# Patient Record
Sex: Female | Born: 1940 | Race: White | Hispanic: No | Marital: Married | Smoking: Never smoker
Health system: Southern US, Community
[De-identification: ages and names within clinical notes are randomized; demographics above are authoritative.]

## PROBLEM LIST (undated history)

## (undated) DIAGNOSIS — J189 Pneumonia, unspecified organism: Secondary | ICD-10-CM

## (undated) DIAGNOSIS — I4891 Unspecified atrial fibrillation: Secondary | ICD-10-CM

## (undated) DIAGNOSIS — D649 Anemia, unspecified: Secondary | ICD-10-CM

## (undated) DIAGNOSIS — G252 Other specified forms of tremor: Secondary | ICD-10-CM

## (undated) DIAGNOSIS — J9610 Chronic respiratory failure, unspecified whether with hypoxia or hypercapnia: Secondary | ICD-10-CM

## (undated) DIAGNOSIS — I509 Heart failure, unspecified: Secondary | ICD-10-CM

## (undated) DIAGNOSIS — G609 Hereditary and idiopathic neuropathy, unspecified: Secondary | ICD-10-CM

## (undated) DIAGNOSIS — E119 Type 2 diabetes mellitus without complications: Secondary | ICD-10-CM

## (undated) DIAGNOSIS — M1990 Unspecified osteoarthritis, unspecified site: Secondary | ICD-10-CM

## (undated) DIAGNOSIS — I890 Lymphedema, not elsewhere classified: Secondary | ICD-10-CM

## (undated) DIAGNOSIS — Z9989 Dependence on other enabling machines and devices: Secondary | ICD-10-CM

## (undated) DIAGNOSIS — R809 Proteinuria, unspecified: Secondary | ICD-10-CM

## (undated) DIAGNOSIS — D126 Benign neoplasm of colon, unspecified: Secondary | ICD-10-CM

## (undated) DIAGNOSIS — G4700 Insomnia, unspecified: Secondary | ICD-10-CM

## (undated) DIAGNOSIS — G2581 Restless legs syndrome: Secondary | ICD-10-CM

## (undated) DIAGNOSIS — N390 Urinary tract infection, site not specified: Secondary | ICD-10-CM

## (undated) DIAGNOSIS — G4733 Obstructive sleep apnea (adult) (pediatric): Secondary | ICD-10-CM

## (undated) DIAGNOSIS — J449 Chronic obstructive pulmonary disease, unspecified: Secondary | ICD-10-CM

## (undated) DIAGNOSIS — IMO0002 Reserved for concepts with insufficient information to code with codable children: Secondary | ICD-10-CM

## (undated) DIAGNOSIS — I10 Essential (primary) hypertension: Secondary | ICD-10-CM

## (undated) DIAGNOSIS — J9690 Respiratory failure, unspecified, unspecified whether with hypoxia or hypercapnia: Secondary | ICD-10-CM

## (undated) DIAGNOSIS — R609 Edema, unspecified: Secondary | ICD-10-CM

## (undated) DIAGNOSIS — Z9981 Dependence on supplemental oxygen: Secondary | ICD-10-CM

## (undated) DIAGNOSIS — E785 Hyperlipidemia, unspecified: Secondary | ICD-10-CM

## (undated) DIAGNOSIS — Z22322 Carrier or suspected carrier of Methicillin resistant Staphylococcus aureus: Secondary | ICD-10-CM

## (undated) HISTORY — DX: Restless legs syndrome: G25.81

## (undated) HISTORY — DX: Insomnia, unspecified: G47.00

## (undated) HISTORY — PX: SHOULDER ARTHROSCOPY W/ ROTATOR CUFF REPAIR: SHX2400

## (undated) HISTORY — DX: Carrier or suspected carrier of methicillin resistant Staphylococcus aureus: Z22.322

## (undated) HISTORY — PX: ABDOMINAL HYSTERECTOMY: SHX81

## (undated) HISTORY — DX: Urinary tract infection, site not specified: N39.0

## (undated) HISTORY — DX: Hereditary and idiopathic neuropathy, unspecified: G60.9

## (undated) HISTORY — DX: Unspecified atrial fibrillation: I48.91

## (undated) HISTORY — DX: Other specified forms of tremor: G25.2

## (undated) HISTORY — DX: Hyperlipidemia, unspecified: E78.5

## (undated) HISTORY — DX: Chronic respiratory failure, unspecified whether with hypoxia or hypercapnia: J96.10

## (undated) HISTORY — DX: Benign neoplasm of colon, unspecified: D12.6

## (undated) HISTORY — PX: LAPAROSCOPIC CHOLECYSTECTOMY: SUR755

## (undated) HISTORY — DX: Essential (primary) hypertension: I10

## (undated) HISTORY — PX: KNEE ARTHROSCOPY: SHX127

## (undated) HISTORY — DX: Reserved for concepts with insufficient information to code with codable children: IMO0002

## (undated) HISTORY — DX: Lymphedema, not elsewhere classified: I89.0

## (undated) HISTORY — DX: Pneumonia, unspecified organism: J18.9

## (undated) HISTORY — DX: Respiratory failure, unspecified, unspecified whether with hypoxia or hypercapnia: J96.90

## (undated) HISTORY — DX: Anemia, unspecified: D64.9

## (undated) HISTORY — PX: CATARACT EXTRACTION W/ INTRAOCULAR LENS  IMPLANT, BILATERAL: SHX1307

## (undated) HISTORY — DX: Heart failure, unspecified: I50.9

## (undated) HISTORY — DX: Proteinuria, unspecified: R80.9

## (undated) HISTORY — DX: Edema, unspecified: R60.9

---

## 2003-11-23 ENCOUNTER — Inpatient Hospital Stay (HOSPITAL_COMMUNITY): Admission: RE | Admit: 2003-11-23 | Discharge: 2003-11-27 | Payer: Self-pay | Admitting: Neurosurgery

## 2004-09-19 ENCOUNTER — Ambulatory Visit (HOSPITAL_COMMUNITY): Admission: RE | Admit: 2004-09-19 | Discharge: 2004-09-19 | Payer: Self-pay | Admitting: Neurosurgery

## 2004-10-03 ENCOUNTER — Ambulatory Visit (HOSPITAL_COMMUNITY): Admission: RE | Admit: 2004-10-03 | Discharge: 2004-10-04 | Payer: Self-pay | Admitting: Neurosurgery

## 2006-05-05 IMAGING — CR DG CHEST 2V
2 series · 2 of 2 positions shown · non-contrast
Comparison: none

CLINICAL DATA: Shortness of breath/pre-admit for lumbar laminectomy. 
TWO VIEW CHEST
No priors for comparison. 
Heart size upper normal. Lungs mildly hyperaerated but clear.  Slight peribronchial thickening. No active disease.                                          impression:no active disease.

[view not recorded (1 of 2)]
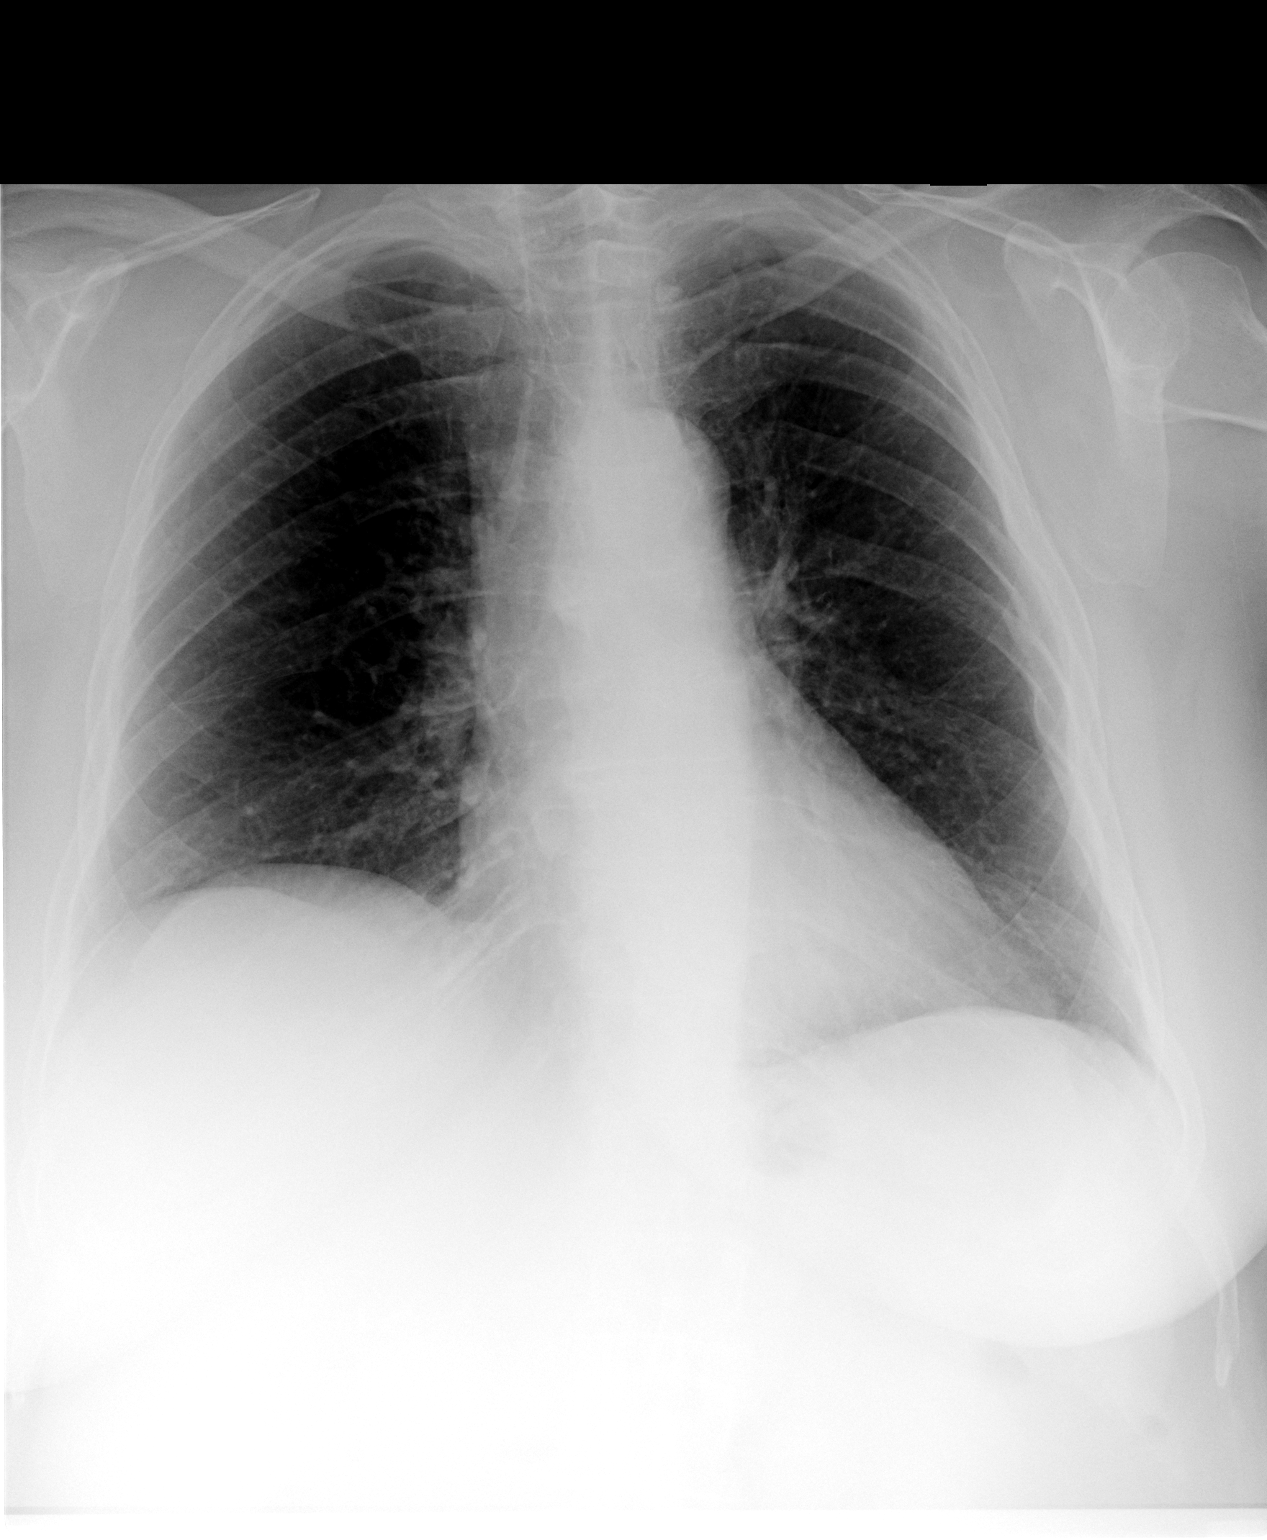

[view not recorded (2 of 2)]
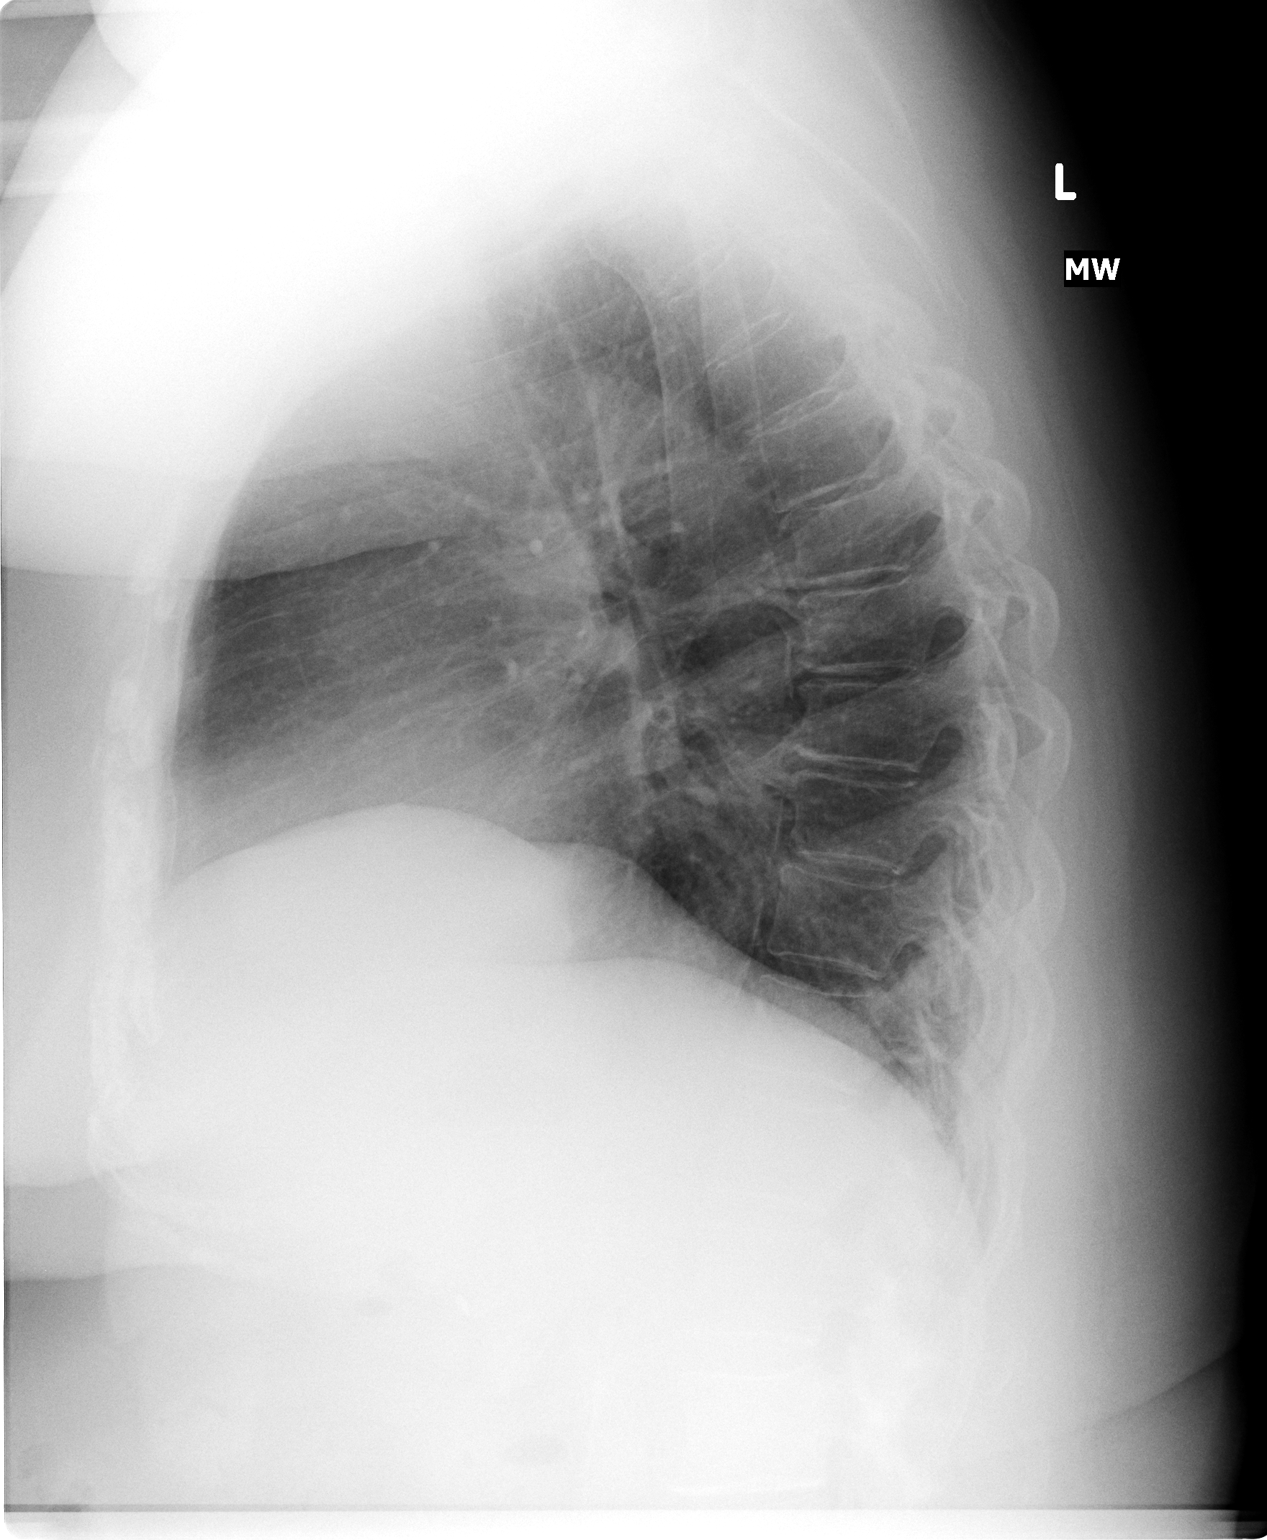

[2 of 2 positions shown; findings below may reference images not displayed]

## 2006-05-07 IMAGING — CR DG CHEST 1V PORT
1 series · 1 of 1 positions shown · non-contrast
Comparison: none

CLINICAL DATA: Central line placement.
 CHEST  PORTABLE 1 VIEW - 11/23/03 AT 6565 HOURS
 A right central line has been placed.  The tip is in the SVC.  No definite pneumothorax.  There are extremely low lung volumes with bibasilar opacities and perihilar opacities most likely volume overload/edema.
 IMPRESSION
 1.  Right central line tip in the SVC.  No pneumothorax.
 2.  Bilateral perihilar and lower lobe opacities most likely edema.

[view not recorded]
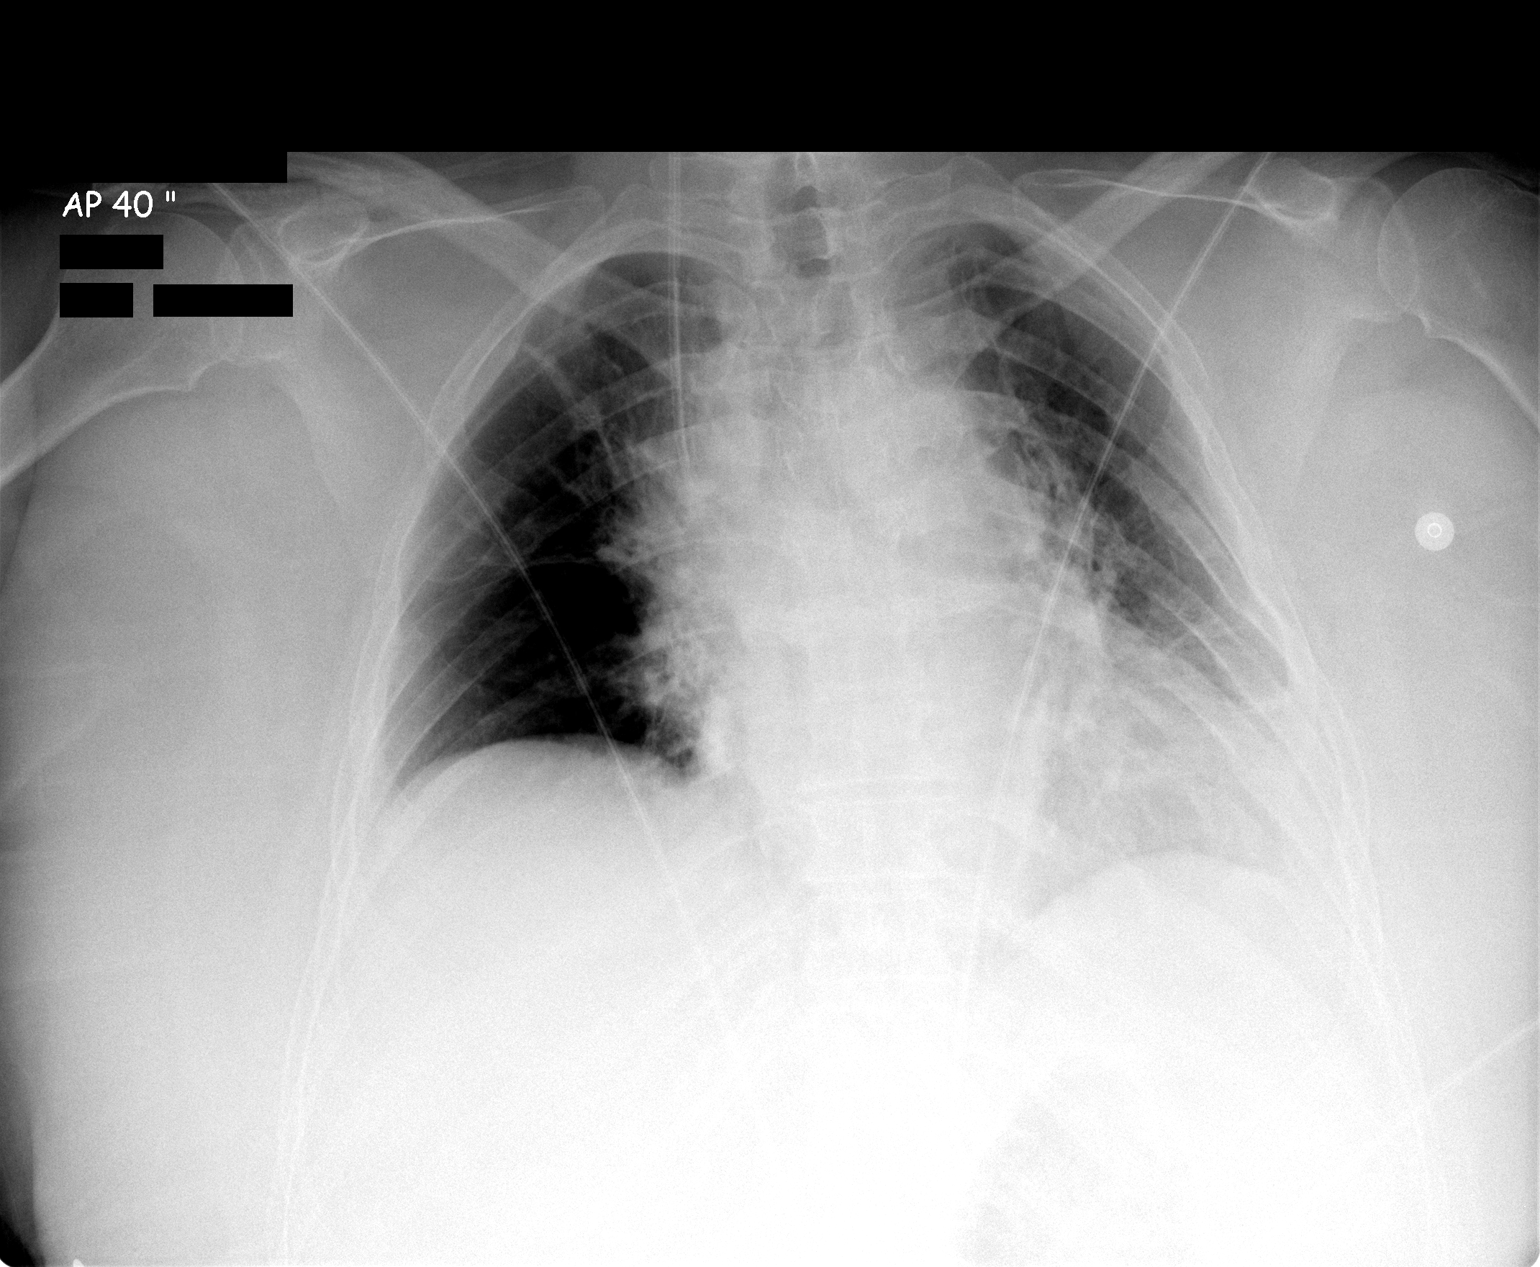

[1 of 1 positions shown; findings below may reference images not displayed]

## 2007-09-25 ENCOUNTER — Ambulatory Visit: Payer: Self-pay | Admitting: Vascular Surgery

## 2010-10-09 NOTE — Procedures (Signed)
LOWER EXTREMITY ARTERIAL EVALUATION-SINGLE LEVEL   INDICATION:  Bilateral leg pain.   HISTORY:  Diabetes:  Yes.  Cardiac:  No.  Hypertension:  Yes.  Smoking:  No.  Previous Surgery:  No.   RESTING SYSTOLIC PRESSURES: (ABI)                          RIGHT                LEFT  Brachial:               124                  130  Anterior tibial:        134                  98  Posterior tibial:       134 (>1.0)           110 (0.85)  Peroneal:  DOPPLER WAVEFORM ANALYSIS:  Anterior tibial:        Triphasic            Triphasic  Posterior tibial:       Triphasic            Triphasic  Peroneal:   PREVIOUS ABI'S:  Date:  RIGHT:  LEFT:   IMPRESSION:  1. No evidence of right lower extremity arterial occlusive disease.  2. Mild left arterial occlusive disease.  3. Right great toe pressure was 64 mmHg.  4. Left great toe pressure was 60 mmHg.  5. Patient unable to ambulate well enough to exercise.     ___________________________________________  Di Kindle. Edilia Bo, M.D.   DP/MEDQ  D:  09/25/2007  T:  09/25/2007  Job:  [id number]

## 2010-10-12 NOTE — Op Note (Signed)
NAMEANIJA, BRICKNER NO.:  [id number]   MEDICAL RECORD NO.:  [id number]          PATIENT TYPE:  OIB   LOCATION:  3025                         FACILITY:  [facility]   PHYSICIAN:  Coletta Memos, M.D.     DATE OF BIRTH:  07-22-1940   DATE OF PROCEDURE:  10/03/2004  DATE OF DISCHARGE:                                 OPERATIVE REPORT   PREOPERATIVE DIAGNOSIS:  1.  Displaced disk left C5-6.  2.  Left cervical radiculopathy.   POSTOPERATIVE DIAGNOSES:  1.  Displaced disk left C5-6.  2.  Left cervical radiculopathy.   PROCEDURE:  1.  Anterior cervical decompression C5-6.  2.  Arthrodesis C5-6, 6 mm Synthes ACS allograft.  3.  Anterior plating, 14 mm Synthes plating system, four 14-mm screws      placed.   COMPLICATIONS:  None.   SURGEON:  Coletta Memos, M.D.   ASSISTANT SURGEON:  Danae Orleans. Venetia Maxon, M.D.   ANESTHESIA:  General endotracheal.   ESTIMATED BLOOD LOSS:  Approximately 30 mL.   COMPLICATIONS:  None.   INDICATIONS:  Ms. Faelyn Sigler is a 64-year woman who has had severe pain  in left upper extremity. A myelogram and post myelogram CT was performed  showing a soft disk, what appeared to be a soft disk and the left C5-6  foramen. I recommended and she agreed to undergo operative decompression.   OPERATIVE NOTE:  Ms. Edds was brought to the operating room, intubated and  placed under general anesthetic. She was placed on a horseshoe with head in  essentially neutral position. 10 pounds of traction applied via chin strap.  The neck was prepped and she was draped in sterile fashion. I infiltrated 4  mL 0.5% lidocaine 1:200,000 strength epinephrine. Opened the skin with #10  blade starting from the midline extending to the medial border of the left  sternocleidomastoid muscle. I then identified the platysma and opened that  in a horizontal fashion. Dissecting both above and below the platysma. I  created more laxity in the skin. I then was able to go  through an avascular  cautery to the anterior cervical spine between the sternocleidomastoid and  medial strap muscles. The carotid artery was retracted laterally and the  strap muscles retracted medially. I reflected the longus colli muscles  bilaterally over the C5-6 disk space. This was done after I had to place the  needle and that showed the needle to be at the C3-4 space. Secondary to the  size of her breasts and shoulders it was not possible to see the C5-6 disk  space. Then counted down two disk spaces from that level and opened the disk  space. I placed distraction pins one at C5 and the other at C6 and self-  retaining retractors. I opened the disk space and with pituitary rongeurs,  curettes and a high-speed drill removed both osteophytes and endplate. Along  the disk was also removed. I then was able to fully decompress the left C6  nerve root with a Kerrison punch and a nerve hook. I removed disk material  and had a very nice  exposure of the nerve root in the lateral gutter. I  irrigated the wound. I did make sure that there was adequate decompression  on the right side though she had no complaints on that side. It was  spondylitic. I then placed a 6 mm bone into the disk space. I released the  retraction from the pins and removed the pins. The weight was taken off of  the neck. I then with Dr. Fredrich Birks assistance placed the plate two screws in  C5, two screws in C6 overlying the C5-6 disk space. I irrigated the wound  again. Then closed with a layered fashion using Vicryl sutures  reapproximating the platysma and subcutaneous tissue. An x-ray was performed  but the plate was not visible and I was unable to confirm my 5-6 location by  that x-ray. The patient tolerated the procedure well, moving all extremities  well.      KC/MEDQ  D:  10/03/2004  T:  10/04/2004  Job:  [id number]

## 2010-10-12 NOTE — Op Note (Signed)
NAMEMANA, HABERL NO.:  [id number]   MEDICAL RECORD NO.:  [id number]          PATIENT TYPE:  OIB   LOCATION:  3025                         FACILITY:  [facility]   PHYSICIAN:  Coletta Memos, M.D.     DATE OF BIRTH:  1940-10-08   DATE OF PROCEDURE:  10/03/2004  DATE OF DISCHARGE:  10/04/2004                                 OPERATIVE REPORT   PREOPERATIVE DIAGNOSIS:  Herniated nucleus pulposus, C5-C6, left.   POSTOPERATIVE DIAGNOSIS:  Herniated nucleus pulposus, C5-C6, left.   PROCEDURE:  Anterior cervical decompression C5-C6, arthrodesis C5-C6 with 6  mm ACF bone graft, Synthes plate, 14 mm using four screws.   COMPLICATIONS:  None.   SURGEON:  Coletta Memos, M.D.   ASSISTANT:  Danae Orleans. Venetia Maxon, M.D.   INDICATIONS FOR PROCEDURE:  Meagan Campbell is a 70 year old patient of mine  who was having pain in the left upper extremity.  Myelogram and post  myelogram CT revealed a herniated disc at C5-C6 on the left side.  I,  therefore, recommended and she agreed to undergo operative decompression.   OPERATIVE NOTE:  [name] was brought to the operating room, intubated,  and placed under general anesthesia without difficulty.  Her head was placed  in slight extension on the a horseshoe headrest with 10 pounds of traction  applied to the chin strap.  The neck was prepped and she was draped in a  sterile fashion.  I infiltrated 3 mL of 0.5% Marcaine 1:200,000 epinephrine  strength epinephrine into the cervical region of my incision starting at the  midline and extending to the medial border of the left sternocleidomastoid  muscle.  I opened the skin with a #10 blade, this was after the patient was  prepped and draped.  I then took this incision down to the platysma using  Metzenbaum scissors.  I dissected above the platysma rostral and caudal.  I  opened the platysma in a horizontal fashion and then I dissected rostral and  caudal inferior to the platysma.  I placed a  spinal needle and it showed  that I was at C3-C4.  I then moved down and I was able to localize the C5-C6  space.  I then opened the disc space with a #15 blade.  I then placed two  distraction pins, one at C5 and the other at C6.  I reflected the longus  colli muscles bilaterally.  I placed self-retaining retractor.  I brought  the microscope into position and proceeded with a discectomy at C5-C6.  I  used pituitary rongeurs, Epstein curets, high speed drill, to perform the  discectomy.  I then decompressed, fully, the C6 nerve roots bilaterally and  removed disc material on the left side at C5-C6.  I performed the  foraminotomy insuring that there was no compression on the nerve root.  I  then irrigated the wound profusely.  A 6 mm bone graft was placed and then I  removed the distraction pins.  I then placed an anterior plate, two screws  at C5, two screws at C6, with Dr. Fredrich Birks assistance.  I  then removed the  retractors, took an x-ray, and showed the  plate, plugs, and screws to be in good position.  I then irrigated the wound  once more.  I closed the wound in layered fashion using Vicryl sutures to  reapproximate the platysma and subcutaneous tissue and used Dermabond for a  sterile dressing.  The patient tolerated the procedure well.       KC/MEDQ  D:  11/30/2004  T:  11/30/2004  Job:  [id number]

## 2010-10-12 NOTE — Op Note (Signed)
NAMEICEL, CASTLES NO.:  [id number]   MEDICAL RECORD NO.:  [id number]                   PATIENT TYPE:  INP   LOCATION:  2899                                 FACILITY:  [facility]   PHYSICIAN:  Coletta Memos, M.D.                  DATE OF BIRTH:  [birth year]   DATE OF PROCEDURE:  11/23/2003  DATE OF DISCHARGE:                                 OPERATIVE REPORT   PREOPERATIVE DIAGNOSIS:  Lumbar stenosis, L2 to L5.  Lumbar radiculopathies.   POSTOPERATIVE DIAGNOSIS:  Lumbar stenosis, L2 to L5.  Lumbar  radiculopathies.   OPERATION PERFORMED:  L2-L5 hemilaminectomies, L3-L4 laminectomy.   SURGEON:  Coletta Memos, M.D.   ASSISTANT:  Payton Doughty, M.D.   ANESTHESIA:  General endotracheal.   COMPLICATIONS:  None.   INDICATIONS FOR PROCEDURE:  Meagan Campbell is a 70 year old woman who  presented with classic signs of neurogenic claudication, secondary to lumbar  stenosis.  I therefore recommended and she agreed to undergo operative  decompression.   DESCRIPTION OF PROCEDURE:  [name] was brought to the operating room,  intubated and placed under general anesthetic without difficulty.  I opened  the skin and exposed the lamina in the lumbar spine.  I placed the double-  ended ganglion knife inferior to it and x-ray showed me to be at the L3-4  interlaminar space.  I then exposed the lamina in bilateral fashion from L2  to L5.  I then proceeded with complete laminectomies of L3 and L4 using  Leksell rongeur, high speed air drill and Kerrison punches. The thecal sac  was exposed.  I then performed hemilaminectomies of L2 and L5 to ensure that  there was no compression either rostrally or caudally.  I then performed  foraminotomies for the L2 root, the L3 root, the L4 root and the L5 roots  bilaterally.  I irrigated the wound.  When this was done,  Dr. Channing Mutters who  assisted with the decompression assisted with closure.  We used Vicryl  sutures to reapproximate  the thoracolumbar fascia, subcutaneous tissues and  skin edges.                                               Coletta Memos, M.D.    KC/MEDQ  D:  11/23/2003  T:  11/23/2003  Job:  [id number]

## 2010-10-12 NOTE — Discharge Summary (Signed)
NAMEFINOLA, ROSAL NO.:  [id number]   MEDICAL RECORD NO.:  [id number]                   PATIENT TYPE:  INP   LOCATION:  3025                                 FACILITY:  [facility]   PHYSICIAN:  Hilda Lias, M.D.                DATE OF BIRTH:  02/10/1941   DATE OF ADMISSION:  11/23/2003  DATE OF DISCHARGE:  11/27/2003                                 DISCHARGE SUMMARY   ADMISSION DIAGNOSIS:  Lumbar stenosis L2 to L5.   FINAL DIAGNOSIS:  Lumbar stenosis L2 to L5.   CLINICAL HISTORY:  The patient was admitted by Dr. Franky Macho because of back  pain with radiation down both legs.  X-rays showed stenosis from L2 down to  L5.  Surgery was advised.   LABORATORY DATA:  Normal.   COURSE IN THE HOSPITAL:  The patient was taken to surgery, and bilateral 2,  3, 4, 5 laminectomy and decompression of spinal cord as well as the nerve  root was done by Dr. Franky Macho.  The patient at the beginning was having a bit  of fever, mostly secondary to atelectasis.  Today she afebrile for 48 hours.  She is ambulating.  The wound looks fine, and she wants to go home.   CONDITION ON DISCHARGE:  Improvement.   MEDICATIONS:  Demerol and Diazepam.   DIET:  Same as preop.   ACTIVITY:  Not to drive, not to bend.   FOLLOW UP:  She is going to call Dr. Franky Macho to see him in three to four  weeks.                                                Hilda Lias, M.D.    EB/MEDQ  D:  11/27/2003  T:  11/28/2003  Job:  [id number]

## 2013-08-09 ENCOUNTER — Encounter: Payer: Self-pay | Admitting: Critical Care Medicine

## 2013-08-09 ENCOUNTER — Encounter: Payer: Self-pay | Admitting: *Deleted

## 2013-08-10 ENCOUNTER — Encounter: Payer: Self-pay | Admitting: Critical Care Medicine

## 2013-08-10 ENCOUNTER — Ambulatory Visit (INDEPENDENT_AMBULATORY_CARE_PROVIDER_SITE_OTHER): Payer: Self-pay | Admitting: Critical Care Medicine

## 2013-08-10 VITALS — BP 138/56 | HR 70 | Temp 98.5°F | Ht 68.0 in | Wt 298.0 lb

## 2013-08-10 DIAGNOSIS — Z22322 Carrier or suspected carrier of Methicillin resistant Staphylococcus aureus: Secondary | ICD-10-CM | POA: Insufficient documentation

## 2013-08-10 DIAGNOSIS — IMO0002 Reserved for concepts with insufficient information to code with codable children: Secondary | ICD-10-CM | POA: Insufficient documentation

## 2013-08-10 DIAGNOSIS — G609 Hereditary and idiopathic neuropathy, unspecified: Secondary | ICD-10-CM | POA: Insufficient documentation

## 2013-08-10 DIAGNOSIS — N184 Chronic kidney disease, stage 4 (severe): Secondary | ICD-10-CM | POA: Insufficient documentation

## 2013-08-10 DIAGNOSIS — G4733 Obstructive sleep apnea (adult) (pediatric): Secondary | ICD-10-CM | POA: Insufficient documentation

## 2013-08-10 DIAGNOSIS — J9610 Chronic respiratory failure, unspecified whether with hypoxia or hypercapnia: Secondary | ICD-10-CM | POA: Insufficient documentation

## 2013-08-10 DIAGNOSIS — E1165 Type 2 diabetes mellitus with hyperglycemia: Secondary | ICD-10-CM

## 2013-08-10 DIAGNOSIS — E119 Type 2 diabetes mellitus without complications: Secondary | ICD-10-CM

## 2013-08-10 DIAGNOSIS — N390 Urinary tract infection, site not specified: Secondary | ICD-10-CM

## 2013-08-10 DIAGNOSIS — I5032 Chronic diastolic (congestive) heart failure: Secondary | ICD-10-CM | POA: Insufficient documentation

## 2013-08-10 DIAGNOSIS — I10 Essential (primary) hypertension: Secondary | ICD-10-CM

## 2013-08-10 DIAGNOSIS — I4891 Unspecified atrial fibrillation: Secondary | ICD-10-CM | POA: Insufficient documentation

## 2013-08-10 DIAGNOSIS — E785 Hyperlipidemia, unspecified: Secondary | ICD-10-CM | POA: Insufficient documentation

## 2013-08-10 DIAGNOSIS — E662 Morbid (severe) obesity with alveolar hypoventilation: Secondary | ICD-10-CM

## 2013-08-10 DIAGNOSIS — R809 Proteinuria, unspecified: Secondary | ICD-10-CM

## 2013-08-10 DIAGNOSIS — I509 Heart failure, unspecified: Secondary | ICD-10-CM

## 2013-08-10 DIAGNOSIS — J189 Pneumonia, unspecified organism: Secondary | ICD-10-CM | POA: Insufficient documentation

## 2013-08-10 DIAGNOSIS — D649 Anemia, unspecified: Secondary | ICD-10-CM | POA: Insufficient documentation

## 2013-08-10 DIAGNOSIS — E1129 Type 2 diabetes mellitus with other diabetic kidney complication: Secondary | ICD-10-CM | POA: Insufficient documentation

## 2013-08-10 DIAGNOSIS — G2581 Restless legs syndrome: Secondary | ICD-10-CM | POA: Insufficient documentation

## 2013-08-10 MED ORDER — FUROSEMIDE 40 MG PO TABS: ORAL_TABLET | ORAL | Status: DC

## 2013-08-10 NOTE — Progress Notes (Signed)
Subjective:    Patient ID: Meagan Campbell, female    DOB: 04/09/1941, 73 y.o.   MRN: [id number]  HPI Comments: Pt in hosp 06/2013 for PNA and on vent.  Pt went to [facility] for rehab.  Pt had a vent run 2 years ago as well. Pt fell, and worsened. Pt now on a walker. Pt has been on oxygen night time but now night as well, now 2L continuous   Shortness of Breath This is a chronic problem. The current episode started more than 1 year ago. The problem occurs constantly. The problem has been unchanged. Associated symptoms include leg swelling and orthopnea. Pertinent negatives include no abdominal pain, chest pain, claudication, coryza, ear pain, fever, headaches, hemoptysis, leg pain, neck pain, PND, rash, rhinorrhea, sore throat, sputum production, swollen glands, syncope, vomiting or wheezing. The symptoms are aggravated by any activity, lying flat and URIs. Risk factors: life long never smoker. She has tried beta agonist inhalers for the symptoms. The treatment provided moderate relief. Her past medical history is significant for a heart failure and pneumonia. There is no history of allergies, aspirin allergies, asthma, bronchiolitis, CAD, COPD, DVT, PE or a recent surgery.  Now on Bipap 14/7 full face mask, wears bipap every night  ABG on d/c 06/2013: pco2 62 po2 78     Review of Systems  Constitutional: Positive for appetite change, fatigue and unexpected weight change. Negative for fever, chills, diaphoresis and activity change.  HENT: Positive for congestion. Negative for dental problem, ear discharge, ear pain, facial swelling, hearing loss, mouth sores, nosebleeds, postnasal drip, rhinorrhea, sinus pressure, sneezing, sore throat, tinnitus, trouble swallowing and voice change.   Eyes: Negative for photophobia, discharge, itching and visual disturbance.  Respiratory: Positive for cough, chest tightness and shortness of breath. Negative for apnea, hemoptysis, sputum production, choking, wheezing  and stridor.   Cardiovascular: Positive for palpitations, orthopnea and leg swelling. Negative for chest pain, claudication, syncope and PND.  Gastrointestinal: Negative for nausea, vomiting, abdominal pain, constipation, blood in stool and abdominal distention.  Genitourinary: Negative for dysuria, urgency, frequency, hematuria, flank pain, decreased urine volume and difficulty urinating.  Musculoskeletal: Positive for gait problem. Negative for arthralgias, back pain, joint swelling, myalgias, neck pain and neck stiffness.  Skin: Negative for color change, pallor and rash.  Neurological: Positive for dizziness, tremors and weakness. Negative for seizures, syncope, speech difficulty, light-headedness, numbness and headaches.  Hematological: Negative for adenopathy. Does not bruise/bleed easily.  Psychiatric/Behavioral: Positive for sleep disturbance. Negative for confusion and agitation. The patient is nervous/anxious.        Objective:   Physical Exam Filed Vitals:   08/10/13 1523 08/10/13 1524  BP:  138/56  Pulse:  70  Temp:  98.5 F (36.9 C)  TempSrc:  Oral  Height:  _0  (1.727 m)  Weight:  135.172 kg (298 lb)  SpO2: 87% 91%    Gen: Pleasant, morbidly obese, in no distress,  normal affect  ENT: No lesions,  mouth clear,  oropharynx clear, no postnasal drip  Neck: No JVD, no TMG, no carotid bruits  Lungs: No use of accessory muscles, no dullness to percussion, decreased BS at bases  Cardiovascular: RRR, heart sounds normal, no murmur or gallops, +++ peripheral edema  Abdomen: soft and NT, no HSM,  BS normal  Musculoskeletal: No deformities, no cyanosis or clubbing  Neuro: alert, non focal  Skin: Warm, no lesions or rashes  No results found.        Assessment & Plan:  Obesity hypoventilation syndrome Combined OSA and obesity hypoventilation syndrome with recent PNA and associated acute on chronic resp failure, now improved. Note volume excess and CKD stage  IV Plan Increase furosemide to $RemoveBefor'80mg'stqHgGUWJosO$  AM $R'40mg'Jh$  at lunch $Remove'80mg'owzllzp$  PM for 3 days then reduce to $RemoveB'80mg'GqzXavOX$  twice daily Cont Bilevel and oxygen QHS A renal consultation would be in order No other medication changes Return 2 months   CKD (chronic kidney disease) stage 4, GFR 15-29 ml/min Stage IV CKD A renal consultation should be considered   Urinary tract infection Recent UTI  Per PCP  Chronic diastolic heart failure Recent acute on chronic diastolic CHF, EF now 30% , was lower in prior Echos  Plan Per cardiology and PCP Increase furosemide to $RemoveBefor'80mg'HqqcsgGuvLTK$  AM $R'40mg'Dy$  at lunch $Remove'80mg'QDfqaWx$  PM for 3 days then reduce to $RemoveB'80mg'rEProGtA$  twice daily   Atrial fibrillation Chronic Afib Per cardiology  Morbid obesity Obesity and DM poorly controlled Weight management advised    Updated Medication List Outpatient Encounter Prescriptions as of 08/10/2013  Medication Sig  . ACCU-CHEK AVIVA PLUS test strip Use as directed  . amiodarone (PACERONE) 200 MG tablet Take 200 mg by mouth daily.  Marland Kitchen amLODipine (NORVASC) 5 MG tablet Take 5 mg by mouth daily.  Marland Kitchen aspirin 325 MG tablet Take 325 mg by mouth daily.  . Blood Glucose Calibration (ACCU-CHEK AVIVA) SOLN Use as directed  . Blood Glucose Monitoring Suppl (ACCU-CHEK AVIVA PLUS) W/DEVICE KIT Use as directed  . carbidopa-levodopa (SINEMET IR) 25-250 MG per tablet Take 1 tablet by mouth 3 (three) times daily.  . ferrous sulfate 325 (65 FE) MG tablet Take 325 mg by mouth daily with breakfast.  . furosemide (LASIX) 40 MG tablet Increase to $RemoveBef'80mg'ZfRpHQdlxZ$  AM $R'40mg'Nu$  at lunch and $RemoveB'80mg'cLrRIHaf$  PM for 3 days then reduce back to $Remov'80mg'ExCNyC$  twice daily  . hydrALAZINE (APRESOLINE) 50 MG tablet Take 50 mg by mouth 3 (three) times daily.  Marland Kitchen HYDROcodone-acetaminophen (NORCO/VICODIN) 5-325 MG per tablet Take 1 tablet by mouth every 6 (six) hours as needed for moderate pain.  Marland Kitchen insulin aspart protamine- aspart (NOVOLOG MIX 70/30) (70-30) 100 UNIT/ML injection Inject 35 Units into the skin 2 (two) times daily with a meal.    . insulin regular (HUMULIN R) 100 units/mL injection Inject into the skin 3 (three) times daily before meals.  Marland Kitchen ipratropium-albuterol (DUONEB) 0.5-2.5 (3) MG/3ML SOLN Take 3 mLs by nebulization 2 (two) times daily.   . isosorbide mononitrate (IMDUR) 30 MG 24 hr tablet Take 30 mg by mouth daily.  . magnesium hydroxide (MILK OF MAGNESIA) 400 MG/5ML suspension Take 30 mLs by mouth daily as needed for mild constipation.  . magnesium oxide (MAG-OX) 400 MG tablet Take 400 mg by mouth 2 (two) times daily.  . Omega-3 Fatty Acids (FISH OIL CONCENTRATE PO) Take 2 capsules by mouth 2 (two) times daily.   . ondansetron (ZOFRAN) 4 MG tablet Take 1 tablet by mouth every 4 (four) hours as needed.  . pregabalin (LYRICA) 100 MG capsule Take 100 mg by mouth daily.   Marland Kitchen rOPINIRole (REQUIP) 4 MG tablet Take 4 mg by mouth at bedtime. 2 tabs at bed time  . simvastatin (ZOCOR) 40 MG tablet Take 40 mg by mouth daily.  Marland Kitchen ULTILET CLASSIC LANCETS MISC by Does not apply route.  . vitamin B-12 (CYANOCOBALAMIN) 1000 MCG tablet Take 1,000 mcg by mouth daily.  . [DISCONTINUED] furosemide (LASIX) 40 MG tablet Take 80 mg by mouth 2 (two) times daily.   . [DISCONTINUED] carvedilol (COREG)  6.25 MG tablet Take 6.25 mg by mouth 2 (two) times daily with a meal.  . [DISCONTINUED] meclizine (ANTIVERT) 25 MG tablet Take 25 mg by mouth 3 (three) times daily as needed for dizziness.  . [DISCONTINUED] Menthol, Topical Analgesic, (BIOFREEZE) 4 % GEL Apply topically.  . [DISCONTINUED] metolazone (ZAROXOLYN) 2.5 MG tablet Take 2.5 mg by mouth daily.  . [DISCONTINUED] omeprazole (PRILOSEC) 40 MG capsule Take 40 mg by mouth daily.

## 2013-08-10 NOTE — Patient Instructions (Signed)
A referral to a kidney physician may be beneficial, will discuss with Dr Delena Bali Increase furosemide to 80mg  AM 40mg  at lunch 80mg  PM for 3 days then reduce to 80mg  twice daily No other medication changes Return 2 months

## 2013-08-11 DIAGNOSIS — E662 Morbid (severe) obesity with alveolar hypoventilation: Secondary | ICD-10-CM | POA: Insufficient documentation

## 2013-08-11 NOTE — Assessment & Plan Note (Signed)
Combined OSA and obesity hypoventilation syndrome with recent PNA and associated acute on chronic resp failure, now improved. Note volume excess and CKD stage IV Plan Increase furosemide to 80mg  AM 40mg  at lunch 80mg  PM for 3 days then reduce to 80mg  twice daily Cont Bilevel and oxygen QHS A renal consultation would be in order No other medication changes Return 2 months

## 2013-08-11 NOTE — Assessment & Plan Note (Signed)
Obesity and DM poorly controlled Weight management advised

## 2013-08-11 NOTE — Assessment & Plan Note (Signed)
Chronic Afib Per cardiology

## 2013-08-11 NOTE — Assessment & Plan Note (Signed)
Recent acute on chronic diastolic CHF, EF now 23% , was lower in prior [facility] Per cardiology and PCP Increase furosemide to 80mg  AM 40mg  at lunch 80mg  PM for 3 days then reduce to 80mg  twice daily

## 2013-08-11 NOTE — Assessment & Plan Note (Signed)
Stage IV CKD A renal consultation should be considered

## 2013-08-11 NOTE — Assessment & Plan Note (Signed)
Recent UTI  Per PCP

## 2013-08-26 ENCOUNTER — Ambulatory Visit: Payer: Self-pay

## 2013-11-02 ENCOUNTER — Encounter: Payer: Self-pay | Admitting: Critical Care Medicine

## 2013-11-02 ENCOUNTER — Ambulatory Visit (INDEPENDENT_AMBULATORY_CARE_PROVIDER_SITE_OTHER): Payer: Self-pay | Admitting: Critical Care Medicine

## 2013-11-02 VITALS — BP 138/60 | HR 74 | Temp 97.1°F | Ht 67.0 in | Wt 295.6 lb

## 2013-11-02 DIAGNOSIS — N184 Chronic kidney disease, stage 4 (severe): Secondary | ICD-10-CM

## 2013-11-02 DIAGNOSIS — J9610 Chronic respiratory failure, unspecified whether with hypoxia or hypercapnia: Secondary | ICD-10-CM

## 2013-11-02 DIAGNOSIS — E662 Morbid (severe) obesity with alveolar hypoventilation: Secondary | ICD-10-CM

## 2013-11-02 NOTE — Assessment & Plan Note (Signed)
Chronic resp failure d/t obesity hypoventilation  Plan ABG and bmet  today Home health RN ordered No change in medications, cont lasix as ordered Renal evaluation would be ideal Return 2 months

## 2013-11-02 NOTE — Progress Notes (Signed)
Subjective:    Patient ID: Meagan Campbell, female    DOB: [birth year], 73 y.o.   MRN: [id number]  HPI Comments: Pt in hosp 06/2013 for PNA and on vent.  Pt went to [facility] for rehab.  Pt had a vent run 2 years ago as well. Pt fell, and worsened. Pt now on a walker. Pt has been on oxygen night time but now night as well, now 2L continuous  Now on Bipap 14/7 full face mask, wears bipap every night  ABG on d/c 06/2013: pco2 62 po2 78   11/02/2013 Chief Complaint  Patient presents with  . Follow-up    sob worse x 1 wk.,no cough,no wheezing, denies cp or tightness,increased ankle and legs   combined OSA and obesity hypoventilation syndrome with recent PNA and associated acute on chronic resp failure, now improved. Note volume excess and CKD stage IV Plan Increase furosemide to 29m AM 464mat lunch 8050mM for 3 days then reduce to 57m41mice daily Cont Bilevel and oxygen QHS Pt more dyspneic over the past week.  Legs more edematous.  No change in weight. No real cough. No renal consult yet obtained.  Pt does not want HD. No real chest pain On bilevel qhs and oxygen    Review of Systems  Constitutional: Positive for appetite change, fatigue and unexpected weight change. Negative for chills, diaphoresis and activity change.  HENT: Positive for congestion. Negative for dental problem, ear discharge, facial swelling, hearing loss, mouth sores, nosebleeds, postnasal drip, sinus pressure, sneezing, tinnitus, trouble swallowing and voice change.   Eyes: Negative for photophobia, discharge, itching and visual disturbance.  Respiratory: Positive for cough and chest tightness. Negative for apnea, choking and stridor.   Cardiovascular: Positive for palpitations.  Gastrointestinal: Negative for nausea, constipation, blood in stool and abdominal distention.  Genitourinary: Negative for dysuria, urgency, frequency, hematuria, flank pain, decreased urine volume and difficulty urinating.  Musculoskeletal:  Positive for gait problem. Negative for arthralgias, back pain, joint swelling, myalgias and neck stiffness.  Skin: Negative for color change and pallor.  Neurological: Positive for dizziness, tremors and weakness. Negative for seizures, syncope, speech difficulty, light-headedness and numbness.  Hematological: Negative for adenopathy. Does not bruise/bleed easily.  Psychiatric/Behavioral: Positive for sleep disturbance. Negative for confusion and agitation. The patient is nervous/anxious.        Objective:   Physical Exam  Filed Vitals:   11/02/13 1102  BP: 138/60  Pulse: 74  Temp: 97.1 F (36.2 C)  TempSrc: Oral  Height: _0  (1.702 m)  Weight: 295 lb 9.6 oz (134.083 kg)  SpO2: 92%    Gen: Pleasant, morbidly obese, in no distress,  normal affect  ENT: No lesions,  mouth clear,  oropharynx clear, no postnasal drip  Neck: No JVD, no TMG, no carotid bruits  Lungs: No use of accessory muscles, no dullness to percussion, decreased BS at bases  Cardiovascular: RRR, heart sounds normal, no murmur or gallops, +++ peripheral edema  Abdomen: soft and NT, no HSM,  BS normal  Musculoskeletal: No deformities, no cyanosis or clubbing  Neuro: alert, non focal  Skin: Warm, no lesions or rashes  No results found.        Assessment & Plan:   Chronic respiratory failure Chronic resp failure d/t obesity hypoventilation  Plan ABG and bmet  today Home health RN ordered No change in medications, cont lasix as ordered Renal evaluation would be ideal Return 2 months   CKD (chronic kidney disease) stage 4, GFR  15-29 ml/min Pt claims she does not desire Hemodialysis Needs renal evaluation    Updated Medication List Outpatient Encounter Prescriptions as of 11/02/2013  Medication Sig  . ACCU-CHEK AVIVA PLUS test strip Use as directed  . amiodarone (PACERONE) 200 MG tablet Take 200 mg by mouth daily.  Marland Kitchen amLODipine (NORVASC) 5 MG tablet Take 5 mg by mouth daily.  Marland Kitchen aspirin  325 MG tablet Take 325 mg by mouth daily.  . Blood Glucose Calibration (ACCU-CHEK AVIVA) SOLN Use as directed  . Blood Glucose Monitoring Suppl (ACCU-CHEK AVIVA PLUS) W/DEVICE KIT Use as directed  . carbidopa-levodopa (SINEMET IR) 25-250 MG per tablet Take 1 tablet by mouth 3 (three) times daily.  . ferrous sulfate 325 (65 FE) MG tablet Take 325 mg by mouth daily with breakfast.  . furosemide (LASIX) 40 MG tablet 80 mg 2 (two) times daily.  Marland Kitchen HYDROcodone-acetaminophen (NORCO/VICODIN) 5-325 MG per tablet Take 1 tablet by mouth every 6 (six) hours as needed for moderate pain.  Marland Kitchen insulin aspart protamine- aspart (NOVOLOG MIX 70/30) (70-30) 100 UNIT/ML injection Inject 40 Units into the skin 2 (two) times daily with a meal.   . insulin regular (HUMULIN R) 100 units/mL injection Inject into the skin 3 (three) times daily before meals.  Marland Kitchen ipratropium-albuterol (DUONEB) 0.5-2.5 (3) MG/3ML SOLN Take 3 mLs by nebulization 2 (two) times daily.   . isosorbide mononitrate (IMDUR) 30 MG 24 hr tablet Take 30 mg by mouth daily.  . magnesium hydroxide (MILK OF MAGNESIA) 400 MG/5ML suspension Take 30 mLs by mouth daily as needed for mild constipation.  . magnesium oxide (MAG-OX) 400 MG tablet Take 400 mg by mouth 2 (two) times daily.  . Omega-3 Fatty Acids (FISH OIL CONCENTRATE PO) Take 2 capsules by mouth 2 (two) times daily.   . ondansetron (ZOFRAN) 4 MG tablet Take 1 tablet by mouth every 4 (four) hours as needed.  . pregabalin (LYRICA) 100 MG capsule Take 100 mg by mouth daily.   Marland Kitchen rOPINIRole (REQUIP) 4 MG tablet Take 4 mg by mouth at bedtime. 2 tabs at bed time  . simvastatin (ZOCOR) 40 MG tablet Take 40 mg by mouth daily.  Marland Kitchen ULTILET CLASSIC LANCETS MISC by Does not apply route.  . zaleplon (SONATA) 10 MG capsule Take 10 mg by mouth at bedtime as needed.  . [DISCONTINUED] furosemide (LASIX) 40 MG tablet Increase to 36m AM 416mat lunch and 8060mM for 3 days then reduce back to 71m61mice daily  .  carvedilol (COREG) 6.25 MG tablet Take 6.25 mg by mouth 2 (two) times daily.  . gaMarland Kitchenapentin (NEURONTIN) 300 MG capsule Not started yet  . hydrALAZINE (APRESOLINE) 50 MG tablet Take 50 mg by mouth 3 (three) times daily.  . [DISCONTINUED] vitamin B-12 (CYANOCOBALAMIN) 1000 MCG tablet Take 1,000 mcg by mouth daily.

## 2013-11-02 NOTE — Patient Instructions (Signed)
ABG and labs today Home health RN ordered No change in medications Return 2 months

## 2013-11-02 NOTE — Assessment & Plan Note (Signed)
Pt claims she does not desire Hemodialysis Needs renal evaluation

## 2013-11-03 ENCOUNTER — Telehealth: Payer: Self-pay | Admitting: Critical Care Medicine

## 2013-11-03 NOTE — Telephone Encounter (Signed)
ATC line busy x 4 wcb 

## 2013-11-04 NOTE — Telephone Encounter (Signed)
Crystal, can you check to see what bmet result was : [employer]

## 2013-11-04 NOTE — Telephone Encounter (Signed)
Don't see any results in epic regarding lab work Please advise Dr. Joya Gaskins if you have any results on pt? thanks

## 2013-11-04 NOTE — Telephone Encounter (Signed)
Wants results of her labs [phone]

## 2013-11-04 NOTE — Telephone Encounter (Signed)
Will do  In the future it would help to make a note of key findings of the labs since [place] labs are faxed

## 2013-11-04 NOTE — Telephone Encounter (Signed)
I called spoke with pt. She is aware PW will be in [facility] office tomorrow and will let know results then. Please advise thanks

## 2013-11-04 NOTE — Telephone Encounter (Signed)
Results received and is in PW folder for review. Please advise Dr. Joya Gaskins thanks

## 2013-11-05 ENCOUNTER — Telehealth: Payer: Self-pay | Admitting: Critical Care Medicine

## 2013-11-05 NOTE — Telephone Encounter (Signed)
Will forward to Dr. Joya Gaskins and Donella Stade regarding results

## 2013-11-05 NOTE — Telephone Encounter (Signed)
Pt aware.

## 2013-11-09 NOTE — Telephone Encounter (Signed)
Opened in error

## 2013-11-24 DEATH — deceased

## 2013-11-25 ENCOUNTER — Encounter: Payer: Self-pay | Admitting: Critical Care Medicine

## 2013-12-30 ENCOUNTER — Ambulatory Visit: Payer: Self-pay

## 2014-01-04 ENCOUNTER — Ambulatory Visit: Payer: Self-pay | Admitting: Critical Care Medicine

## 2014-03-24 ENCOUNTER — Telehealth: Payer: Self-pay | Admitting: Critical Care Medicine

## 2014-03-24 NOTE — Telephone Encounter (Signed)
Received labs, 3 pages, from [place], sent to Dr. Joya Gaskins. 03/24/14/ss.

## 2014-05-13 ENCOUNTER — Encounter: Payer: Self-pay | Admitting: Critical Care Medicine

## 2014-05-31 ENCOUNTER — Ambulatory Visit: Payer: Self-pay | Admitting: Critical Care Medicine

## 2014-06-02 DIAGNOSIS — J449 Chronic obstructive pulmonary disease, unspecified: Secondary | ICD-10-CM | POA: Diagnosis not present

## 2014-06-02 DIAGNOSIS — J9600 Acute respiratory failure, unspecified whether with hypoxia or hypercapnia: Secondary | ICD-10-CM | POA: Diagnosis not present

## 2014-06-06 DIAGNOSIS — J449 Chronic obstructive pulmonary disease, unspecified: Secondary | ICD-10-CM | POA: Diagnosis not present

## 2014-06-16 DIAGNOSIS — J9811 Atelectasis: Secondary | ICD-10-CM | POA: Diagnosis not present

## 2014-06-16 DIAGNOSIS — I517 Cardiomegaly: Secondary | ICD-10-CM | POA: Diagnosis not present

## 2014-06-16 DIAGNOSIS — J449 Chronic obstructive pulmonary disease, unspecified: Secondary | ICD-10-CM | POA: Diagnosis not present

## 2014-06-24 DIAGNOSIS — I10 Essential (primary) hypertension: Secondary | ICD-10-CM | POA: Diagnosis not present

## 2014-06-24 DIAGNOSIS — D649 Anemia, unspecified: Secondary | ICD-10-CM | POA: Diagnosis not present

## 2014-06-24 DIAGNOSIS — N184 Chronic kidney disease, stage 4 (severe): Secondary | ICD-10-CM | POA: Diagnosis not present

## 2014-06-24 DIAGNOSIS — E213 Hyperparathyroidism, unspecified: Secondary | ICD-10-CM | POA: Diagnosis not present

## 2014-06-24 DIAGNOSIS — I509 Heart failure, unspecified: Secondary | ICD-10-CM | POA: Diagnosis not present

## 2014-06-27 DIAGNOSIS — E1129 Type 2 diabetes mellitus with other diabetic kidney complication: Secondary | ICD-10-CM | POA: Diagnosis not present

## 2014-06-27 DIAGNOSIS — E611 Iron deficiency: Secondary | ICD-10-CM | POA: Diagnosis not present

## 2014-06-27 DIAGNOSIS — I10 Essential (primary) hypertension: Secondary | ICD-10-CM | POA: Diagnosis not present

## 2014-06-27 DIAGNOSIS — Z79899 Other long term (current) drug therapy: Secondary | ICD-10-CM | POA: Diagnosis not present

## 2014-06-27 DIAGNOSIS — E785 Hyperlipidemia, unspecified: Secondary | ICD-10-CM | POA: Diagnosis not present

## 2014-06-28 ENCOUNTER — Other Ambulatory Visit: Payer: Self-pay | Admitting: Nephrology

## 2014-06-28 ENCOUNTER — Ambulatory Visit (INDEPENDENT_AMBULATORY_CARE_PROVIDER_SITE_OTHER): Payer: Self-pay | Admitting: Critical Care Medicine

## 2014-06-28 ENCOUNTER — Encounter: Payer: Self-pay | Admitting: Critical Care Medicine

## 2014-06-28 VITALS — BP 136/60 | HR 91 | Temp 97.6°F | Ht 68.0 in | Wt 288.0 lb

## 2014-06-28 DIAGNOSIS — G8929 Other chronic pain: Secondary | ICD-10-CM | POA: Insufficient documentation

## 2014-06-28 DIAGNOSIS — E669 Obesity, unspecified: Secondary | ICD-10-CM | POA: Insufficient documentation

## 2014-06-28 DIAGNOSIS — E662 Morbid (severe) obesity with alveolar hypoventilation: Secondary | ICD-10-CM | POA: Diagnosis not present

## 2014-06-28 DIAGNOSIS — E1149 Type 2 diabetes mellitus with other diabetic neurological complication: Secondary | ICD-10-CM | POA: Insufficient documentation

## 2014-06-28 DIAGNOSIS — E1165 Type 2 diabetes mellitus with hyperglycemia: Secondary | ICD-10-CM | POA: Insufficient documentation

## 2014-06-28 DIAGNOSIS — I10 Essential (primary) hypertension: Secondary | ICD-10-CM

## 2014-06-28 DIAGNOSIS — J9612 Chronic respiratory failure with hypercapnia: Secondary | ICD-10-CM | POA: Diagnosis not present

## 2014-06-28 DIAGNOSIS — IMO0002 Reserved for concepts with insufficient information to code with codable children: Secondary | ICD-10-CM | POA: Insufficient documentation

## 2014-06-28 DIAGNOSIS — M1710 Unilateral primary osteoarthritis, unspecified knee: Secondary | ICD-10-CM | POA: Insufficient documentation

## 2014-06-28 DIAGNOSIS — M5410 Radiculopathy, site unspecified: Secondary | ICD-10-CM | POA: Insufficient documentation

## 2014-06-28 DIAGNOSIS — D649 Anemia, unspecified: Secondary | ICD-10-CM

## 2014-06-28 DIAGNOSIS — I509 Heart failure, unspecified: Secondary | ICD-10-CM

## 2014-06-28 DIAGNOSIS — N184 Chronic kidney disease, stage 4 (severe): Secondary | ICD-10-CM

## 2014-06-28 DIAGNOSIS — M5136 Other intervertebral disc degeneration, lumbar region: Secondary | ICD-10-CM | POA: Insufficient documentation

## 2014-06-28 DIAGNOSIS — E1140 Type 2 diabetes mellitus with diabetic neuropathy, unspecified: Secondary | ICD-10-CM

## 2014-06-28 DIAGNOSIS — E1129 Type 2 diabetes mellitus with other diabetic kidney complication: Secondary | ICD-10-CM | POA: Diagnosis not present

## 2014-06-28 DIAGNOSIS — M179 Osteoarthritis of knee, unspecified: Secondary | ICD-10-CM | POA: Insufficient documentation

## 2014-06-28 NOTE — Progress Notes (Signed)
Subjective:    Patient ID: Meagan Campbell, female    DOB: Jul 18, 1940, 74 y.o.   MRN: [id number]  HPI Comments: Pt in hosp 06/2013 for PNA and on vent.  Pt went to [facility] for rehab.  Pt had a vent run 2 years ago as well. Pt fell, and worsened. Pt now on a walker. Pt has been on oxygen night time but now night as well, now 2L continuous  Now on Bipap 14/7 full face mask, wears bipap every night  ABG on d/c 06/2013: pco2 62 po2 78   06/28/2014 Chief Complaint  Patient presents with  . Follow-up    DOE unchanged, No cough, wheezing, or chest tightness/CP.  Notes DOE only.  Ok at rest.  No cough or mucus.  Still with edema.  Now on 169m bid lasix.  No potassium supp, pt with CKD Stage lV.  [facility] Pt just saw Dr GMoshe Cipro  Blood work was obtained.  No real chest pain  Still on bipap and O2 QHS.  Weight is the same.  Pt does not want HD.    Review of Systems  Constitutional: Positive for appetite change, fatigue and unexpected weight change. Negative for chills, diaphoresis and activity change.  HENT: Positive for congestion. Negative for dental problem, ear discharge, facial swelling, hearing loss, mouth sores, nosebleeds, postnasal drip, sinus pressure, sneezing, tinnitus, trouble swallowing and voice change.   Eyes: Negative for photophobia, discharge, itching and visual disturbance.  Respiratory: Positive for cough and chest tightness. Negative for apnea, choking and stridor.   Cardiovascular: Positive for palpitations.  Gastrointestinal: Negative for nausea, constipation, blood in stool and abdominal distention.  Genitourinary: Negative for dysuria, urgency, frequency, hematuria, flank pain, decreased urine volume and difficulty urinating.  Musculoskeletal: Positive for gait problem. Negative for myalgias, back pain, joint swelling, arthralgias and neck stiffness.  Skin: Negative for color change and pallor.  Neurological: Positive for dizziness,  tremors and weakness. Negative for seizures, syncope, speech difficulty, light-headedness and numbness.  Hematological: Negative for adenopathy. Does not bruise/bleed easily.  Psychiatric/Behavioral: Positive for sleep disturbance. Negative for confusion and agitation. The patient is nervous/anxious.        Objective:   Physical Exam Filed Vitals:   06/28/14 1147  BP: 136/60  Pulse: 91  Temp: 97.6 F (36.4 C)  TempSrc: Oral  Height: _0  (1.727 m)  Weight: 288 lb (130.636 kg)  SpO2: 91%    Gen: Pleasant, morbidly obese, in no distress,  normal affect  ENT: No lesions,  mouth clear,  oropharynx clear, no postnasal drip  Neck: No JVD, no TMG, no carotid bruits  Lungs: No use of accessory muscles, no dullness to percussion, decreased BS at bases  Cardiovascular: RRR, heart sounds normal, no murmur or gallops, +++ peripheral edema  Abdomen: soft and NT, no HSM,  BS normal  Musculoskeletal: No deformities, no cyanosis or clubbing  Neuro: alert, non focal  Skin: Warm, no lesions or rashes  No results found.  [facility] and were [facility]      Assessment & Plan:   Obesity hypoventilation syndrome Obesity hypoventilation syndrome with chronic hypercarbic respiratory failure secondary to morbid obesity and decreased central drive in association with stage IV chronic kidney disease.  Associated chronic diastolic heart failure compensated  Plan Maintain inhaled medications as prescribed Patient advised to continue to follow-up with nephrology Maintain bilevel therapy at night     Updated Medication List Outpatient Encounter Prescriptions as of 06/28/2014  Medication Sig  . ACCU-CHEK AVIVA PLUS test strip Use as directed  . amiodarone (PACERONE) 200 MG tablet Take 200 mg by mouth daily.  Marland Kitchen aspirin 325 MG tablet Take 325 mg by mouth daily.  . Blood Glucose Calibration (ACCU-CHEK AVIVA) SOLN Use as directed  . Blood Glucose Monitoring Suppl  (ACCU-CHEK AVIVA PLUS) W/DEVICE KIT Use as directed  . carbidopa-levodopa (SINEMET IR) 25-250 MG per tablet Take 1 tablet by mouth 3 (three) times daily.  . carvedilol (COREG) 6.25 MG tablet Take 6.25 mg by mouth 2 (two) times daily.  . ferrous sulfate 325 (65 FE) MG tablet Take 325 mg by mouth daily with breakfast.  . fosinopril (MONOPRIL) 40 MG tablet Take 40 mg by mouth daily.  . furosemide (LASIX) 80 MG tablet Take 120 mg by mouth 2 (two) times daily.  Marland Kitchen gabapentin (NEURONTIN) 300 MG capsule Take 300 mg by mouth daily.   . hydrALAZINE (APRESOLINE) 50 MG tablet Take 50 mg by mouth 3 (three) times daily.  Marland Kitchen HYDROcodone-acetaminophen (NORCO/VICODIN) 5-325 MG per tablet Take 1 tablet by mouth every 6 (six) hours as needed for moderate pain.  Marland Kitchen insulin aspart protamine- aspart (NOVOLOG MIX 70/30) (70-30) 100 UNIT/ML injection Inject 60 Units into the skin 2 (two) times daily with a meal.   . ipratropium-albuterol (DUONEB) 0.5-2.5 (3) MG/3ML SOLN Take 3 mLs by nebulization 2 (two) times daily.   . isosorbide mononitrate (IMDUR) 30 MG 24 hr tablet Take 30 mg by mouth daily.  . magnesium hydroxide (MILK OF MAGNESIA) 400 MG/5ML suspension Take 30 mLs by mouth daily as needed for mild constipation.  . magnesium oxide (MAG-OX) 400 MG tablet Take 400 mg by mouth 2 (two) times daily.  . Melatonin 5 MG TABS Take 1 tablet by mouth at bedtime.   Marland Kitchen nystatin (MYCOSTATIN/NYSTOP) 100000 UNIT/GM POWD as needed.  . Omega-3 Fatty Acids (FISH OIL CONCENTRATE PO) Take 2 capsules by mouth 2 (two) times daily.   . ondansetron (ZOFRAN) 4 MG tablet Take 1 tablet by mouth every 4 (four) hours as needed.  Marland Kitchen rOPINIRole (REQUIP) 4 MG tablet Take 4 mg by mouth at bedtime. 2 tabs at bed time  . simvastatin (ZOCOR) 40 MG tablet Take 40 mg by mouth daily.  Marland Kitchen ULTILET CLASSIC LANCETS MISC by Does not apply route.  . [DISCONTINUED] furosemide (LASIX) 40 MG tablet 80 mg 2 (two) times daily.  . [DISCONTINUED] insulin regular  (HUMULIN R) 100 units/mL injection Inject into the skin 3 (three) times daily before meals.  . [DISCONTINUED] meclizine (ANTIVERT) 25 MG tablet Take 1 tablet by mouth 2 (two) times daily.  . [DISCONTINUED] amLODipine (NORVASC) 5 MG tablet Take 5 mg by mouth daily.  . [DISCONTINUED] pregabalin (LYRICA) 100 MG capsule Take 100 mg by mouth daily.   . [DISCONTINUED] zaleplon (SONATA) 10 MG capsule Take 10 mg by mouth at bedtime as needed.

## 2014-06-28 NOTE — Patient Instructions (Signed)
No change in medications Return 6 months 

## 2014-06-28 NOTE — Assessment & Plan Note (Signed)
Obesity hypoventilation syndrome with chronic hypercarbic respiratory failure secondary to morbid obesity and decreased central drive in association with stage IV chronic kidney disease.  Associated chronic diastolic heart failure compensated  Plan Maintain inhaled medications as prescribed Patient advised to continue to follow-up with nephrology Maintain bilevel therapy at night

## 2014-07-03 DIAGNOSIS — J449 Chronic obstructive pulmonary disease, unspecified: Secondary | ICD-10-CM | POA: Diagnosis not present

## 2014-07-03 DIAGNOSIS — J9600 Acute respiratory failure, unspecified whether with hypoxia or hypercapnia: Secondary | ICD-10-CM | POA: Diagnosis not present

## 2014-07-04 ENCOUNTER — Telehealth: Payer: Self-pay | Admitting: Critical Care Medicine

## 2014-07-04 DIAGNOSIS — D509 Iron deficiency anemia, unspecified: Secondary | ICD-10-CM | POA: Diagnosis not present

## 2014-07-04 NOTE — Telephone Encounter (Signed)
Rec'd from [facility] forward 4 pages to Dr. Joya Gaskins

## 2014-07-06 DIAGNOSIS — J449 Chronic obstructive pulmonary disease, unspecified: Secondary | ICD-10-CM | POA: Diagnosis not present

## 2014-07-12 DIAGNOSIS — D649 Anemia, unspecified: Secondary | ICD-10-CM | POA: Diagnosis not present

## 2014-07-12 DIAGNOSIS — N184 Chronic kidney disease, stage 4 (severe): Secondary | ICD-10-CM | POA: Diagnosis not present

## 2014-07-12 DIAGNOSIS — I129 Hypertensive chronic kidney disease with stage 1 through stage 4 chronic kidney disease, or unspecified chronic kidney disease: Secondary | ICD-10-CM | POA: Diagnosis not present

## 2014-07-12 DIAGNOSIS — I509 Heart failure, unspecified: Secondary | ICD-10-CM | POA: Diagnosis not present

## 2014-07-14 DIAGNOSIS — D509 Iron deficiency anemia, unspecified: Secondary | ICD-10-CM | POA: Diagnosis not present

## 2014-07-19 DIAGNOSIS — R092 Respiratory arrest: Secondary | ICD-10-CM | POA: Diagnosis not present

## 2014-07-19 DIAGNOSIS — I440 Atrioventricular block, first degree: Secondary | ICD-10-CM | POA: Diagnosis not present

## 2014-07-19 DIAGNOSIS — J9811 Atelectasis: Secondary | ICD-10-CM | POA: Diagnosis not present

## 2014-07-19 DIAGNOSIS — J90 Pleural effusion, not elsewhere classified: Secondary | ICD-10-CM | POA: Diagnosis not present

## 2014-07-19 DIAGNOSIS — J690 Pneumonitis due to inhalation of food and vomit: Secondary | ICD-10-CM | POA: Diagnosis not present

## 2014-07-19 DIAGNOSIS — J441 Chronic obstructive pulmonary disease with (acute) exacerbation: Secondary | ICD-10-CM | POA: Diagnosis not present

## 2014-07-19 DIAGNOSIS — J9600 Acute respiratory failure, unspecified whether with hypoxia or hypercapnia: Secondary | ICD-10-CM | POA: Diagnosis not present

## 2014-07-19 DIAGNOSIS — D696 Thrombocytopenia, unspecified: Secondary | ICD-10-CM | POA: Diagnosis not present

## 2014-07-19 DIAGNOSIS — I8311 Varicose veins of right lower extremity with inflammation: Secondary | ICD-10-CM | POA: Diagnosis not present

## 2014-07-19 DIAGNOSIS — Z9911 Dependence on respirator [ventilator] status: Secondary | ICD-10-CM | POA: Diagnosis not present

## 2014-07-19 DIAGNOSIS — I129 Hypertensive chronic kidney disease with stage 1 through stage 4 chronic kidney disease, or unspecified chronic kidney disease: Secondary | ICD-10-CM | POA: Diagnosis not present

## 2014-07-19 DIAGNOSIS — Z6841 Body Mass Index (BMI) 40.0 and over, adult: Secondary | ICD-10-CM | POA: Diagnosis not present

## 2014-07-19 DIAGNOSIS — R0602 Shortness of breath: Secondary | ICD-10-CM | POA: Diagnosis not present

## 2014-07-19 DIAGNOSIS — D638 Anemia in other chronic diseases classified elsewhere: Secondary | ICD-10-CM | POA: Diagnosis not present

## 2014-07-19 DIAGNOSIS — E1129 Type 2 diabetes mellitus with other diabetic kidney complication: Secondary | ICD-10-CM | POA: Diagnosis not present

## 2014-07-19 DIAGNOSIS — Z4682 Encounter for fitting and adjustment of non-vascular catheter: Secondary | ICD-10-CM | POA: Diagnosis not present

## 2014-07-19 DIAGNOSIS — I252 Old myocardial infarction: Secondary | ICD-10-CM | POA: Diagnosis not present

## 2014-07-19 DIAGNOSIS — Z951 Presence of aortocoronary bypass graft: Secondary | ICD-10-CM | POA: Diagnosis not present

## 2014-07-19 DIAGNOSIS — J9621 Acute and chronic respiratory failure with hypoxia: Secondary | ICD-10-CM | POA: Diagnosis not present

## 2014-07-19 DIAGNOSIS — N179 Acute kidney failure, unspecified: Secondary | ICD-10-CM | POA: Diagnosis not present

## 2014-07-19 DIAGNOSIS — J159 Unspecified bacterial pneumonia: Secondary | ICD-10-CM | POA: Diagnosis not present

## 2014-07-19 DIAGNOSIS — F418 Other specified anxiety disorders: Secondary | ICD-10-CM | POA: Diagnosis not present

## 2014-07-19 DIAGNOSIS — I25810 Atherosclerosis of coronary artery bypass graft(s) without angina pectoris: Secondary | ICD-10-CM | POA: Diagnosis not present

## 2014-07-19 DIAGNOSIS — B961 Klebsiella pneumoniae [K. pneumoniae] as the cause of diseases classified elsewhere: Secondary | ICD-10-CM | POA: Diagnosis not present

## 2014-07-19 DIAGNOSIS — J440 Chronic obstructive pulmonary disease with acute lower respiratory infection: Secondary | ICD-10-CM | POA: Diagnosis not present

## 2014-07-19 DIAGNOSIS — N183 Chronic kidney disease, stage 3 (moderate): Secondary | ICD-10-CM | POA: Diagnosis not present

## 2014-07-19 DIAGNOSIS — I517 Cardiomegaly: Secondary | ICD-10-CM | POA: Diagnosis not present

## 2014-07-19 DIAGNOSIS — N390 Urinary tract infection, site not specified: Secondary | ICD-10-CM | POA: Diagnosis not present

## 2014-07-19 DIAGNOSIS — G9341 Metabolic encephalopathy: Secondary | ICD-10-CM | POA: Diagnosis not present

## 2014-07-19 DIAGNOSIS — G4733 Obstructive sleep apnea (adult) (pediatric): Secondary | ICD-10-CM | POA: Diagnosis not present

## 2014-07-19 DIAGNOSIS — Z794 Long term (current) use of insulin: Secondary | ICD-10-CM | POA: Diagnosis not present

## 2014-07-19 DIAGNOSIS — I5031 Acute diastolic (congestive) heart failure: Secondary | ICD-10-CM | POA: Diagnosis not present

## 2014-07-19 DIAGNOSIS — J449 Chronic obstructive pulmonary disease, unspecified: Secondary | ICD-10-CM | POA: Diagnosis not present

## 2014-07-19 DIAGNOSIS — E780 Pure hypercholesterolemia: Secondary | ICD-10-CM | POA: Diagnosis not present

## 2014-07-19 DIAGNOSIS — M7989 Other specified soft tissue disorders: Secondary | ICD-10-CM | POA: Diagnosis not present

## 2014-07-19 DIAGNOSIS — I498 Other specified cardiac arrhythmias: Secondary | ICD-10-CM | POA: Diagnosis not present

## 2014-07-19 DIAGNOSIS — I4510 Unspecified right bundle-branch block: Secondary | ICD-10-CM | POA: Diagnosis not present

## 2014-07-19 DIAGNOSIS — I5023 Acute on chronic systolic (congestive) heart failure: Secondary | ICD-10-CM | POA: Diagnosis not present

## 2014-07-19 DIAGNOSIS — R001 Bradycardia, unspecified: Secondary | ICD-10-CM | POA: Diagnosis not present

## 2014-07-19 DIAGNOSIS — M1990 Unspecified osteoarthritis, unspecified site: Secondary | ICD-10-CM | POA: Diagnosis not present

## 2014-07-19 DIAGNOSIS — J9602 Acute respiratory failure with hypercapnia: Secondary | ICD-10-CM | POA: Diagnosis not present

## 2014-07-19 DIAGNOSIS — J9690 Respiratory failure, unspecified, unspecified whether with hypoxia or hypercapnia: Secondary | ICD-10-CM | POA: Diagnosis not present

## 2014-07-19 DIAGNOSIS — R0600 Dyspnea, unspecified: Secondary | ICD-10-CM | POA: Diagnosis not present

## 2014-07-19 DIAGNOSIS — E662 Morbid (severe) obesity with alveolar hypoventilation: Secondary | ICD-10-CM | POA: Diagnosis not present

## 2014-08-04 ENCOUNTER — Inpatient Hospital Stay
Admission: AD | Admit: 2014-08-04 | Payer: Medicare Other | Source: Other Acute Inpatient Hospital | Admitting: Pulmonary Disease

## 2014-08-05 ENCOUNTER — Inpatient Hospital Stay (HOSPITAL_COMMUNITY)
Admission: EM | Admit: 2014-08-05 | Discharge: 2014-08-12 | DRG: 208 | Disposition: A | Discharge status: Rehabilitation Facility | Payer: Medicare Other | Source: Other Acute Inpatient Hospital | Attending: Internal Medicine | Admitting: Internal Medicine

## 2014-08-05 ENCOUNTER — Inpatient Hospital Stay (HOSPITAL_COMMUNITY): Payer: Medicare Other

## 2014-08-05 DIAGNOSIS — G7281 Critical illness myopathy: Secondary | ICD-10-CM | POA: Diagnosis not present

## 2014-08-05 DIAGNOSIS — G2581 Restless legs syndrome: Secondary | ICD-10-CM | POA: Diagnosis present

## 2014-08-05 DIAGNOSIS — I5033 Acute on chronic diastolic (congestive) heart failure: Secondary | ICD-10-CM | POA: Diagnosis present

## 2014-08-05 DIAGNOSIS — E1142 Type 2 diabetes mellitus with diabetic polyneuropathy: Secondary | ICD-10-CM | POA: Diagnosis present

## 2014-08-05 DIAGNOSIS — G8929 Other chronic pain: Secondary | ICD-10-CM | POA: Diagnosis not present

## 2014-08-05 DIAGNOSIS — R1110 Vomiting, unspecified: Secondary | ICD-10-CM | POA: Diagnosis present

## 2014-08-05 DIAGNOSIS — Z79899 Other long term (current) drug therapy: Secondary | ICD-10-CM

## 2014-08-05 DIAGNOSIS — J9811 Atelectasis: Secondary | ICD-10-CM | POA: Diagnosis present

## 2014-08-05 DIAGNOSIS — I129 Hypertensive chronic kidney disease with stage 1 through stage 4 chronic kidney disease, or unspecified chronic kidney disease: Secondary | ICD-10-CM | POA: Diagnosis not present

## 2014-08-05 DIAGNOSIS — N189 Chronic kidney disease, unspecified: Secondary | ICD-10-CM | POA: Diagnosis not present

## 2014-08-05 DIAGNOSIS — Z794 Long term (current) use of insulin: Secondary | ICD-10-CM

## 2014-08-05 DIAGNOSIS — Z885 Allergy status to narcotic agent status: Secondary | ICD-10-CM | POA: Diagnosis not present

## 2014-08-05 DIAGNOSIS — Z9049 Acquired absence of other specified parts of digestive tract: Secondary | ICD-10-CM | POA: Diagnosis present

## 2014-08-05 DIAGNOSIS — E876 Hypokalemia: Secondary | ICD-10-CM | POA: Diagnosis not present

## 2014-08-05 DIAGNOSIS — G4733 Obstructive sleep apnea (adult) (pediatric): Secondary | ICD-10-CM | POA: Diagnosis not present

## 2014-08-05 DIAGNOSIS — I482 Chronic atrial fibrillation: Secondary | ICD-10-CM

## 2014-08-05 DIAGNOSIS — I480 Paroxysmal atrial fibrillation: Secondary | ICD-10-CM | POA: Diagnosis not present

## 2014-08-05 DIAGNOSIS — G252 Other specified forms of tremor: Secondary | ICD-10-CM | POA: Diagnosis not present

## 2014-08-05 DIAGNOSIS — Z22322 Carrier or suspected carrier of Methicillin resistant Staphylococcus aureus: Secondary | ICD-10-CM

## 2014-08-05 DIAGNOSIS — I252 Old myocardial infarction: Secondary | ICD-10-CM

## 2014-08-05 DIAGNOSIS — Z8614 Personal history of Methicillin resistant Staphylococcus aureus infection: Secondary | ICD-10-CM

## 2014-08-05 DIAGNOSIS — Z9289 Personal history of other medical treatment: Secondary | ICD-10-CM

## 2014-08-05 DIAGNOSIS — E1122 Type 2 diabetes mellitus with diabetic chronic kidney disease: Secondary | ICD-10-CM | POA: Diagnosis present

## 2014-08-05 DIAGNOSIS — G609 Hereditary and idiopathic neuropathy, unspecified: Secondary | ICD-10-CM | POA: Diagnosis present

## 2014-08-05 DIAGNOSIS — Z7982 Long term (current) use of aspirin: Secondary | ICD-10-CM

## 2014-08-05 DIAGNOSIS — M1990 Unspecified osteoarthritis, unspecified site: Secondary | ICD-10-CM | POA: Diagnosis not present

## 2014-08-05 DIAGNOSIS — R4182 Altered mental status, unspecified: Secondary | ICD-10-CM | POA: Diagnosis present

## 2014-08-05 DIAGNOSIS — Z66 Do not resuscitate: Secondary | ICD-10-CM | POA: Diagnosis not present

## 2014-08-05 DIAGNOSIS — N390 Urinary tract infection, site not specified: Secondary | ICD-10-CM | POA: Diagnosis not present

## 2014-08-05 DIAGNOSIS — D649 Anemia, unspecified: Secondary | ICD-10-CM | POA: Diagnosis present

## 2014-08-05 DIAGNOSIS — I4891 Unspecified atrial fibrillation: Secondary | ICD-10-CM | POA: Diagnosis not present

## 2014-08-05 DIAGNOSIS — J9621 Acute and chronic respiratory failure with hypoxia: Secondary | ICD-10-CM | POA: Diagnosis present

## 2014-08-05 DIAGNOSIS — J449 Chronic obstructive pulmonary disease, unspecified: Secondary | ICD-10-CM | POA: Diagnosis present

## 2014-08-05 DIAGNOSIS — J9610 Chronic respiratory failure, unspecified whether with hypoxia or hypercapnia: Secondary | ICD-10-CM | POA: Diagnosis present

## 2014-08-05 DIAGNOSIS — J9602 Acute respiratory failure with hypercapnia: Secondary | ICD-10-CM

## 2014-08-05 DIAGNOSIS — E039 Hypothyroidism, unspecified: Secondary | ICD-10-CM | POA: Diagnosis present

## 2014-08-05 DIAGNOSIS — R0602 Shortness of breath: Secondary | ICD-10-CM | POA: Diagnosis not present

## 2014-08-05 DIAGNOSIS — I10 Essential (primary) hypertension: Secondary | ICD-10-CM | POA: Diagnosis present

## 2014-08-05 DIAGNOSIS — Z6841 Body Mass Index (BMI) 40.0 and over, adult: Secondary | ICD-10-CM

## 2014-08-05 DIAGNOSIS — I5031 Acute diastolic (congestive) heart failure: Secondary | ICD-10-CM | POA: Diagnosis not present

## 2014-08-05 DIAGNOSIS — E1165 Type 2 diabetes mellitus with hyperglycemia: Secondary | ICD-10-CM | POA: Diagnosis present

## 2014-08-05 DIAGNOSIS — E662 Morbid (severe) obesity with alveolar hypoventilation: Secondary | ICD-10-CM | POA: Diagnosis not present

## 2014-08-05 DIAGNOSIS — J9622 Acute and chronic respiratory failure with hypercapnia: Secondary | ICD-10-CM | POA: Diagnosis not present

## 2014-08-05 DIAGNOSIS — N184 Chronic kidney disease, stage 4 (severe): Secondary | ICD-10-CM | POA: Diagnosis present

## 2014-08-05 DIAGNOSIS — Z9981 Dependence on supplemental oxygen: Secondary | ICD-10-CM | POA: Diagnosis not present

## 2014-08-05 DIAGNOSIS — N179 Acute kidney failure, unspecified: Secondary | ICD-10-CM | POA: Diagnosis not present

## 2014-08-05 DIAGNOSIS — I2510 Atherosclerotic heart disease of native coronary artery without angina pectoris: Secondary | ICD-10-CM | POA: Diagnosis not present

## 2014-08-05 DIAGNOSIS — J90 Pleural effusion, not elsewhere classified: Secondary | ICD-10-CM | POA: Diagnosis not present

## 2014-08-05 DIAGNOSIS — N183 Chronic kidney disease, stage 3 (moderate): Secondary | ICD-10-CM | POA: Diagnosis not present

## 2014-08-05 DIAGNOSIS — D638 Anemia in other chronic diseases classified elsewhere: Secondary | ICD-10-CM | POA: Diagnosis not present

## 2014-08-05 DIAGNOSIS — F419 Anxiety disorder, unspecified: Secondary | ICD-10-CM | POA: Diagnosis not present

## 2014-08-05 DIAGNOSIS — IMO0002 Reserved for concepts with insufficient information to code with codable children: Secondary | ICD-10-CM | POA: Diagnosis present

## 2014-08-05 DIAGNOSIS — R627 Adult failure to thrive: Secondary | ICD-10-CM | POA: Diagnosis present

## 2014-08-05 DIAGNOSIS — R601 Generalized edema: Secondary | ICD-10-CM | POA: Diagnosis not present

## 2014-08-05 DIAGNOSIS — Z90710 Acquired absence of both cervix and uterus: Secondary | ICD-10-CM | POA: Diagnosis not present

## 2014-08-05 DIAGNOSIS — E785 Hyperlipidemia, unspecified: Secondary | ICD-10-CM | POA: Diagnosis present

## 2014-08-05 DIAGNOSIS — J9690 Respiratory failure, unspecified, unspecified whether with hypoxia or hypercapnia: Secondary | ICD-10-CM | POA: Diagnosis not present

## 2014-08-05 DIAGNOSIS — E1129 Type 2 diabetes mellitus with other diabetic kidney complication: Secondary | ICD-10-CM | POA: Diagnosis not present

## 2014-08-05 DIAGNOSIS — Z86010 Personal history of colonic polyps: Secondary | ICD-10-CM

## 2014-08-05 DIAGNOSIS — I5032 Chronic diastolic (congestive) heart failure: Secondary | ICD-10-CM | POA: Diagnosis not present

## 2014-08-05 DIAGNOSIS — R809 Proteinuria, unspecified: Secondary | ICD-10-CM | POA: Diagnosis present

## 2014-08-05 DIAGNOSIS — Z833 Family history of diabetes mellitus: Secondary | ICD-10-CM | POA: Diagnosis not present

## 2014-08-05 DIAGNOSIS — R531 Weakness: Secondary | ICD-10-CM | POA: Diagnosis not present

## 2014-08-05 HISTORY — DX: Dependence on other enabling machines and devices: Z99.89

## 2014-08-05 HISTORY — DX: Type 2 diabetes mellitus without complications: E11.9

## 2014-08-05 HISTORY — DX: Unspecified osteoarthritis, unspecified site: M19.90

## 2014-08-05 HISTORY — DX: Chronic obstructive pulmonary disease, unspecified: J44.9

## 2014-08-05 HISTORY — DX: Dependence on supplemental oxygen: Z99.81

## 2014-08-05 HISTORY — DX: Obstructive sleep apnea (adult) (pediatric): G47.33

## 2014-08-05 LAB — POCT I-STAT 3, ART BLOOD GAS (G3+)
Acid-Base Excess: 15 mmol/L — ABNORMAL HIGH (ref 0.0–2.0)
Bicarbonate: 37.9 mEq/L — ABNORMAL HIGH (ref 20.0–24.0)
O2 SAT: 94 %
TCO2: 39 mmol/L (ref 0–100)
pCO2 arterial: 39.2 mmHg (ref 35.0–45.0)
pH, Arterial: 7.594 — ABNORMAL HIGH (ref 7.350–7.450)
pO2, Arterial: 61 mmHg — ABNORMAL LOW (ref 80.0–100.0)

## 2014-08-05 LAB — COMPREHENSIVE METABOLIC PANEL
ALK PHOS: 94 U/L (ref 39–117)
ALT: 14 U/L (ref 0–35)
AST: 26 U/L (ref 0–37)
Albumin: 2.6 g/dL — ABNORMAL LOW (ref 3.5–5.2)
Anion gap: 13 (ref 5–15)
BUN: 28 mg/dL — AB (ref 6–23)
CHLORIDE: 90 mmol/L — AB (ref 96–112)
CO2: 37 mmol/L — ABNORMAL HIGH (ref 19–32)
Calcium: 8.9 mg/dL (ref 8.4–10.5)
Creatinine, Ser: 2.46 mg/dL — ABNORMAL HIGH (ref 0.50–1.10)
GFR calc non Af Amer: 18 mL/min — ABNORMAL LOW (ref >90)
GFR, EST AFRICAN AMERICAN: 21 mL/min — AB (ref >90)
GLUCOSE: 139 mg/dL — AB (ref 70–99)
Potassium: 3.6 mmol/L (ref 3.5–5.1)
SODIUM: 140 mmol/L (ref 135–145)
TOTAL PROTEIN: 6.1 g/dL (ref 6.0–8.3)
Total Bilirubin: 1.2 mg/dL (ref 0.3–1.2)

## 2014-08-05 LAB — GLUCOSE, CAPILLARY
GLUCOSE-CAPILLARY: 125 mg/dL — AB (ref 70–99)
Glucose-Capillary: 141 mg/dL — ABNORMAL HIGH (ref 70–99)
Glucose-Capillary: 151 mg/dL — ABNORMAL HIGH (ref 70–99)
Glucose-Capillary: 121 mg/dL — ABNORMAL HIGH (ref 70–99)

## 2014-08-05 LAB — CBC
HCT: 33 % — ABNORMAL LOW (ref 36.0–46.0)
Hemoglobin: 9.8 g/dL — ABNORMAL LOW (ref 12.0–15.0)
MCH: 26.2 pg (ref 26.0–34.0)
MCHC: 29.7 g/dL — ABNORMAL LOW (ref 30.0–36.0)
MCV: 88.2 fL (ref 78.0–100.0)
PLATELETS: 205 10*3/uL (ref 150–400)
RBC: 3.74 MIL/uL — ABNORMAL LOW (ref 3.87–5.11)
RDW: 19 % — ABNORMAL HIGH (ref 11.5–15.5)
WBC: 6.7 10*3/uL (ref 4.0–10.5)

## 2014-08-05 LAB — URINALYSIS, ROUTINE W REFLEX MICROSCOPIC
Bilirubin Urine: NEGATIVE
Glucose, UA: NEGATIVE mg/dL
Hgb urine dipstick: NEGATIVE
Ketones, ur: NEGATIVE mg/dL
Leukocytes, UA: NEGATIVE
Nitrite: NEGATIVE
Protein, ur: NEGATIVE mg/dL
SPECIFIC GRAVITY, URINE: 1.01 (ref 1.005–1.030)
Urobilinogen, UA: 0.2 mg/dL (ref 0.0–1.0)
pH: 8.5 — ABNORMAL HIGH (ref 5.0–8.0)

## 2014-08-05 LAB — PROTIME-INR
INR: 1.26 (ref 0.00–1.49)
PROTHROMBIN TIME: 16 s — AB (ref 11.6–15.2)

## 2014-08-05 LAB — PHOSPHORUS: Phosphorus: 1.4 mg/dL — ABNORMAL LOW (ref 2.3–4.6)

## 2014-08-05 LAB — HEPARIN LEVEL (UNFRACTIONATED): Heparin Unfractionated: 1.58 IU/mL — ABNORMAL HIGH (ref 0.30–0.70)

## 2014-08-05 LAB — STREP PNEUMONIAE URINARY ANTIGEN: STREP PNEUMO URINARY ANTIGEN: NEGATIVE

## 2014-08-05 LAB — LACTIC ACID, PLASMA: Lactic Acid, Venous: 2.1 mmol/L (ref 0.5–2.0)

## 2014-08-05 LAB — MRSA PCR SCREENING: MRSA BY PCR: POSITIVE — AB

## 2014-08-05 LAB — PROCALCITONIN: Procalcitonin: 0.18 ng/mL

## 2014-08-05 LAB — APTT: aPTT: 45 seconds — ABNORMAL HIGH (ref 24–37)

## 2014-08-05 LAB — MAGNESIUM: Magnesium: 2 mg/dL (ref 1.5–2.5)

## 2014-08-05 MED ORDER — CETYLPYRIDINIUM CHLORIDE 0.05 % MT LIQD
7.0000 mL | Freq: Four times a day (QID) | OROMUCOSAL | Status: DC
  Administered 2014-08-05 – 2014-08-09 (×16): 7 mL via OROMUCOSAL

## 2014-08-05 MED ORDER — FUROSEMIDE 10 MG/ML IJ SOLN
40.0000 mg | Freq: Four times a day (QID) | INTRAMUSCULAR | Status: AC
  Administered 2014-08-05 – 2014-08-06 (×3): 40 mg via INTRAVENOUS
  Filled 2014-08-05 (×3): qty 4

## 2014-08-05 MED ORDER — CHLORHEXIDINE GLUCONATE 0.12 % MT SOLN
15.0000 mL | Freq: Two times a day (BID) | OROMUCOSAL | Status: DC
  Administered 2014-08-05 – 2014-08-09 (×8): 15 mL via OROMUCOSAL
  Filled 2014-08-05 (×7): qty 15

## 2014-08-05 MED ORDER — DEXMEDETOMIDINE HCL IN NACL 400 MCG/100ML IV SOLN: 0.4000 ug/kg/h | INTRAVENOUS | Status: DC

## 2014-08-05 MED ORDER — VANCOMYCIN HCL 10 G IV SOLR
2000.0000 mg | Freq: Once | INTRAVENOUS | Status: AC
  Administered 2014-08-05: 2000 mg via INTRAVENOUS
  Filled 2014-08-05: qty 2000

## 2014-08-05 MED ORDER — SODIUM CHLORIDE 0.9 % IV SOLN
25.0000 ug/h | INTRAVENOUS | Status: DC
  Filled 2014-08-05: qty 50

## 2014-08-05 MED ORDER — CHLORHEXIDINE GLUCONATE CLOTH 2 % EX PADS
6.0000 | MEDICATED_PAD | Freq: Every day | CUTANEOUS | Status: DC
  Administered 2014-08-06 – 2014-08-09 (×4): 6 via TOPICAL

## 2014-08-05 MED ORDER — PANTOPRAZOLE SODIUM 40 MG IV SOLR
40.0000 mg | Freq: Every day | INTRAVENOUS | Status: DC
  Administered 2014-08-05: 40 mg via INTRAVENOUS
  Filled 2014-08-05 (×2): qty 40

## 2014-08-05 MED ORDER — HEPARIN (PORCINE) IN NACL 100-0.45 UNIT/ML-% IJ SOLN: 1200.0000 [IU]/h | INTRAMUSCULAR | Status: DC

## 2014-08-05 MED ORDER — ENOXAPARIN SODIUM 150 MG/ML ~~LOC~~ SOLN
125.0000 mg | Freq: Two times a day (BID) | SUBCUTANEOUS | Status: DC
  Filled 2014-08-05 (×2): qty 1

## 2014-08-05 MED ORDER — MUPIROCIN 2 % EX OINT
1.0000 "application " | TOPICAL_OINTMENT | Freq: Two times a day (BID) | CUTANEOUS | Status: AC
  Administered 2014-08-05 – 2014-08-09 (×9): 1 via NASAL
  Filled 2014-08-05: qty 22

## 2014-08-05 MED ORDER — INSULIN ASPART 100 UNIT/ML ~~LOC~~ SOLN
0.0000 [IU] | SUBCUTANEOUS | Status: DC
  Administered 2014-08-05 (×2): 2 [IU] via SUBCUTANEOUS
  Administered 2014-08-05: 1 [IU] via SUBCUTANEOUS
  Administered 2014-08-06 – 2014-08-07 (×6): 2 [IU] via SUBCUTANEOUS
  Administered 2014-08-07: 3 [IU] via SUBCUTANEOUS
  Administered 2014-08-07 (×3): 2 [IU] via SUBCUTANEOUS
  Administered 2014-08-08: 3 [IU] via SUBCUTANEOUS
  Administered 2014-08-08: 2 [IU] via SUBCUTANEOUS
  Administered 2014-08-08 (×3): 3 [IU] via SUBCUTANEOUS
  Administered 2014-08-09: 2 [IU] via SUBCUTANEOUS
  Administered 2014-08-09 (×4): 3 [IU] via SUBCUTANEOUS
  Administered 2014-08-10 (×2): 2 [IU] via SUBCUTANEOUS

## 2014-08-05 MED ORDER — SODIUM CHLORIDE 0.9 % IV SOLN: INTRAVENOUS | Status: DC

## 2014-08-05 MED ORDER — DEXMEDETOMIDINE HCL IN NACL 200 MCG/50ML IV SOLN: 0.4000 ug/kg/h | INTRAVENOUS | Status: DC

## 2014-08-05 MED ORDER — VANCOMYCIN HCL 10 G IV SOLR: 1500.0000 mg | INTRAVENOUS | Status: DC

## 2014-08-05 MED ORDER — HEPARIN SODIUM (PORCINE) 5000 UNIT/ML IJ SOLN: 5000.0000 [IU] | Freq: Three times a day (TID) | INTRAMUSCULAR | Status: DC

## 2014-08-05 MED ORDER — IPRATROPIUM-ALBUTEROL 0.5-2.5 (3) MG/3ML IN SOLN
3.0000 mL | Freq: Four times a day (QID) | RESPIRATORY_TRACT | Status: DC
  Administered 2014-08-05 – 2014-08-12 (×26): 3 mL via RESPIRATORY_TRACT
  Filled 2014-08-05 (×27): qty 3

## 2014-08-05 MED ORDER — SODIUM CHLORIDE 0.9 % IV SOLN
25.0000 ug/h | INTRAVENOUS | Status: DC
  Administered 2014-08-06: 275 ug/h via INTRAVENOUS
  Filled 2014-08-05 (×3): qty 50

## 2014-08-05 MED ORDER — LEVOTHYROXINE SODIUM 50 MCG PO TABS
50.0000 ug | ORAL_TABLET | Freq: Every day | ORAL | Status: DC
  Administered 2014-08-06: 50 ug via ORAL
  Filled 2014-08-05 (×2): qty 1

## 2014-08-05 MED ORDER — HEPARIN (PORCINE) IN NACL 100-0.45 UNIT/ML-% IJ SOLN
1000.0000 [IU]/h | INTRAMUSCULAR | Status: DC
  Administered 2014-08-06: 1200 [IU]/h via INTRAVENOUS
  Filled 2014-08-05 (×2): qty 250

## 2014-08-05 MED ORDER — PIPERACILLIN-TAZOBACTAM 3.375 G IVPB
3.3750 g | Freq: Three times a day (TID) | INTRAVENOUS | Status: DC
  Administered 2014-08-05 – 2014-08-08 (×10): 3.375 g via INTRAVENOUS
  Filled 2014-08-05 (×13): qty 50

## 2014-08-05 MED ORDER — VANCOMYCIN HCL 10 G IV SOLR: 1750.0000 mg | Freq: Two times a day (BID) | INTRAVENOUS | Status: DC

## 2014-08-05 NOTE — Progress Notes (Signed)
INITIAL NUTRITION ASSESSMENT  DOCUMENTATION CODES Per approved criteria  -Morbid Obesity   INTERVENTION:  If plans to continue supportive care, recommend initiate TF via OGT with Vital High Protein at 25 ml/h and Prostat 30 ml TID on day 1; on day 2, increase to goal rate of 50 ml/h (1200 ml per day) to provide 1500 kcals (25 kcals/kg ideal weight), 150 gm protein, 1003 ml free water daily.  NUTRITION DIAGNOSIS: Inadequate oral intake related to inability to eat as evidenced by NPO status.   Goal: Enteral nutrition to provide 60-70% of estimated calorie needs (22-25 kcals/kg ideal body weight) and 100% of estimated protein needs, based on ASPEN guidelines for hypocaloric, high protein feeding in critically ill obese individuals.  Monitor:  TF tolerance/adequacy, weight trend, labs, vent status.  Reason for Assessment: VDRF  74 y.o. female  Admitting Dx: FTT  ASSESSMENT: Patient presented [facility] on [facility] with increased weight gain, hypercarbia, and SOB. She required intubation on 3/2, extubated 3/10 and required reintubation. Transferred to [facility] on 3/10 for possible trach and PEG.  Discussed patient in ICU rounds and with RN today. Family has decided to hold off on tracheostomy for now. Plans to monitor for the next few days and see how she does. May be extubated without plans to reintubate. Nutrition focused physical exam completed.  No muscle or subcutaneous fat depletion noticed.  Patient is currently intubated on ventilator support MV: 10 L/min Temp (24hrs), Avg:98.9 F (37.2 C), Min:98.8 F (37.1 C), Max:99 F (37.2 C)  Propofol: none   Height: Ht Readings from Last 1 Encounters:  08/05/14 5\' 6"  (1.676 m)    Weight: Wt Readings from Last 1 Encounters:  08/05/14 281 lb 15.5 oz (127.9 kg)    Ideal Body Weight: 59.1 kg  % Ideal Body Weight: 216%  Wt Readings from Last 10 Encounters:  08/05/14 281 lb 15.5 oz (127.9 kg)  06/28/14 288 lb (130.636 kg)   11/02/13 295 lb 9.6 oz (134.083 kg)  08/10/13 298 lb (135.172 kg)    Usual Body Weight: 288 lbs  % Usual Body Weight: 98%  BMI:  Body mass index is 45.53 kg/(m^2). class 3, extreme/morbid obesity  Estimated Nutritional Needs: Kcal: 1992 Protein: 148 gm Fluid: 2 L  Skin: --  Diet Order: Diet NPO time specified  EDUCATION NEEDS: -Education not appropriate at this time   Intake/Output Summary (Last 24 hours) at 08/05/14 1326 Last data filed at 08/05/14 1200  Gross per 24 hour  Intake   82.5 ml  Output    100 ml  Net  -17.5 ml    Last BM: unknown   Labs:  No results for input(s): NA, K, CL, CO2, BUN, CREATININE, CALCIUM, MG, PHOS, GLUCOSE in the last 168 hours.  CBG (last 3)   Recent Labs  08/05/14 0957  GLUCAP 141*    Scheduled Meds: . antiseptic oral rinse  7 mL Mouth Rinse QID  . chlorhexidine  15 mL Mouth Rinse BID  . enoxaparin (LOVENOX) injection  125 mg Subcutaneous Q12H  . furosemide  40 mg Intravenous Q6H  . insulin aspart  0-15 Units Subcutaneous 6 times per day  . ipratropium-albuterol  3 mL Nebulization Q6H  . [START ON 08/06/2014] levothyroxine  50 mcg Oral QAC breakfast  . pantoprazole (PROTONIX) IV  40 mg Intravenous QHS  . piperacillin-tazobactam (ZOSYN)  IV  3.375 g Intravenous Q8H  . [START ON 08/06/2014] vancomycin  1,500 mg Intravenous Q24H  . vancomycin  2,000 mg  Intravenous Once    Continuous Infusions: . dexmedetomidine Stopped (08/05/14 1253)  . fentaNYL infusion INTRAVENOUS 275 mcg/hr (08/05/14 1252)    Past Medical History  Diagnosis Date  . Anemia   . Edema   . OSA (obstructive sleep apnea)   . Intention tremor   . Lymphedema   . Atrial fibrillation   . Chronic respiratory failure   . Diabetes   . DDD (degenerative disc disease)   . Respiratory failure   . PNA (pneumonia)   . Congestive heart failure   . Insomnia   . Adenomatous colon polyp   . MRSA (methicillin resistant staph aureus) culture positive   .  Hypertension   . Proteinuria   . Hyperlipidemia   . Restless legs   . Idiopathic peripheral neuropathy   . Urinary tract infection     Past Surgical History  Procedure Laterality Date  . Rotator cuff repair    . Cholecystectomy      Molli Barrows, RD, LDN, [place] Pager [phone] After Hours Pager [phone]

## 2014-08-05 NOTE — Progress Notes (Signed)
ANTICOAGULATION CONSULT NOTE - Follow Up Consult  Pharmacy Consult for heparin Indication: atrial fibrillation  Allergies  Allergen Reactions  . Codeine     nausea  . Darvon [Propoxyphene]     nausea    Patient Measurements: Height: 5\' 6"  (167.6 cm) Weight: 281 lb 15.5 oz (127.9 kg) IBW/kg (Calculated) : 59.3 Heparin Dosing Weight:   Vital Signs: Temp: 98.4 F (36.9 C) (03/11 1617) Temp Source: Oral (03/11 1617) BP: 110/44 mmHg (03/11 1600) Pulse Rate: 48 (03/11 1700)  Labs:  Recent Labs  08/05/14 1215 08/05/14 1650  HGB 9.8*  --   HCT 33.0*  --   PLT 205  --   APTT 45*  --   LABPROT 16.0*  --   INR 1.26  --   HEPARINUNFRC  --  1.58*  CREATININE 2.46*  --     Estimated Creatinine Clearance: 27 mL/min (by C-G formula based on Cr of 2.46).   Medications:  Scheduled:  . antiseptic oral rinse  7 mL Mouth Rinse QID  . chlorhexidine  15 mL Mouth Rinse BID  . [START ON 08/06/2014] Chlorhexidine Gluconate Cloth  6 each Topical Q0600  . furosemide  40 mg Intravenous Q6H  . insulin aspart  0-15 Units Subcutaneous 6 times per day  . ipratropium-albuterol  3 mL Nebulization Q6H  . [START ON 08/06/2014] levothyroxine  50 mcg Oral QAC breakfast  . mupirocin ointment  1 application Nasal BID  . pantoprazole (PROTONIX) IV  40 mg Intravenous QHS  . piperacillin-tazobactam (ZOSYN)  IV  3.375 g Intravenous Q8H  . [START ON 08/07/2014] vancomycin  1,750 mg Intravenous Q12H   Infusions:  . dexmedetomidine Stopped (08/05/14 1253)  . fentaNYL infusion INTRAVENOUS 275 mcg/hr (08/05/14 1252)  . [START ON 08/06/2014] heparin      Assessment: 74 yo female with afib who was previously on treatment dose of lovenox (last dose was 0530 on 08/05/14 at [place]) will be transitioned to heparin.  Heparin level now is 1.58 but this level may not totally correlate how anticoagulated the patient is since the patient received lovenox, not heparin.    Goal of Therapy:  Heparin level 0.3-0.7  units/ml Monitor platelets by anticoagulation protocol: Yes   Plan:  - Since her CrCl is <30, will delay the start of heparin @ 1200 units/hr until midnight tonight (which is ~75% of dosing interval if she was to be on lovenox) - 8hr heparin level after that.  Maresha Anastos, Tsz-Yin 08/05/2014,7:13 PM

## 2014-08-05 NOTE — Progress Notes (Signed)
RT note-Vent settings are from [facility] and repeat ABG done 500/14/+5/50%. MD notified.

## 2014-08-05 NOTE — H&P (Signed)
PULMONARY / CRITICAL CARE MEDICINE   Name: Meagan Campbell MRN: [id number] DOB: 09/27/1940    ADMISSION DATE:  08/05/2014   REFERRING MD :  [place]  CHIEF COMPLAINT:  FTT  INITIAL PRESENTATION: SOB,CKD,FTT  STUDIES:    SIGNIFICANT EVENTS: 3/10 reintubated 3/11 transferred to [facility] for Trach and Peg   HISTORY OF PRESENT ILLNESS:   [name] with known severe OHS(bipap dependent), COPD on O2 for years, CKD stage 4 (followed by Dr. Clover Mealy), CAF(on amio), CHF (on diuretics) who required intubation several years ago and was on ventilator for a prolonged period. She is followed by Dr. Joya Gaskins for her OHS/OSA. She presented on 2/23 to [facility] with increased weight gain,hypercarbia and SOB. Admitted and treated with agressive diuresis and abx for UTI and Pna. She developed increased SOB 3/2 and had resp arrest and was intubated. She was extubated but on 3/10 required reintubation and was transferred to [facility] ostensibly for Trach and Peg. Pccm will admit make arrangements for trach/peg.  PAST MEDICAL HISTORY :   has a past medical history of Anemia; Edema; OSA (obstructive sleep apnea); Intention tremor; Lymphedema; Atrial fibrillation; Chronic respiratory failure; Diabetes; DDD (degenerative disc disease); Respiratory failure; PNA (pneumonia); Congestive heart failure; Insomnia; Adenomatous colon polyp; MRSA (methicillin resistant staph aureus) culture positive; Hypertension; Proteinuria; Hyperlipidemia; Restless legs; Idiopathic peripheral neuropathy; and Urinary tract infection.  has past surgical history that includes Rotator cuff repair and Cholecystectomy. Prior to Admission medications   Medication Sig Start Date End Date Taking? Authorizing Provider  ACCU-CHEK AVIVA PLUS test strip Use as directed    Historical Provider, MD  amiodarone (PACERONE) 200 MG tablet Take 200 mg by mouth daily.    Historical Provider, MD  aspirin 325 MG tablet Take 325 mg by mouth  daily.    Historical Provider, MD  Blood Glucose Calibration (ACCU-CHEK AVIVA) SOLN Use as directed    Historical Provider, MD  Blood Glucose Monitoring Suppl (ACCU-CHEK AVIVA PLUS) W/DEVICE KIT Use as directed    Historical Provider, MD  carbidopa-levodopa (SINEMET IR) 25-250 MG per tablet Take 1 tablet by mouth 3 (three) times daily.    Historical Provider, MD  carvedilol (COREG) 6.25 MG tablet Take 6.25 mg by mouth 2 (two) times daily. 10/14/13   Historical Provider, MD  ferrous sulfate 325 (65 FE) MG tablet Take 325 mg by mouth daily with breakfast.    Historical Provider, MD  fosinopril (MONOPRIL) 40 MG tablet Take 40 mg by mouth daily.    Historical Provider, MD  furosemide (LASIX) 80 MG tablet Take 120 mg by mouth 2 (two) times daily.    Historical Provider, MD  gabapentin (NEURONTIN) 300 MG capsule Take 300 mg by mouth daily.  10/26/13   Historical Provider, MD  hydrALAZINE (APRESOLINE) 50 MG tablet Take 50 mg by mouth 3 (three) times daily.    Historical Provider, MD  HYDROcodone-acetaminophen (NORCO/VICODIN) 5-325 MG per tablet Take 1 tablet by mouth every 6 (six) hours as needed for moderate pain.    Historical Provider, MD  insulin aspart protamine- aspart (NOVOLOG MIX 70/30) (70-30) 100 UNIT/ML injection Inject 60 Units into the skin 2 (two) times daily with a meal.     Historical Provider, MD  ipratropium-albuterol (DUONEB) 0.5-2.5 (3) MG/3ML SOLN Take 3 mLs by nebulization 2 (two) times daily.     Historical Provider, MD  isosorbide mononitrate (IMDUR) 30 MG 24 hr tablet Take 30 mg by mouth daily.    Historical Provider, MD  magnesium  hydroxide (MILK OF MAGNESIA) 400 MG/5ML suspension Take 30 mLs by mouth daily as needed for mild constipation.    Historical Provider, MD  magnesium oxide (MAG-OX) 400 MG tablet Take 400 mg by mouth 2 (two) times daily.    Historical Provider, MD  Melatonin 5 MG TABS Take 1 tablet by mouth at bedtime.     Historical Provider, MD  nystatin  (MYCOSTATIN/NYSTOP) 100000 UNIT/GM POWD as needed. 05/05/14   Historical Provider, MD  Omega-3 Fatty Acids (FISH OIL CONCENTRATE PO) Take 2 capsules by mouth 2 (two) times daily.     Historical Provider, MD  ondansetron (ZOFRAN) 4 MG tablet Take 1 tablet by mouth every 4 (four) hours as needed.    Historical Provider, MD  rOPINIRole (REQUIP) 4 MG tablet Take 4 mg by mouth at bedtime. 2 tabs at bed time    Historical Provider, MD  simvastatin (ZOCOR) 40 MG tablet Take 40 mg by mouth daily.    Historical Provider, MD  ULTILET CLASSIC LANCETS MISC by Does not apply route.    Historical Provider, MD   Allergies  Allergen Reactions  . Codeine     nausea  . Darvon [Propoxyphene]     nausea    FAMILY HISTORY:  has no family status information on file.  SOCIAL HISTORY:  reports that she has never smoked. She has never used smokeless tobacco. She reports that she does not drink alcohol or use illicit drugs.  REVIEW OF SYSTEMS:  Na  SUBJECTIVE:   VITAL SIGNS: Temp:  [99 F (37.2 C)] 99 F (37.2 C) (03/11 1000) Resp:  [20] 20 (03/11 0944) SpO2:  [100 %] 100 % (03/11 0944) FiO2 (%):  [50 %] 50 % (03/11 1000) Weight:  [281 lb 15.5 oz (127.9 kg)] 281 lb 15.5 oz (127.9 kg) (03/11 1000) HEMODYNAMICS:   VENTILATOR SETTINGS: Vent Mode:  [-] PRVC FiO2 (%):  [50 %] 50 % Set Rate:  [20 bmp] 20 bmp Vt Set:  [470 mL] 470 mL PEEP:  [5 cmH20] 5 cmH20 Plateau Pressure:  [30 cmH20] 30 cmH20 INTAKE / OUTPUT: No intake or output data in the 24 hours ending 08/05/14 1015  PHYSICAL EXAMINATION: General:  MO WF follows commands Neuro:  Follows commands HEENT: ET->VENT, ogt-> Cardiovascular:  hsir Lungs:  Decreased in bases Abdomen:  Obese + bs Musculoskeletal: intact Skin:  ++lower ext edema  LABS:  CBC No results for input(s): WBC, HGB, HCT, PLT in the last 168 hours. Coag's No results for input(s): APTT, INR in the last 168 hours. BMET No results for input(s): NA, K, CL, CO2, BUN,  CREATININE, GLUCOSE in the last 168 hours. Electrolytes No results for input(s): CALCIUM, MG, PHOS in the last 168 hours. Sepsis Markers No results for input(s): LATICACIDVEN, PROCALCITON, O2SATVEN in the last 168 hours. ABG No results for input(s): PHART, PCO2ART, PO2ART in the last 168 hours. Liver Enzymes No results for input(s): AST, ALT, ALKPHOS, BILITOT, ALBUMIN in the last 168 hours. Cardiac Enzymes No results for input(s): TROPONINI, PROBNP in the last 168 hours. Glucose  Recent Labs Lab 08/05/14 0957  GLUCAP 141*    Imaging No results found.   ASSESSMENT / PLAN:  PULMONARY OETT 3/10>> A: Acute on chronic hypercarbic resp failure OHS History of vent dependency in past P:   Vent bundle Pursue early trach vs EOL discussion.   CARDIOVASCULAR CVL  A:  CAD post MI CAF on amio Htn P:  Hold  amio , coreg with bradycardia, check 12 lead  BB as needed  RENAL A:  CKD P:   Follow creatine  GASTROINTESTINAL A:   GI protection Will need PEG P:   PPI  HEMATOLOGIC A:   Chronic anemia P:  Follow cbc  INFECTIOUS A:   Uti klebsielleae  P:   BCx2 3/11>> UC 3/11>> Sputum 3/11>> Abx:  3/10 vanco>> 3/10 zoysn>>  ENDOCRINE A:  DM Hypothyroidism  P:   SSI Synthroid  NEUROLOGIC A:   AMS from hypercarbia Parkinson disease(presumed, not listd in PMH but is on Sinemet) P:   RASS goal: -1 Stop Sinemet, no documented hx of Parkinson dz.   FAMILY  - Updates: None at bedsidde  - Inter-disciplinary family meet or Palliative Care meeting due by:  day 7  TODAY'S SUMMARY: [name] with known severe OHS(bipap dependent), COPD on O2 for years, CKD stage 4 (followed by Dr. Clover Mealy), CAF(on amio), CHF (on diuretics) who required intubation several years ago and was on ventilator for a prolonged period. She is followed by Dr. Joya Gaskins for her OHS/OSA. She presented on 2/23 to [facility] with increased weight gain,hypercarbia and SOB.  Admitted and treated with agressive diuresis and abx for UTI and Pna. She developed increased SOB 3/2 and had resp arrest and was intubated. She was extubated but on 3/10 required reintubation and was transferred to [facility] ostensibly for Trach and Peg. Pccm will admit make arrangements for trach/peg.  Richardson Landry Minor ACNP Maryanna Shape PCCM Pager [phone] till 3 pm If no answer page [phone]  74 year old female with advanced COPD and advanced respiratory failure who has failed extubation x3 in [place] transferred to [facility] for trach/peg placement.  Decreased respiratory sounds in all lung fields.  Attempted weaning at 10/5 and patient was breathing 40 times a minute with Tv of 150 cc.  She is clearly not ready for extubation and doubt she ever will.  Her only chances at survival here will be a tracheostomy.  I spoke with the husband and three children extensively.  After describing the procedure and what it entails with regards to quality of life they declined proceeding with the procedure.  They requested a couple of days to think about it.  I was clear with them that while I can not tell them with certainty that she will not survive extubation or even become ready for extubation her advanced disease now preclude extubation and makes her an unlikely candidate to decannulation post tracheostomy.  The goal will be to extubate with no intention to reintubate and if fails then full comfort.  Will hold IVF, diurese as renal function will allow and will attempt extubation later.  I spoke with the family and after discussion decision is made to make patient LCB with no CPR/cardioversion but continue support for now.  Will continue abx and monitor in the ICU.  The patient is critically ill with multiple organ systems failure and requires high complexity decision making for assessment and support, frequent evaluation and titration of therapies, application of advanced monitoring technologies and extensive interpretation of  multiple databases.   Critical Care Time devoted to patient care services described in this note is  45  Minutes. This time reflects time of care of this signee Dr Jennet Maduro. This critical care time does not reflect procedure time, or teaching time or supervisory time of PA/NP/Med student/Med Resident etc but could involve care discussion time.  Rush Farmer, M.D. [facility] Pulmonary/Critical Care Medicine. Pager: [phone]. After hours pager: [phone].  08/05/2014,  10:26 AM

## 2014-08-05 NOTE — Progress Notes (Signed)
CRITICAL VALUE ALERT  Critical value received:  + MRSA nasal swab  Date of notification:  08/05/14  Time of notification:  6484  Critical value read back:Yes.    Nurse who received alert:  Curt Bears RN  MD notified (1st page):     Time of first page:     MD notified (2nd page):  Time of second page:  Responding MD:     Time MD responded:     Positive results from [facility]- MD aware.

## 2014-08-05 NOTE — Progress Notes (Addendum)
ANTIBIOTIC & ANTICOAGULTION CONSULT NOTE - INITIAL  Pharmacy Consult:  Vancomycin / Zosyn + Lovenox Indication:  HCAP + Afib  Allergies  Allergen Reactions  . Codeine     nausea  . Darvon [Propoxyphene]     nausea    Patient Measurements: Height: 5\' 6"  (167.6 cm) Weight: 281 lb 15.5 oz (127.9 kg) IBW/kg (Calculated) : 59.3  Vital Signs: Temp: 99 F (37.2 C) (03/11 1000) Temp Source: Oral (03/11 1000)  Labs: No results for input(s): WBC, HGB, PLT, LABCREA, CREATININE in the last 72 hours. CrCl cannot be calculated (Patient has no serum creatinine result on file.). No results for input(s): VANCOTROUGH, VANCOPEAK, VANCORANDOM, GENTTROUGH, GENTPEAK, GENTRANDOM, TOBRATROUGH, TOBRAPEAK, TOBRARND, AMIKACINPEAK, AMIKACINTROU, AMIKACIN in the last 72 hours.   Microbiology: No results found for this or any previous visit (from the past 720 hour(s)).  Medical History: Past Medical History  Diagnosis Date  . Anemia   . Edema   . OSA (obstructive sleep apnea)   . Intention tremor   . Lymphedema   . Atrial fibrillation   . Chronic respiratory failure   . Diabetes   . DDD (degenerative disc disease)   . Respiratory failure   . PNA (pneumonia)   . Congestive heart failure   . Insomnia   . Adenomatous colon polyp   . MRSA (methicillin resistant staph aureus) culture positive   . Hypertension   . Proteinuria   . Hyperlipidemia   . Restless legs   . Idiopathic peripheral neuropathy   . Urinary tract infection      Assessment: 40 YOF presented to [facility] on 07/19/14 and was treated for UTI and PNA.  She had respiratory arrest on 07/27/14 and required intubation.  Patient transferred to [facility] on 08/05/14 for possible trach and PEG.  Pharmacy consulted to continue Zosyn and add vancomycin for HCAP.  Patient's SCr fluctuates between 1.5 and 2 per discharge summary.  Her last dose of Zosyn was around 0430 per Pharmacist at [place].  She was started on full-dose Lovenox at [facility]  on 08/02/14, likely for Afib since her discharge summary dictated VQ scan showed low probability of PE (CHADsVASc = 6).  Her last dose was around 0530 today per Pharmacist at [place].  Noted patient has reduced clearance.  Vanc 3/10 >> Zosyn 3/10 >> LVQ 2/23 >> 3/4, 3/10 >> 3/10  3/11 BCx x2 - 3/11 UCx - 3/11 TA -   Goal of Therapy:  Vancomycin trough level 15-20 mcg/ml  Anti-Xa level 0.6-1 units/ml 4hrs after LMWH dose given   Plan:  - Vanc 2gm IV x 1, then 1500mg  IV Q24H - Zosyn 3.375gm IV Q8H, 4 hr infusion - Monitor renal fxn, clinical progress, vanc trough at Css given obesity - Lovenox 125mg  SQ Q12H, start tonight - CBC Q72H while on Lovenox - Monitor for s/sx of bleeding closely given renal dysfunction    Greg Eckrich D. Mina Marble, PharmD, BCPS Pager:  [phone] 08/05/2014, 11:45 AM    ==================================   Addendum: - SCr up to 2.46, estimated CrCL 22 ml/min - switch from Lovenox to IV heparin - heparin dosing weight = 90 kg   Goal of Therapy: HL 0.3-0.7 units/mL   Plan: - D/C Lovenox (last dose around 0530 prior to transfer) - Tonight, start IV heparin at 1200 units/hr - Check 8 hr HL - Daily HL / CBC - Change vanc to 1750mg  IV Q48H, start 3/13 - Monitor renal fxn    Briant Angelillo D. Mina Marble, PharmD, [place] Pager:  [phone] 08/05/2014, 2:31 PM

## 2014-08-05 NOTE — Progress Notes (Signed)
CRITICAL VALUE ALERT  Critical value received:  Lactic acid 2.1  Date of notification:  08/05/14  Time of notification:  1300  Critical value read back:Yes.    Nurse who received alert:  Curt Bears RN  MD notified (1st page):  Dr. Halford Chessman  Time of first page:  1305  MD notified (2nd page):  Time of second page:  Responding MD:     Time MD responded:

## 2014-08-06 ENCOUNTER — Inpatient Hospital Stay (HOSPITAL_COMMUNITY): Payer: Medicare Other

## 2014-08-06 DIAGNOSIS — E662 Morbid (severe) obesity with alveolar hypoventilation: Secondary | ICD-10-CM

## 2014-08-06 DIAGNOSIS — R601 Generalized edema: Secondary | ICD-10-CM

## 2014-08-06 DIAGNOSIS — E1129 Type 2 diabetes mellitus with other diabetic kidney complication: Secondary | ICD-10-CM

## 2014-08-06 LAB — GLUCOSE, CAPILLARY
GLUCOSE-CAPILLARY: 116 mg/dL — AB (ref 70–99)
GLUCOSE-CAPILLARY: 124 mg/dL — AB (ref 70–99)
GLUCOSE-CAPILLARY: 127 mg/dL — AB (ref 70–99)
GLUCOSE-CAPILLARY: 131 mg/dL — AB (ref 70–99)
Glucose-Capillary: 128 mg/dL — ABNORMAL HIGH (ref 70–99)
Glucose-Capillary: 112 mg/dL — ABNORMAL HIGH (ref 70–99)

## 2014-08-06 LAB — BASIC METABOLIC PANEL
Anion gap: 13 (ref 5–15)
BUN: 28 mg/dL — ABNORMAL HIGH (ref 6–23)
CHLORIDE: 93 mmol/L — AB (ref 96–112)
CO2: 35 mmol/L — ABNORMAL HIGH (ref 19–32)
Calcium: 8.8 mg/dL (ref 8.4–10.5)
Creatinine, Ser: 2.62 mg/dL — ABNORMAL HIGH (ref 0.50–1.10)
GFR, EST AFRICAN AMERICAN: 19 mL/min — AB (ref >90)
GFR, EST NON AFRICAN AMERICAN: 17 mL/min — AB (ref >90)
GLUCOSE: 127 mg/dL — AB (ref 70–99)
POTASSIUM: 3.3 mmol/L — AB (ref 3.5–5.1)
Sodium: 141 mmol/L (ref 135–145)

## 2014-08-06 LAB — CBC
HEMATOCRIT: 34.2 % — AB (ref 36.0–46.0)
Hemoglobin: 10.5 g/dL — ABNORMAL LOW (ref 12.0–15.0)
MCH: 26.9 pg (ref 26.0–34.0)
MCHC: 30.7 g/dL (ref 30.0–36.0)
MCV: 87.5 fL (ref 78.0–100.0)
Platelets: 157 10*3/uL (ref 150–400)
RBC: 3.91 MIL/uL (ref 3.87–5.11)
RDW: 19.3 % — ABNORMAL HIGH (ref 11.5–15.5)
WBC: 6.1 10*3/uL (ref 4.0–10.5)

## 2014-08-06 LAB — POCT I-STAT 3, ART BLOOD GAS (G3+)
Acid-Base Excess: 13 mmol/L — ABNORMAL HIGH (ref 0.0–2.0)
BICARBONATE: 38.2 meq/L — AB (ref 20.0–24.0)
O2 SAT: 90 %
PH ART: 7.502 — AB (ref 7.350–7.450)
PO2 ART: 54 mmHg — AB (ref 80.0–100.0)
Patient temperature: 98.9
TCO2: 40 mmol/L (ref 0–100)
pCO2 arterial: 48.9 mmHg — ABNORMAL HIGH (ref 35.0–45.0)

## 2014-08-06 LAB — CORTISOL: Cortisol, Plasma: 11.3 ug/dL

## 2014-08-06 LAB — TSH: TSH: 6.21 u[IU]/mL — AB (ref 0.350–4.500)

## 2014-08-06 LAB — PHOSPHORUS: Phosphorus: 2.6 mg/dL (ref 2.3–4.6)

## 2014-08-06 LAB — URINE CULTURE
Colony Count: NO GROWTH
Culture: NO GROWTH
SPECIAL REQUESTS: NORMAL

## 2014-08-06 LAB — HEMOGLOBIN A1C
Hgb A1c MFr Bld: 6.3 % — ABNORMAL HIGH (ref 4.8–5.6)
MEAN PLASMA GLUCOSE: 134 mg/dL

## 2014-08-06 LAB — HEPARIN LEVEL (UNFRACTIONATED): HEPARIN UNFRACTIONATED: 1.08 [IU]/mL — AB (ref 0.30–0.70)

## 2014-08-06 LAB — MAGNESIUM: Magnesium: 2 mg/dL (ref 1.5–2.5)

## 2014-08-06 MED ORDER — VITAL HIGH PROTEIN PO LIQD
1000.0000 mL | ORAL | Status: DC
  Administered 2014-08-06 – 2014-08-08 (×3): 1000 mL
  Administered 2014-08-08: 08:00:00
  Filled 2014-08-06 (×4): qty 1000

## 2014-08-06 MED ORDER — FENTANYL CITRATE 0.05 MG/ML IJ SOLN
12.5000 ug | INTRAMUSCULAR | Status: DC | PRN
  Administered 2014-08-07 – 2014-08-09 (×4): 50 ug via INTRAVENOUS
  Filled 2014-08-06 (×4): qty 2

## 2014-08-06 MED ORDER — LEVOTHYROXINE SODIUM 50 MCG PO TABS
50.0000 ug | ORAL_TABLET | Freq: Every day | ORAL | Status: DC
  Filled 2014-08-06: qty 1

## 2014-08-06 MED ORDER — AMIODARONE HCL 200 MG PO TABS
200.0000 mg | ORAL_TABLET | Freq: Every day | ORAL | Status: DC
  Administered 2014-08-06 – 2014-08-08 (×3): 200 mg
  Filled 2014-08-06 (×4): qty 1

## 2014-08-06 MED ORDER — VITAL HIGH PROTEIN PO LIQD
1000.0000 mL | ORAL | Status: DC
  Filled 2014-08-06 (×2): qty 1000

## 2014-08-06 MED ORDER — PRO-STAT SUGAR FREE PO LIQD
60.0000 mL | Freq: Four times a day (QID) | ORAL | Status: DC
  Administered 2014-08-06 – 2014-08-08 (×10): 60 mL
  Filled 2014-08-06 (×15): qty 60

## 2014-08-06 MED ORDER — PANTOPRAZOLE SODIUM 40 MG PO PACK
40.0000 mg | PACK | Freq: Every day | ORAL | Status: DC
  Administered 2014-08-06 – 2014-08-08 (×3): 40 mg
  Filled 2014-08-06 (×4): qty 20

## 2014-08-06 MED ORDER — HEPARIN SODIUM (PORCINE) 5000 UNIT/ML IJ SOLN
5000.0000 [IU] | Freq: Three times a day (TID) | INTRAMUSCULAR | Status: DC
  Administered 2014-08-06 – 2014-08-10 (×12): 5000 [IU] via SUBCUTANEOUS
  Filled 2014-08-06 (×15): qty 1

## 2014-08-06 MED ORDER — LEVOTHYROXINE SODIUM 75 MCG PO TABS
75.0000 ug | ORAL_TABLET | Freq: Every day | ORAL | Status: DC
  Administered 2014-08-07 – 2014-08-08 (×2): 75 ug
  Filled 2014-08-06 (×5): qty 1

## 2014-08-06 MED ORDER — PRO-STAT SUGAR FREE PO LIQD
30.0000 mL | Freq: Two times a day (BID) | ORAL | Status: DC
  Administered 2014-08-06: 30 mL
  Filled 2014-08-06 (×2): qty 30

## 2014-08-06 MED ORDER — POTASSIUM CHLORIDE 10 MEQ/100ML IV SOLN
10.0000 meq | INTRAVENOUS | Status: AC
  Administered 2014-08-06 (×6): 10 meq via INTRAVENOUS
  Filled 2014-08-06 (×6): qty 100

## 2014-08-06 MED ORDER — FENTANYL CITRATE 0.05 MG/ML IJ SOLN: 12.5000 ug | INTRAMUSCULAR | Status: DC | PRN

## 2014-08-06 MED ORDER — VANCOMYCIN HCL 10 G IV SOLR
1750.0000 mg | INTRAVENOUS | Status: DC
  Administered 2014-08-07: 1750 mg via INTRAVENOUS
  Filled 2014-08-06: qty 1750

## 2014-08-06 NOTE — Progress Notes (Signed)
PULMONARY / CRITICAL CARE MEDICINE   Name: Meagan Campbell MRN: [id number] DOB: 09/06/1940    ADMISSION DATE:  08/05/2014   REFERRING MD :  [place]  CHIEF COMPLAINT:  FTT  INITIAL PRESENTATION:  66 F with OHS, O2 dependence, CKD, chronic severe edema admitted to [facility] 2/23 with worsening edema. Treated with diuresis and empiric abx. Intubated 3/02. Failed extubation 3/10. Transferred to [facility] 3/11  MAJOR EVENTS/TEST RESULTS:   INDWELLING DEVICES:: ETT 3/02 >> 3/08, 3/10 >>   MICRO DATA: Blood 3/11 >>   ANTIMICROBIALS:  Vanc 3/11 >>  Pip-tazo 3/12 >>    SUBJECTIVE:  RASS -3, +/- F/C. Apneic on SBT  VITAL SIGNS: Temp:  [98.1 F (36.7 C)-98.6 F (37 C)] 98.3 F (36.8 C) (03/12 1224) Pulse Rate:  [48-77] 54 (03/12 1300) Resp:  [13-21] 14 (03/12 1300) BP: (52-124)/(20-78) 115/33 mmHg (03/12 1300) SpO2:  [89 %-100 %] 93 % (03/12 1300) FiO2 (%):  [40 %] 40 % (03/12 1226) Weight:  [128 kg (282 lb 3 oz)] 128 kg (282 lb 3 oz) (03/12 0500) HEMODYNAMICS:   VENTILATOR SETTINGS: Vent Mode:  [-] PRVC FiO2 (%):  [40 %] 40 % Set Rate:  [14 bmp] 14 bmp Vt Set:  [500 mL] 500 mL PEEP:  [5 cmH20] 5 cmH20 Plateau Pressure:  [17 cmH20-30 cmH20] 17 cmH20 INTAKE / OUTPUT:  Intake/Output Summary (Last 24 hours) at 08/06/14 1536 Last data filed at 08/06/14 1140  Gross per 24 hour  Intake   1393 ml  Output    825 ml  Net    568 ml    PHYSICAL EXAMINATION: General:  NAD, obese Neuro:  MAEs HEENT: WNL Cardiovascular: Reg, no M Lungs:  Clear anteriorly Abdomen:  Obese, soft, + bs Ext: 3+ symmetric edema  LABS: I have reviewed all of today's lab results. Relevant abnormalities are discussed in the A/P section  CXR: low volumes, edema pattern  ASSESSMENT / PLAN:  PULMONARY OETT 3/10 >> A: Acute on chronic hypercarbic resp failure Severe OSA/OHS P:   Cont full vent support - settings reviewed and/or adjusted Cont vent bundle Daily SBT if/when meets  criteria  CARDIOVASCULAR A:  CAD  PAF, NSR presently Htn P:  Holding carvedilol Resume enteral amiodarone DC heparin gtt (poor candidate for long term anticoagulation)  RENAL A:   CKD Hypokalemia Anasarca P:   Monitor BMET intermittently Monitor I/Os Correct electrolytes as indicated  GASTROINTESTINAL A:   Obesity P:   SUP: enteral PPI Initiate TFs 3/12  HEMATOLOGIC A:   Chronic anemia P:  DVT px: SQ heparin Monitor CBC intermittently Transfuse per usual ICU guidelines  INFECTIOUS A:   UTI, treated No apparent active infection P:   Micro and abx as above  ENDOCRINE A:  DM 2 Hypothyroidism, TSH elevated P:   Cont SSI Increase L thyroxine  NEUROLOGIC A:   AMS, hypercarbic P:   RASS goal: 0 Minimize sedative/ananlgesics   FAMILY  Daughters updated 3/12  35 mins CCM time    Merton Border, MD ; [facility] service Mobile [phone].  After 5:30 PM or weekends, call [phone]  08/06/2014, 3:36 PM

## 2014-08-06 NOTE — Progress Notes (Signed)
ANTICOAGULATION CONSULT NOTE - Follow Up Consult  Pharmacy Consult for heparin Indication: atrial fibrillation  Allergies  Allergen Reactions  . Codeine     nausea  . Darvon [Propoxyphene]     nausea    Patient Measurements: Height: 5\' 6"  (167.6 cm) Weight: 282 lb 3 oz (128 kg) IBW/kg (Calculated) : 59.3  Vital Signs: Temp: 98.6 F (37 C) (03/12 0814) Temp Source: Oral (03/12 0814) BP: 95/32 mmHg (03/12 0900) Pulse Rate: 52 (03/12 0900)  Labs:  Recent Labs  08/05/14 1215 08/05/14 1650 08/06/14 0235 08/06/14 0805  HGB 9.8*  --  10.5*  --   HCT 33.0*  --  34.2*  --   PLT 205  --  157  --   APTT 45*  --   --   --   LABPROT 16.0*  --   --   --   INR 1.26  --   --   --   HEPARINUNFRC  --  1.58*  --  1.08*  CREATININE 2.46*  --  2.62*  --     Estimated Creatinine Clearance: 25.4 mL/min (by C-G formula based on Cr of 2.62).   Medications:  Scheduled:  . antiseptic oral rinse  7 mL Mouth Rinse QID  . chlorhexidine  15 mL Mouth Rinse BID  . Chlorhexidine Gluconate Cloth  6 each Topical Q0600  . insulin aspart  0-15 Units Subcutaneous 6 times per day  . ipratropium-albuterol  3 mL Nebulization Q6H  . levothyroxine  50 mcg Oral QAC breakfast  . mupirocin ointment  1 application Nasal BID  . pantoprazole (PROTONIX) IV  40 mg Intravenous QHS  . piperacillin-tazobactam (ZOSYN)  IV  3.375 g Intravenous Q8H  . potassium chloride  10 mEq Intravenous Q1 Hr x 6  . [START ON 08/07/2014] vancomycin  1,750 mg Intravenous Q12H   Infusions:  . dexmedetomidine Stopped (08/05/14 1253)  . fentaNYL infusion INTRAVENOUS 275 mcg/hr (08/06/14 0146)  . heparin 1,200 Units/hr (08/06/14 0000)    Assessment: 74yo female with afib who was previously on treatment dose of lovenox (last dose was 0530 on 08/05/14 at [place]) >> now on heparin gtt.  Heparin level remains elevated, but may accurately reflect anticoagulation d/t heparin since pt was previously on lovenox and her renal  function has declined.   Goal of Therapy:  Heparin level 0.3-0.7 units/ml Monitor platelets by anticoagulation protocol: Yes   Plan:  - Decrease rate heparin gtt 1000 units/hr - Check 8hr heparin level - Monitor daily heparin level, CBC, s/sx of bleeding  Drucie Opitz, PharmD Clinical Pharmacy Resident Pager: [phone] 08/06/2014 9:09 AM

## 2014-08-06 NOTE — Progress Notes (Signed)
[facility] ADULT ICU REPLACEMENT PROTOCOL FOR AM LAB REPLACEMENT ONLY  The patient does not apply for the [facility] Adult ICU Electrolyte Replacment Protocol based on the criteria listed below:   1. Is GFR >/= 40 ml/min? No.  Patient's GFR today is 17 2. Is urine output >/= 0.5 ml/kg/hr for the last 6 hours? No. Patient's UOP is 0.2 ml/kg/hr 3. Is BUN < 60 mg/dL? Yes.    Patient's BUN today is 28 4. Abnormal electrolyte(s): K+3.3 5. Ordered repletion with: protocol 6. If a panic level lab has been reported, has the CCM MD in charge been notified? Yes.  .   Physician:  E Deterding  Conce[facility] 08/06/2014 5:32 AM

## 2014-08-06 NOTE — Progress Notes (Signed)
NUTRITION FOLLOW UP  DOCUMENTATION CODES Per approved criteria  -Morbid Obesity   INTERVENTION: Initiate TF via OGT with Vital High Protein at 15 ml/h and Prostat 30 ml TID on day 1; on day 2, increase PS to 60 ml QID (360 ml of TF per day)  TF to provide 1160 kcals (66% needs), 152 gm protein, 300 ml free water daily.  NUTRITION DIAGNOSIS: Inadequate oral intake related to inability to eat as evidenced by NPO status.   Goal: Enteral nutrition to provide 60-70% of estimated calorie needs (22-25 kcals/kg ideal body weight) and 100% of estimated protein needs, based on ASPEN guidelines for hypocaloric, high protein feeding in critically ill obese individuals.  Monitor:  TF tolerance/adequacy, weight trend, labs, vent status.  Reason for Assessment: TF Iniation   74 y.o. female  Admitting Dx: FTT  ASSESSMENT: Patient presented to [facility] on 2/23 with increased weight gain, hypercarbia, and SOB. She required intubation on 3/2, extubated 3/10 and required reintubation. Transferred to [facility] on 3/10 for possible trach and PEG.    Patient is currently intubated on ventilator support MV: 6.6 L/min Temp (24hrs), Avg:98.3 F (36.8 C), Min:98.1 F (36.7 C), Max:98.6 F (37 C)  Propofol: none   Height: Ht Readings from Last 1 Encounters:  08/05/14 5\' 6"  (1.676 m)    Weight: Wt Readings from Last 1 Encounters:  08/06/14 282 lb 3 oz (128 kg)    Ideal Body Weight: 59.1 kg  % Ideal Body Weight: 216%  BMI:  Body mass index is 45.57 kg/(m^2). class 3, extreme/morbid obesity  Estimated Nutritional Needs: Kcal: 1757 (1054-1230 kcals= 60-70% ) Protein: 148 gm Fluid: 2 L  Skin: MSAD buttocks  Diet Order:    EDUCATION NEEDS: -Education not appropriate at this time   Intake/Output Summary (Last 24 hours) at 08/06/14 1226 Last data filed at 08/06/14 1140  Gross per 24 hour  Intake 2001.67 ml  Output    825 ml  Net 1176.67 ml    Last BM: unknown    Labs:   Recent Labs Lab 08/05/14 1215 08/06/14 0235  NA 140 141  K 3.6 3.3*  CL 90* 93*  CO2 37* 35*  BUN 28* 28*  CREATININE 2.46* 2.62*  CALCIUM 8.9 8.8  MG 2.0 2.0  PHOS 1.4* 2.6  GLUCOSE 139* 127*    CBG (last 3)   Recent Labs  08/06/14 0044 08/06/14 0354 08/06/14 0807  GLUCAP 131* 124* 128*    Scheduled Meds: . amiodarone  200 mg Per Tube Daily  . antiseptic oral rinse  7 mL Mouth Rinse QID  . chlorhexidine  15 mL Mouth Rinse BID  . Chlorhexidine Gluconate Cloth  6 each Topical Q0600  . feeding supplement (PRO-STAT SUGAR FREE 64)  30 mL Per Tube BID  . feeding supplement (VITAL HIGH PROTEIN)  1,000 mL Per Tube Q24H  . heparin subcutaneous  5,000 Units Subcutaneous 3 times per day  . insulin aspart  0-15 Units Subcutaneous 6 times per day  . ipratropium-albuterol  3 mL Nebulization Q6H  . [START ON 08/07/2014] levothyroxine  50 mcg Per Tube QAC breakfast  . mupirocin ointment  1 application Nasal BID  . pantoprazole sodium  40 mg Per Tube Q1200  . piperacillin-tazobactam (ZOSYN)  IV  3.375 g Intravenous Q8H  . potassium chloride  10 mEq Intravenous Q1 Hr x 6  . [START ON 08/07/2014] vancomycin  1,750 mg Intravenous Q48H    Continuous Infusions:    Past Medical History  Diagnosis Date  . Anemia   . Edema   . OSA (obstructive sleep apnea)   . Intention tremor   . Lymphedema   . Atrial fibrillation   . Chronic respiratory failure   . Diabetes   . DDD (degenerative disc disease)   . Respiratory failure   . PNA (pneumonia)   . Congestive heart failure   . Insomnia   . Adenomatous colon polyp   . MRSA (methicillin resistant staph aureus) culture positive   . Hypertension   . Proteinuria   . Hyperlipidemia   . Restless legs   . Idiopathic peripheral neuropathy   . Urinary tract infection     Past Surgical History  Procedure Laterality Date  . Rotator cuff repair    . Cholecystectomy      Burtis Junes RD, LDN Nutrition  Pager: [phone]   08/06/2014 12:42 PM

## 2014-08-06 NOTE — Progress Notes (Signed)
eLink Physician-Brief Progress Note Patient Name: Meagan Campbell DOB: 12/27/1940 MRN: [id number]   Date of Service  08/06/2014  HPI/Events of Note  Hypokalemia  eICU Interventions  Potassium replaced     Intervention Category Intermediate Interventions: Electrolyte abnormality - evaluation and management  Amory Simonetti 08/06/2014, 5:40 AM

## 2014-08-07 ENCOUNTER — Inpatient Hospital Stay (HOSPITAL_COMMUNITY): Payer: Medicare Other

## 2014-08-07 DIAGNOSIS — I480 Paroxysmal atrial fibrillation: Secondary | ICD-10-CM

## 2014-08-07 LAB — GLUCOSE, CAPILLARY
Glucose-Capillary: 114 mg/dL — ABNORMAL HIGH (ref 70–99)
Glucose-Capillary: 130 mg/dL — ABNORMAL HIGH (ref 70–99)
Glucose-Capillary: 137 mg/dL — ABNORMAL HIGH (ref 70–99)
Glucose-Capillary: 139 mg/dL — ABNORMAL HIGH (ref 70–99)
Glucose-Capillary: 146 mg/dL — ABNORMAL HIGH (ref 70–99)
Glucose-Capillary: 147 mg/dL — ABNORMAL HIGH (ref 70–99)

## 2014-08-07 LAB — CBC
HCT: 32.7 % — ABNORMAL LOW (ref 36.0–46.0)
HEMOGLOBIN: 9.7 g/dL — AB (ref 12.0–15.0)
MCH: 26.4 pg (ref 26.0–34.0)
MCHC: 29.7 g/dL — ABNORMAL LOW (ref 30.0–36.0)
MCV: 89.1 fL (ref 78.0–100.0)
PLATELETS: 173 10*3/uL (ref 150–400)
RBC: 3.67 MIL/uL — AB (ref 3.87–5.11)
RDW: 19.6 % — AB (ref 11.5–15.5)
WBC: 7.8 10*3/uL (ref 4.0–10.5)

## 2014-08-07 LAB — BASIC METABOLIC PANEL
ANION GAP: 10 (ref 5–15)
BUN: 38 mg/dL — ABNORMAL HIGH (ref 6–23)
CHLORIDE: 95 mmol/L — AB (ref 96–112)
CO2: 35 mmol/L — ABNORMAL HIGH (ref 19–32)
Calcium: 8.6 mg/dL (ref 8.4–10.5)
Creatinine, Ser: 2.89 mg/dL — ABNORMAL HIGH (ref 0.50–1.10)
GFR calc non Af Amer: 15 mL/min — ABNORMAL LOW (ref >90)
GFR, EST AFRICAN AMERICAN: 17 mL/min — AB (ref >90)
Glucose, Bld: 156 mg/dL — ABNORMAL HIGH (ref 70–99)
Potassium: 3.9 mmol/L (ref 3.5–5.1)
Sodium: 140 mmol/L (ref 135–145)

## 2014-08-07 MED ORDER — ONDANSETRON HCL 4 MG/2ML IJ SOLN
4.0000 mg | Freq: Three times a day (TID) | INTRAMUSCULAR | Status: DC | PRN
  Administered 2014-08-07 – 2014-08-09 (×3): 4 mg via INTRAVENOUS
  Filled 2014-08-07 (×3): qty 2

## 2014-08-07 MED ORDER — SODIUM CHLORIDE 0.9 % IV SOLN
INTRAVENOUS | Status: DC
  Administered 2014-08-07: 16:00:00 via INTRAVENOUS

## 2014-08-07 NOTE — Progress Notes (Signed)
PULMONARY / CRITICAL CARE MEDICINE   Name: Meagan Campbell MRN: [id number] DOB: 15-Feb-1941    ADMISSION DATE:  08/05/2014   REFERRING MD :  [place]  CHIEF COMPLAINT:  FTT  INITIAL PRESENTATION:  74 F with OHS, O2 dependence, CKD, chronic severe edema admitted to [facility] 2/23 with worsening edema. Treated with diuresis and empiric abx. Intubated 3/02. Failed extubation 3/10. Transferred to [facility] 3/11  MAJOR EVENTS/TEST RESULTS:   INDWELLING DEVICES:: ETT 3/02 >> 3/08, 3/10 >>   MICRO DATA: Blood 3/11 >>   ANTIMICROBIALS:  Vanc 3/11 >>  Pip-tazo 3/12 >>    SUBJECTIVE:  RASS -2, + F/C.   VITAL SIGNS: Temp:  [98.6 F (37 C)-99.4 F (37.4 C)] 98.7 F (37.1 C) (03/13 1551) Pulse Rate:  [54-87] 58 (03/13 1600) Resp:  [12-29] 12 (03/13 1600) BP: (80-138)/(17-56) 103/22 mmHg (03/13 1600) SpO2:  [89 %-98 %] 94 % (03/13 1600) FiO2 (%):  [40 %] 40 % (03/13 1532) Weight:  [130.1 kg (286 lb 13.1 oz)] 130.1 kg (286 lb 13.1 oz) (03/13 0500) HEMODYNAMICS:   VENTILATOR SETTINGS: Vent Mode:  [-] PRVC FiO2 (%):  [40 %] 40 % Set Rate:  [12 bmp-14 bmp] 12 bmp Vt Set:  [500 mL] 500 mL PEEP:  [5 cmH20] 5 cmH20 Plateau Pressure:  [20 cmH20-21 cmH20] 21 cmH20 INTAKE / OUTPUT:  Intake/Output Summary (Last 24 hours) at 08/07/14 1730 Last data filed at 08/07/14 1615  Gross per 24 hour  Intake    735 ml  Output   1325 ml  Net   -590 ml    PHYSICAL EXAMINATION: General:  NAD, obese Neuro:  MAEs HEENT: WNL Cardiovascular: Reg, no M Lungs:  Clear anteriorly Abdomen:  Obese, soft, + bs Ext: 2+ symmetric edema  LABS: I have reviewed all of today's lab results. Relevant abnormalities are discussed in the A/P section  CXR: [place] / PLAN:  PULMONARY A: Acute on chronic hypercarbic resp failure Severe OSA/OHS P:   Cont full vent support - settings reviewed and/or adjusted Cont vent bundle Daily SBT if/when meets criteria  CARDIOVASCULAR A:  CAD  PAF,  NSR presently Htn P:  Holding carvedilol Cont enteral amiodarone DC heparin gtt (poor candidate for long term anticoagulation)  RENAL A:   CKD Hypokalemia Anasarca P:   Monitor BMET intermittently Monitor I/Os Correct electrolytes as indicated  GASTROINTESTINAL A:   Obesity P:   SUP: enteral PPI Cont TFs  HEMATOLOGIC A:   Chronic anemia P:  DVT px: SQ heparin Monitor CBC intermittently Transfuse per usual ICU guidelines  INFECTIOUS A:   UTI, treated No apparent active infection P:   Micro and abx as above  ENDOCRINE A:  DM 2 Hypothyroidism, TSH elevated 3/12 P:   Cont SSI Cont L thyroxine - increased 3/12  NEUROLOGIC A:   AMS, hypercarbic P:   RASS goal: 0 Minimize sedative/ananlgesics   FAMILY  Son, husband updated 3/13  30 mins CCM time    Merton Border, MD ; [facility] service Mobile [phone].  After 5:30 PM or weekends, call [phone]  08/07/2014, 5:30 PM

## 2014-08-08 DIAGNOSIS — N179 Acute kidney failure, unspecified: Secondary | ICD-10-CM

## 2014-08-08 DIAGNOSIS — J9622 Acute and chronic respiratory failure with hypercapnia: Principal | ICD-10-CM

## 2014-08-08 DIAGNOSIS — N189 Chronic kidney disease, unspecified: Secondary | ICD-10-CM

## 2014-08-08 LAB — GLUCOSE, CAPILLARY
GLUCOSE-CAPILLARY: 135 mg/dL — AB (ref 70–99)
GLUCOSE-CAPILLARY: 146 mg/dL — AB (ref 70–99)
GLUCOSE-CAPILLARY: 158 mg/dL — AB (ref 70–99)
GLUCOSE-CAPILLARY: 171 mg/dL — AB (ref 70–99)
Glucose-Capillary: 161 mg/dL — ABNORMAL HIGH (ref 70–99)
Glucose-Capillary: 166 mg/dL — ABNORMAL HIGH (ref 70–99)

## 2014-08-08 LAB — LEGIONELLA ANTIGEN, URINE

## 2014-08-08 MED ORDER — FREE WATER
200.0000 mL | Freq: Three times a day (TID) | Status: DC
  Administered 2014-08-08: 200 mL

## 2014-08-08 MED ORDER — DEXTROSE-NACL 5-0.9 % IV SOLN
INTRAVENOUS | Status: DC
  Administered 2014-08-08 – 2014-08-09 (×2): via INTRAVENOUS

## 2014-08-08 MED ORDER — SODIUM CHLORIDE 0.9 % IJ SOLN
10.0000 mL | INTRAMUSCULAR | Status: DC | PRN
  Administered 2014-08-11: 20 mL
  Administered 2014-08-12: 10 mL
  Filled 2014-08-08 (×2): qty 40

## 2014-08-08 MED ORDER — SODIUM CHLORIDE 0.9 % IJ SOLN
10.0000 mL | Freq: Two times a day (BID) | INTRAMUSCULAR | Status: DC
  Administered 2014-08-09 – 2014-08-10 (×3): 10 mL

## 2014-08-08 NOTE — Progress Notes (Signed)
Pt vomited small amount of yellow emesis, Dr. Emmit Alexanders contacted for Zofran order. Zofran 4 mg IVP administered, will continue to monitor.   Babs Bertin RN

## 2014-08-08 NOTE — Progress Notes (Signed)
UR Completed.  [phone]  

## 2014-08-08 NOTE — Progress Notes (Signed)
PULMONARY / CRITICAL CARE MEDICINE   Name: Meagan Campbell MRN: [id number] DOB: 1940/10/13    ADMISSION DATE:  08/05/2014   REFERRING MD :  [place]  CHIEF COMPLAINT:  FTT  INITIAL PRESENTATION:  62 F with OHS, O2 dependence, CKD, chronic severe edema admitted to [facility] 2/23 with worsening edema. Treated with diuresis and empiric abx. Intubated 3/02. Failed extubation 3/10. Transferred to [facility] 3/11  MAJOR EVENTS/TEST RESULTS:   INDWELLING DEVICES:: ETT 3/02 >> 3/08, 3/10 >>   MICRO DATA: Blood 3/11 >>   ANTIMICROBIALS:  Vanc 3/11 >> 3/14 Pip-tazo 3/12 >> 3/14   SUBJECTIVE:  RASS -2, + F/C. Tolerates PS 14 cm H2O  VITAL SIGNS: Temp:  [97.6 F (36.4 C)-98.7 F (37.1 C)] 98 F (36.7 C) (03/14 1126) Pulse Rate:  [54-72] 71 (03/14 1400) Resp:  [12-25] 18 (03/14 1400) BP: (103-157)/(21-93) 157/28 mmHg (03/14 1400) SpO2:  [89 %-100 %] 95 % (03/14 1400) FiO2 (%):  [40 %] 40 % (03/14 1359) Weight:  [128.5 kg (283 lb 4.7 oz)] 128.5 kg (283 lb 4.7 oz) (03/14 0400) HEMODYNAMICS:   VENTILATOR SETTINGS: Vent Mode:  [-] PSV;CPAP FiO2 (%):  [40 %] 40 % Set Rate:  [12 bmp] 12 bmp Vt Set:  [500 mL] 500 mL PEEP:  [5 cmH20] 5 cmH20 Pressure Support:  [14 cmH20] 14 cmH20 Plateau Pressure:  [21 cmH20-26 cmH20] 22 cmH20 INTAKE / OUTPUT:  Intake/Output Summary (Last 24 hours) at 08/08/14 1501 Last data filed at 08/08/14 1400  Gross per 24 hour  Intake    900 ml  Output   1490 ml  Net   -590 ml    PHYSICAL EXAMINATION: General:  NAD, obese Neuro:  MAEs HEENT: WNL Cardiovascular: Reg, no M Lungs:  Clear anteriorly Abdomen:  Obese, soft, + bs Ext: 2+ symmetric edema  LABS: I have reviewed all of today's lab results. Relevant abnormalities are discussed in the A/P section  CXR: [place] scattered areas of atelectasis and consolidation in both lungs  ASSESSMENT / PLAN:  PULMONARY A: Acute on chronic hypercarbic resp failure Severe OSA/OHS P:   Cont full vent  support - settings reviewed and/or adjusted Cont vent bundle Daily SBT if/when meets criteria Discussed possible benefits and adverse consequences of trach tube in detail with family 3/14  CARDIOVASCULAR A:  CAD  PAF, SR presently Htn, controlled P:  Holding carvedilol Cont enteral amiodarone  RENAL A:   CKD AKI Hypokalemia, resolved Anasarca, improving - autodiuresing P:   Monitor BMET intermittently Monitor I/Os Correct electrolytes as indicated  GASTROINTESTINAL A:   Obesity P:   SUP: enteral PPI Cont TFs  HEMATOLOGIC A:   Chronic anemia without overt blood loss P:  DVT px: SQ heparin Monitor CBC intermittently Transfuse per usual ICU guidelines  INFECTIOUS A:   UTI, treated No apparent active infection P:   Micro and abx as above  ENDOCRINE A:  DM 2 Hypothyroidism, TSH elevated 3/12 P:   Cont SSI Cont L thyroxine - increased 3/12  NEUROLOGIC A:   AMS, hypercarbic P:   RASS goal: 0 Minimize sedative/ananlgesics   FAMILY  Sons, husband, daughter and other family members updated 3/14. They are to consider issue of trach and all of its potential implications  40 mins CCM time    Merton Border, MD ; [facility] service Mobile [phone].  After 5:30 PM or weekends, call [phone]  08/08/2014, 3:01 PM

## 2014-08-08 NOTE — Progress Notes (Signed)
Peripherally Inserted Central Catheter/Midline Placement  The IV Nurse has discussed with the patient and/or persons authorized to consent for the patient, the purpose of this procedure and the potential benefits and risks involved with this procedure.  The benefits include less needle sticks, lab draws from the catheter and patient may be discharged home with the catheter.  Risks include, but not limited to, infection, bleeding, blood clot (thrombus formation), and puncture of an artery; nerve damage and irregular heat beat.  Alternatives to this procedure were also discussed.  PICC/Midline Placement Documentation  PICC / Midline Double Lumen 08/08/14 PICC Right Cephalic 44 cm 0 cm (Active)  Indication for Insertion or Continuance of Line Prolonged intravenous therapies 08/08/2014  9:52 PM  Exposed Catheter (cm) 0 cm 08/08/2014  9:52 PM  Dressing Change Due 08/15/14 08/08/2014  9:52 PM       Khristian Phillippi, Maricela Bo 08/08/2014, 9:54 PM

## 2014-08-08 NOTE — Progress Notes (Signed)
eLink Physician-Brief Progress Note Patient Name: Meagan Campbell DOB: 1940-10-15 MRN: [id number]   Date of Service  08/08/2014  HPI/Events of Note  Called d/t patient has vomited X 4 today. Enteral feeds restarted at 5:30 PM today. Patient is on Novolog SSI.  eICU Interventions  Will order: 1. Hold enteral nutrition for tonight. 2. OG tube to low intermittent suction. 3. D5 0.9 NaCl to run IV at 100 mL/hour.      Intervention Category Minor Interventions: Routine modifications to care plan (e.g. PRN medications for pain, fever)  Meagan Campbell 08/08/2014, 7:55 PM

## 2014-08-09 ENCOUNTER — Inpatient Hospital Stay (HOSPITAL_COMMUNITY): Payer: Medicare Other

## 2014-08-09 DIAGNOSIS — N184 Chronic kidney disease, stage 4 (severe): Secondary | ICD-10-CM

## 2014-08-09 LAB — CBC
HCT: 32.3 % — ABNORMAL LOW (ref 36.0–46.0)
Hemoglobin: 9.4 g/dL — ABNORMAL LOW (ref 12.0–15.0)
MCH: 26.1 pg (ref 26.0–34.0)
MCHC: 29.1 g/dL — ABNORMAL LOW (ref 30.0–36.0)
MCV: 89.7 fL (ref 78.0–100.0)
PLATELETS: 127 10*3/uL — AB (ref 150–400)
RBC: 3.6 MIL/uL — AB (ref 3.87–5.11)
RDW: 19.2 % — AB (ref 11.5–15.5)
WBC: 7.2 10*3/uL (ref 4.0–10.5)

## 2014-08-09 LAB — BASIC METABOLIC PANEL
Anion gap: 10 (ref 5–15)
BUN: 34 mg/dL — ABNORMAL HIGH (ref 6–23)
CALCIUM: 9 mg/dL (ref 8.4–10.5)
CHLORIDE: 99 mmol/L (ref 96–112)
CO2: 35 mmol/L — ABNORMAL HIGH (ref 19–32)
Creatinine, Ser: 1.96 mg/dL — ABNORMAL HIGH (ref 0.50–1.10)
GFR calc Af Amer: 28 mL/min — ABNORMAL LOW (ref >90)
GFR calc non Af Amer: 24 mL/min — ABNORMAL LOW (ref >90)
GLUCOSE: 185 mg/dL — AB (ref 70–99)
Potassium: 2.8 mmol/L — ABNORMAL LOW (ref 3.5–5.1)
SODIUM: 144 mmol/L (ref 135–145)

## 2014-08-09 LAB — CULTURE, RESPIRATORY: SPECIAL REQUESTS: NORMAL

## 2014-08-09 LAB — PROTIME-INR
INR: 1.11 (ref 0.00–1.49)
PROTHROMBIN TIME: 14.4 s (ref 11.6–15.2)

## 2014-08-09 LAB — GLUCOSE, CAPILLARY
GLUCOSE-CAPILLARY: 173 mg/dL — AB (ref 70–99)
Glucose-Capillary: 151 mg/dL — ABNORMAL HIGH (ref 70–99)
Glucose-Capillary: 150 mg/dL — ABNORMAL HIGH (ref 70–99)
Glucose-Capillary: 158 mg/dL — ABNORMAL HIGH (ref 70–99)
Glucose-Capillary: 160 mg/dL — ABNORMAL HIGH (ref 70–99)
Glucose-Capillary: 118 mg/dL — ABNORMAL HIGH (ref 70–99)

## 2014-08-09 LAB — CULTURE, RESPIRATORY W GRAM STAIN: Culture: NO GROWTH

## 2014-08-09 LAB — APTT: APTT: 30 s (ref 24–37)

## 2014-08-09 MED ORDER — POTASSIUM CHLORIDE 20 MEQ/15ML (10%) PO SOLN: 40.0000 meq | ORAL | Status: DC

## 2014-08-09 MED ORDER — CETYLPYRIDINIUM CHLORIDE 0.05 % MT LIQD
7.0000 mL | Freq: Two times a day (BID) | OROMUCOSAL | Status: DC
Start: 2014-08-10 — End: 2014-08-10

## 2014-08-09 MED ORDER — CHLORHEXIDINE GLUCONATE 0.12 % MT SOLN: 15.0000 mL | Freq: Two times a day (BID) | OROMUCOSAL | Status: DC

## 2014-08-09 MED ORDER — KCL IN DEXTROSE-NACL 40-5-0.45 MEQ/L-%-% IV SOLN
INTRAVENOUS | Status: DC
  Administered 2014-08-09: 10:00:00 via INTRAVENOUS
  Filled 2014-08-09 (×2): qty 1000

## 2014-08-09 MED ORDER — POTASSIUM CHLORIDE 10 MEQ/50ML IV SOLN
10.0000 meq | INTRAVENOUS | Status: AC
Start: 2014-08-09 — End: 2014-08-09
  Administered 2014-08-09 (×6): 10 meq via INTRAVENOUS
  Filled 2014-08-09 (×3): qty 50

## 2014-08-09 MED ORDER — AMIODARONE HCL 200 MG PO TABS
200.0000 mg | ORAL_TABLET | Freq: Every day | ORAL | Status: DC
  Administered 2014-08-09 – 2014-08-12 (×4): 200 mg via ORAL
  Filled 2014-08-09 (×4): qty 1

## 2014-08-09 NOTE — Procedures (Signed)
Extubation Procedure Note  Patient Details:   Name: Bibiana Gillean DOB: [birth year] MRN: [id number]   Airway Documentation:     Evaluation  O2 sats: stable throughout Complications: No apparent complications Patient did tolerate procedure well. Bilateral Breath Sounds: Diminished Suctioning: Airway Yes   Order received for extubation.  Cuff leak positive prior to extubation.  Extubated to 6L [place].  No complications noted.  No stridor noted, patient able to speak post extubation.  Will continue to monitor.  Phillis Knack [facility] 08/09/2014, 9:41 AM

## 2014-08-09 NOTE — Progress Notes (Signed)
eLink Physician-Brief Progress Note Patient Name: Meagan Campbell DOB: 04/04/1941 MRN: [id number]   Date of Service  08/09/2014  HPI/Events of Note  Hypokalemia  eICU Interventions  Potassium replaced     Intervention Category Intermediate Interventions: Electrolyte abnormality - evaluation and management  DETERDING,ELIZABETH 08/09/2014, 5:25 AM

## 2014-08-09 NOTE — Progress Notes (Signed)
PULMONARY / CRITICAL CARE MEDICINE   Name: Meagan Campbell MRN: [id number] DOB: [birth year]    ADMISSION DATE:  08/05/2014   REFERRING MD :  [place]  CHIEF COMPLAINT:  FTT  INITIAL PRESENTATION:  30 F with OHS, O2 dependence, CKD, chronic severe edema admitted to [facility] 2/23 with worsening edema. Treated with diuresis and empiric abx. Intubated 3/02. Failed extubation 3/10. Transferred to [facility] 3/11  MAJOR EVENTS/TEST RESULTS:   INDWELLING DEVICES:: ETT 3/02 >> 3/08, 3/10 >> 3/15  MICRO DATA: MRSA PCR 3/11 >> POS Blood 3/11 >> NEG Urine 3/11 >> NEG Resp 3/12 >> NEG  ANTIMICROBIALS:  Vanc 3/11 >> 3/14 Pip-tazo 3/12 >> 3/14  SUBJECTIVE:  RASS 0, Passed SBT. Extubated and tolerating  VITAL SIGNS: Temp:  [97.6 F (36.4 C)-98.7 F (37.1 C)] 97.7 F (36.5 C) (03/15 2003) Pulse Rate:  [50-84] 65 (03/15 1900) Resp:  [12-30] 17 (03/15 1900) BP: (90-152)/(20-75) 98/43 mmHg (03/15 1900) SpO2:  [92 %-100 %] 95 % (03/15 1900) FiO2 (%):  [40 %] 40 % (03/15 0900) Weight:  [126.9 kg (279 lb 12.2 oz)] 126.9 kg (279 lb 12.2 oz) (03/15 0200) HEMODYNAMICS:   VENTILATOR SETTINGS: Vent Mode:  [-] PSV;CPAP FiO2 (%):  [40 %] 40 % Set Rate:  [12 bmp] 12 bmp Vt Set:  [500 mL] 500 mL PEEP:  [5 cmH20] 5 cmH20 Pressure Support:  [8 cmH20] 8 cmH20 Plateau Pressure:  [23 cmH20] 23 cmH20 INTAKE / OUTPUT:  Intake/Output Summary (Last 24 hours) at 08/09/14 2036 Last data filed at 08/09/14 1900  Gross per 24 hour  Intake 2070.83 ml  Output   1595 ml  Net 475.83 ml    PHYSICAL EXAMINATION: General:  NAD, obese Neuro:  MAEs HEENT: WNL Cardiovascular: Reg, no M Lungs:  Clear anteriorly Abdomen:  Obese, soft, + bs Ext: 2+ symmetric edema  LABS: I have reviewed all of today's lab results. Relevant abnormalities are discussed in the A/P section  CXR: low volumes  ASSESSMENT / PLAN:  PULMONARY A: Acute on chronic hypercarbic resp failure Severe OSA/OHS P:   Monitor  in ICU post extubation Careful O2 titration SpO2 goal 88-93%  CARDIOVASCULAR A:  CAD, quiescent PAF > NSR presently Htn, controlled P:  Holding carvedilol Cont amiodarone  RENAL A:   CKD AKI - improving Hypokalemia, recurrent Anasarca, improving - autodiuresing P:   Monitor BMET intermittently Monitor I/Os Correct electrolytes as indicated  GASTROINTESTINAL A:   Obesity P:   SUP: N/I post ext NPO post extubation Consider nutrition 3/16 AM  HEMATOLOGIC A:   Chronic anemia without overt blood loss P:  DVT px: SQ heparin Monitor CBC intermittently Transfuse per usual ICU guidelines  INFECTIOUS A:   UTI, treated No apparent active infection P:   Micro and abx as above  ENDOCRINE A:  DM 2 Hypothyroidism, TSH elevated 3/12 P:   Cont SSI Cont L thyroxine - increased 3/12  NEUROLOGIC A:   AMS, hypercarbic P:   RASS goal: 0 Minimize sedative/analgesics   FAMILY  Sons, husband, daughter and other family members updated.   35 mins CCM time   Merton Border, MD ; [facility] [phone].  After 5:30 PM or weekends, call [phone]  08/09/2014, 8:36 PM

## 2014-08-10 ENCOUNTER — Encounter (HOSPITAL_COMMUNITY): Payer: Self-pay | Admitting: General Practice

## 2014-08-10 ENCOUNTER — Inpatient Hospital Stay (HOSPITAL_COMMUNITY): Payer: Medicare Other

## 2014-08-10 DIAGNOSIS — G7281 Critical illness myopathy: Secondary | ICD-10-CM

## 2014-08-10 LAB — CBC
HEMATOCRIT: 33.3 % — AB (ref 36.0–46.0)
Hemoglobin: 9.5 g/dL — ABNORMAL LOW (ref 12.0–15.0)
MCH: 26.3 pg (ref 26.0–34.0)
MCHC: 28.5 g/dL — ABNORMAL LOW (ref 30.0–36.0)
MCV: 92.2 fL (ref 78.0–100.0)
Platelets: 177 10*3/uL (ref 150–400)
RBC: 3.61 MIL/uL — ABNORMAL LOW (ref 3.87–5.11)
RDW: 19.2 % — AB (ref 11.5–15.5)
WBC: 6.8 10*3/uL (ref 4.0–10.5)

## 2014-08-10 LAB — GLUCOSE, CAPILLARY
GLUCOSE-CAPILLARY: 149 mg/dL — AB (ref 70–99)
GLUCOSE-CAPILLARY: 164 mg/dL — AB (ref 70–99)
Glucose-Capillary: 135 mg/dL — ABNORMAL HIGH (ref 70–99)
Glucose-Capillary: 133 mg/dL — ABNORMAL HIGH (ref 70–99)
Glucose-Capillary: 212 mg/dL — ABNORMAL HIGH (ref 70–99)

## 2014-08-10 LAB — BASIC METABOLIC PANEL
Anion gap: 6 (ref 5–15)
BUN: 24 mg/dL — ABNORMAL HIGH (ref 6–23)
CALCIUM: 9 mg/dL (ref 8.4–10.5)
CO2: 35 mmol/L — ABNORMAL HIGH (ref 19–32)
Chloride: 103 mmol/L (ref 96–112)
Creatinine, Ser: 1.54 mg/dL — ABNORMAL HIGH (ref 0.50–1.10)
GFR calc non Af Amer: 32 mL/min — ABNORMAL LOW (ref >90)
GFR, EST AFRICAN AMERICAN: 37 mL/min — AB (ref >90)
Glucose, Bld: 166 mg/dL — ABNORMAL HIGH (ref 70–99)
POTASSIUM: 3.7 mmol/L (ref 3.5–5.1)
Sodium: 144 mmol/L (ref 135–145)

## 2014-08-10 MED ORDER — ENOXAPARIN SODIUM 40 MG/0.4ML ~~LOC~~ SOLN
40.0000 mg | SUBCUTANEOUS | Status: DC
  Administered 2014-08-10 – 2014-08-12 (×3): 40 mg via SUBCUTANEOUS
  Filled 2014-08-10 (×3): qty 0.4

## 2014-08-10 MED ORDER — POTASSIUM CHLORIDE CRYS ER 20 MEQ PO TBCR
40.0000 meq | EXTENDED_RELEASE_TABLET | Freq: Once | ORAL | Status: AC
  Administered 2014-08-10: 40 meq via ORAL
  Filled 2014-08-10: qty 2

## 2014-08-10 MED ORDER — INSULIN ASPART 100 UNIT/ML ~~LOC~~ SOLN
4.0000 [IU] | Freq: Three times a day (TID) | SUBCUTANEOUS | Status: DC
  Administered 2014-08-10 – 2014-08-12 (×6): 4 [IU] via SUBCUTANEOUS

## 2014-08-10 MED ORDER — LEVOTHYROXINE SODIUM 50 MCG PO TABS
75.0000 ug | ORAL_TABLET | Freq: Every day | ORAL | Status: DC
  Administered 2014-08-11 – 2014-08-12 (×2): 75 ug via ORAL
  Filled 2014-08-10 (×5): qty 1

## 2014-08-10 MED ORDER — BUDESONIDE 0.25 MG/2ML IN SUSP
0.2500 mg | Freq: Four times a day (QID) | RESPIRATORY_TRACT | Status: DC
  Administered 2014-08-10 – 2014-08-12 (×6): 0.25 mg via RESPIRATORY_TRACT
  Filled 2014-08-10 (×16): qty 2

## 2014-08-10 MED ORDER — INSULIN ASPART 100 UNIT/ML ~~LOC~~ SOLN: 0.0000 [IU] | Freq: Every day | SUBCUTANEOUS | Status: DC

## 2014-08-10 MED ORDER — INSULIN ASPART 100 UNIT/ML ~~LOC~~ SOLN
0.0000 [IU] | Freq: Three times a day (TID) | SUBCUTANEOUS | Status: DC
  Administered 2014-08-10: 5 [IU] via SUBCUTANEOUS
  Administered 2014-08-11 (×3): 3 [IU] via SUBCUTANEOUS
  Administered 2014-08-12: 5 [IU] via SUBCUTANEOUS
  Administered 2014-08-12: 3 [IU] via SUBCUTANEOUS

## 2014-08-10 NOTE — Care Management Note (Signed)
    Page 1 of 1   08/10/2014     11:55:50 AM CARE MANAGEMENT NOTE 08/10/2014  Patient:  Meagan Campbell,Meagan Campbell   Account Number:  [id number]  Date Initiated:  08/05/2014  Documentation initiated by:  Luz Lex  Subjective/Objective Assessment:   Admitted from outside facility.  Already on vent     Action/Plan:   Anticipated DC Date:  08/12/2014   Anticipated DC Plan:  [place]  CM consult      Choice offered to / List presented to:             Status of service:  In process, will continue to follow Medicare Important Message given?  YES (If response is "NO", the following Medicare IM given date fields will be blank) Date Medicare IM given:  08/09/2014 Medicare IM given by:  [facility] Date Additional Medicare IM given:   Additional Medicare IM given by:    Discharge Disposition:    Per UR Regulation:  Reviewed for med. necessity/level of care/duration of stay  If discussed at [place] of Stay Meetings, dates discussed:   08/11/2014    Comments:  Contact:  Meagan Campbell,Meagan Campbell [phone]  [phone]    [facility] Daughter [phone]     [facility]   [phone]   Meagan Campbell, Meagan Campbell   [phone]  08-09-14 Lajuan Lines, RNBSN [phone] Patient extubated earlier today.  On [place] - Awake and alert. Per patient lives at home with husband.  Is able to care for self as far as walking and getting to bathroom. Husband usually assists with dressing, transportation and meals.  Hopes to get back home to this level.  Is talking with grandaughter about needing a trach??  Doesn't know if she needs - states does not want a feeding tube.  Left patient to continue talking with grandaughter about this. CM will continue to follow.

## 2014-08-10 NOTE — Progress Notes (Signed)
Physical Therapy Evaluation Patient Details Name: Meagan Campbell MRN: [id number] DOB: May 01, 1941 Today's Date: 08/10/2014   History of Present Illness  78 F with OHS, O2 dependence, CKD, chronic severe edema admitted to [facility] 2/23 with worsening edema. Treated with diuresis and empiric abx. Intubated 3/02. Failed extubation 3/10. Transferred to [facility] 3/11. Extubated 3/15.  Clinical Impression  Pt admitted with the above complications. Pt currently with functional limitations due to the deficits listed below (see PT Problem List). Patient required min-mod assist +2 for bed mobility and transfers today. Limited by lightheadedness and SOB. 4L supplemental O2 needed to maintain SpO2 of 90% and greater while working with therapy. Pt is motivated to improve her functional independence. Lives with her husband who provides assist with bathing at baseline, however states she ambulates in community with a rollator. Pt will benefit from skilled PT to increase their independence and safety with mobility to allow discharge to the venue listed below.       Follow Up Recommendations CIR    Equipment Recommendations  Other (comment) (TBD)    Recommendations for Other Services Rehab consult;OT consult     Precautions / Restrictions Precautions Precautions: Fall Restrictions Weight Bearing Restrictions: No      Mobility  Bed Mobility Overal bed mobility: Needs Assistance Bed Mobility: Supine to Sit     Supine to sit: Min assist;HOB elevated     General bed mobility comments: min assist for support through hand to allow pt to pull self into seated position. Able to scoot to edge of bed without assist. Requires extra time. Cues to breath frequently.  Transfers Overall transfer level: Needs assistance Equipment used: Rolling walker (2 wheeled) Transfers: Sit to/from [facility] Sit to Stand: Min assist;+2 physical assistance;From elevated surface Stand pivot transfers: Mod  assist;+2 physical assistance       General transfer comment: Min assist +2 for boost to stand and stability for posterior lean initially. Pivoting well until very end required Mod assist to control descent into chair as pt began to rush. Stating she felt lightheaded.  Ambulation/Gait                Stairs            Wheelchair Mobility    Modified Rankin (Stroke Patients Only)       Balance Overall balance assessment: Needs assistance Sitting-balance support: No upper extremity supported;Feet supported Sitting balance-Leahy Scale: Fair     Standing balance support: Bilateral upper extremity supported Standing balance-Leahy Scale: Poor                               Pertinent Vitals/Pain Pain Assessment: No/denies pain  Vitals in comments below    Home Living Family/patient expects to be discharged to:: Private residence Living Arrangements: Spouse/significant other Available Help at Discharge: Family;Available 24 hours/day Type of Home: House Home Access: Ramped entrance     Home Layout: One level Home Equipment: Walker - 2 wheels;Walker - 4 wheels;Wheelchair - [employer];Shower seat      Prior Function Level of Independence: Needs assistance   Gait / Transfers Assistance Needed: Ambulates with RW in house, rollator outside.  ADL's / Homemaking Assistance Needed: Husband would assist with bath.        Hand Dominance   Dominant Hand: Left    Extremity/Trunk Assessment   Upper Extremity Assessment: Defer to OT evaluation  Lower Extremity Assessment: Generalized weakness         Communication   Communication: No difficulties  Cognition Arousal/Alertness: Awake/alert Behavior During Therapy: WFL for tasks assessed/performed Overall Cognitive Status: Within Functional Limits for tasks assessed                      General Comments General comments (skin integrity, edema, etc.): Supine at  rest:97% on 2L supplemental O2, HR 72, BP 131/33 (Lt arm) --- After transfer pt lightheaded and SOB: HR 90, SpO2 66% briefly on room air, improves to 96% on 4L with cues for pursed lip breathing, 94% on 2L supplemental O2, BP 98/86.    Exercises General Exercises - Lower Extremity Ankle Circles/Pumps: AROM;Both;10 reps;Supine Quad Sets: Strengthening;Both;10 reps;Seated Straight Leg Raises: Strengthening;Both;10 reps;Seated      Assessment/Plan    PT Assessment Patient needs continued PT services  PT Diagnosis Difficulty walking;Generalized weakness   PT Problem List Decreased strength;Decreased range of motion;Decreased activity tolerance;Decreased balance;Decreased mobility;Decreased knowledge of use of DME;Cardiopulmonary status limiting activity;Obesity  PT Treatment Interventions DME instruction;Gait training;Functional mobility training;Therapeutic activities;Therapeutic exercise;Balance training;Neuromuscular re-education;Patient/family education   PT Goals (Current goals can be found in the Care Plan section) Acute Rehab PT Goals Patient Stated Goal: Go to rehab PT Goal Formulation: With patient Time For Goal Achievement: 08/24/14 Potential to Achieve Goals: Good    Frequency Min 3X/week   Barriers to discharge        Co-evaluation               End of Session Equipment Utilized During Treatment: Gait belt;Oxygen Activity Tolerance: Other (comment);Treatment limited secondary to medical complications (Comment) (limited by lightheadedness) Patient left: in chair;with call bell/phone within reach Nurse Communication: Mobility status (vitals)         Time: 7824-2353 PT Time Calculation (min) (ACUTE ONLY): 32 min   Charges:   PT Evaluation $Initial PT Evaluation Tier I: 1 Procedure PT Treatments $Therapeutic Activity: 8-22 mins   PT G CodesEllouise Newer 08/10/2014, 9:40 AM Elayne Snare, [place]

## 2014-08-10 NOTE — Progress Notes (Signed)
Rehab Admissions Coordinator Note:  Patient was screened by Cleatrice Burke for appropriateness for an Inpatient Acute Rehab Consult. Per PT recommendation.  At this time, we await completion of rehab consultation. Nanetta Batty, Admissions Coordinator will follow at [phone].  Cleatrice Burke 08/10/2014, 11:51 AM  I can be reached at [phone].

## 2014-08-10 NOTE — Progress Notes (Signed)
PULMONARY / CRITICAL CARE MEDICINE   Name: Meagan Campbell MRN: [id number] DOB: 12/01/1940    ADMISSION DATE:  08/05/2014   REFERRING MD :  [place]  CHIEF COMPLAINT:  FTT  INITIAL PRESENTATION:  20 F with OHS, O2 dependence, CKD, chronic severe edema admitted to [facility] 2/23 with worsening edema. Treated with diuresis and empiric abx. Intubated 3/02. Failed extubation 3/10. Transferred to [facility] 3/11   INDWELLING DEVICES:: ETT 3/02 >> 3/08, 3/10 >> 3/15  MICRO DATA: MRSA PCR 3/11 >> POS Blood 3/11 >> NEG Urine 3/11 >> NEG Resp 3/12 >> NEG  ANTIMICROBIALS:  Vanc 3/11 >> 3/14 Pip-tazo 3/12 >> 3/14  SUBJECTIVE:  Has tolerated extubation well. No new complaints. No distress  VITAL SIGNS: Temp:  [97.6 F (36.4 C)-98.4 F (36.9 C)] 98.2 F (36.8 C) (03/16 1139) Pulse Rate:  [53-110] 108 (03/16 1139) Resp:  [15-33] 22 (03/16 1139) BP: (98-151)/(23-88) 142/70 mmHg (03/16 1139) SpO2:  [85 %-100 %] 88 % (03/16 1139) Weight:  [127.9 kg (281 lb 15.5 oz)] 127.9 kg (281 lb 15.5 oz) (03/16 0400) HEMODYNAMICS:   VENTILATOR SETTINGS:   INTAKE / OUTPUT:  Intake/Output Summary (Last 24 hours) at 08/10/14 1329 Last data filed at 08/10/14 1100  Gross per 24 hour  Intake   1200 ml  Output    930 ml  Net    270 ml    PHYSICAL EXAMINATION: General:  NAD, obese Neuro:  MAEs HEENT: WNL Cardiovascular: Reg, no M Lungs:  Clear anteriorly Abdomen:  Obese, soft, + bs Ext: 2+ symmetric edema  LABS: I have reviewed all of today's lab results. Relevant abnormalities are discussed in the A/P section  CXR: [place] / PLAN:  PULMONARY A: Acute on chronic hypercarbic resp failure Severe OSA/OHS P:  Careful O2 titration SpO2 goal 88-93% Cont nebulized BDs Add nebulized steroids 3/16  CARDIOVASCULAR A:  CAD, quiescent PAF > NSR presently Htn, controlled P:  Holding carvedilol Cont amiodarone  RENAL A:   CKD AKI - improving Hypokalemia,  recurrent Anasarca, improving - autodiuresing P:   Monitor BMET intermittently Monitor I/Os Correct electrolytes as indicated Consider careful diuresis   GASTROINTESTINAL A:   Obesity P:   SUP: N/I post ext Begin diet 3/16  HEMATOLOGIC A:   Chronic anemia without overt blood loss P:  DVT px: SQ LMWH Monitor CBC intermittently Transfuse per usual ICU guidelines  INFECTIOUS A:   UTI, treated No apparent active infection P:   Micro and abx as above  ENDOCRINE A:  DM 2 Hypothyroidism, TSH elevated 3/12 P:   Cont SSI Cont L thyroxine - increased 3/12 Recheck TSH 3/19  NEUROLOGIC A:   AMS, hypercarbic - resolved Deconditioning P:   RASS goal: 0 Minimize sedative/analgesics PT eval and rx CIR eval -  Would likely benefit from Rehab   Transfer to [facility] 3/16. Remains on PCCM service   Merton Border, MD ; [facility] service Mobile [phone].  After 5:30 PM or weekends, call [phone]  08/10/2014, 1:29 PM

## 2014-08-10 NOTE — Progress Notes (Signed)
Physical medicine rehabilitation consult requested and chart reviewed. Plan to await physical and occupational therapy evaluation and follow-up with appropriate recommendations at that time.

## 2014-08-10 NOTE — Consult Note (Signed)
Physical Medicine and Rehabilitation Consult Reason for Consult: Debilitation related to respiratory failure Referring Physician: Critical care   HPI: Meagan Campbell is a 74 y.o. right handed female with history of severe OHS/BiPAP dependent, COPD on oxygen for many years, chronic kidney disease with baseline creatinine 8.41, CHF, diastolic congestive heart failure. Independent prior to admission using a walker living with her husband. Presented presented to [facility] 07/19/2014 with increased weight gain and shortness of breath. Treated with aggressive diuresis as well as findings of urinary tract infection placed on broad-spectrum antibiotics. Developed increased shortness of breath 07/27/2014 with respiratory arrest and was intubated. Extubated 08/04/2014 but required reintubation and transferred to [facility] 08/05/2014 for ongoing care per critical care medicine. Hospital course MRSA PCR screening positive. Patient was extubated 08/09/2014 with careful O2 titration. Subcutaneous Lovenox for DVT prophylaxis. Remains on amiodarone for history of atrial fibrillation. Tolerating a regular diet. Physical therapy evaluation completed 08/10/2014. Patient noted still to be limited by endurance fatigue factors. Recommendations made for physical medicine rehabilitation consult.   Review of Systems  Respiratory: Positive for shortness of breath.   Cardiovascular: Positive for palpitations and leg swelling.  Psychiatric/Behavioral: The patient has insomnia.   All other systems reviewed and are negative.  Past Medical History  Diagnosis Date  . Anemia   . Edema   . OSA (obstructive sleep apnea)   . Intention tremor   . Lymphedema   . Atrial fibrillation   . Chronic respiratory failure   . Diabetes   . DDD (degenerative disc disease)   . Respiratory failure   . PNA (pneumonia)   . Congestive heart failure   . Insomnia   . Adenomatous colon polyp   . MRSA (methicillin  resistant staph aureus) culture positive   . Hypertension   . Proteinuria   . Hyperlipidemia   . Restless legs   . Idiopathic peripheral neuropathy   . Urinary tract infection    Past Surgical History  Procedure Laterality Date  . Rotator cuff repair    . Cholecystectomy     Family History  Problem Relation Age of Onset  . Diabetes Sister   . Hypertension Sister   . Cirrhosis Father   . Cancer - Colon Mother   . Cancer Brother     rectal cancer   Social History:  reports that she has never smoked. She has never used smokeless tobacco. She reports that she does not drink alcohol or use illicit drugs. Allergies:  Allergies  Allergen Reactions  . Codeine     nausea  . Darvon [Propoxyphene]     nausea   Medications Prior to Admission  Medication Sig Dispense Refill  . ACCU-CHEK AVIVA PLUS test strip Use as directed    . amiodarone (PACERONE) 200 MG tablet Take 200 mg by mouth daily.    Marland Kitchen aspirin 325 MG tablet Take 325 mg by mouth daily.    . Blood Glucose Calibration (ACCU-CHEK AVIVA) SOLN Use as directed    . Blood Glucose Monitoring Suppl (ACCU-CHEK AVIVA PLUS) W/DEVICE KIT Use as directed    . carbidopa-levodopa (SINEMET IR) 25-250 MG per tablet Take 1 tablet by mouth 3 (three) times daily.    . carvedilol (COREG) 6.25 MG tablet Take 6.25 mg by mouth 2 (two) times daily.    . ferrous sulfate 325 (65 FE) MG tablet Take 325 mg by mouth daily with breakfast.    . fosinopril (MONOPRIL) 40 MG tablet Take 40 mg  by mouth daily.    . furosemide (LASIX) 40 MG tablet Take 80 mg by mouth 2 (two) times daily.  0  . furosemide (LASIX) 80 MG tablet Take 120 mg by mouth 2 (two) times daily.    Marland Kitchen gabapentin (NEURONTIN) 300 MG capsule Take 300 mg by mouth daily.     . hydrALAZINE (APRESOLINE) 50 MG tablet Take 50 mg by mouth 3 (three) times daily.    . insulin aspart protamine- aspart (NOVOLOG MIX 70/30) (70-30) 100 UNIT/ML injection Inject 60 Units into the skin 2 (two) times daily with  a meal.     . ipratropium-albuterol (DUONEB) 0.5-2.5 (3) MG/3ML SOLN Take 3 mLs by nebulization 2 (two) times daily.     . isosorbide mononitrate (IMDUR) 30 MG 24 hr tablet Take 30 mg by mouth daily.    Marland Kitchen levothyroxine (SYNTHROID, LEVOTHROID) 50 MCG tablet Take 50 mcg by mouth daily.  0  . magnesium oxide (MAG-OX) 400 MG tablet Take 400 mg by mouth 2 (two) times daily.    . Magnesium Oxide 400 (240 MG) MG TABS Take 1 tablet by mouth 2 (two) times daily.  3  . Melatonin 5 MG TABS Take 1 tablet by mouth at bedtime.     Marland Kitchen nystatin (MYCOSTATIN/NYSTOP) 100000 UNIT/GM POWD Apply topically as needed.   2  . Omega-3 Fatty Acids (FISH OIL CONCENTRATE PO) Take 2 capsules by mouth 2 (two) times daily.     . ondansetron (ZOFRAN) 4 MG tablet Take 1 tablet by mouth every 4 (four) hours as needed for nausea or vomiting.     Marland Kitchen rOPINIRole (REQUIP) 4 MG tablet Take 4 mg by mouth at bedtime. 2 tabs at bed time    . simvastatin (ZOCOR) 40 MG tablet Take 40 mg by mouth daily.    Marland Kitchen ULTILET CLASSIC LANCETS MISC by Does not apply route.      Home: Home Living Family/patient expects to be discharged to:: Private residence Living Arrangements: Spouse/significant other Available Help at Discharge: Family, Available 24 hours/day Type of Home: House Home Access: [place]: One [place]: Environmental consultant - 2 wheels, Environmental consultant - 4 wheels, Wheelchair - manual, Bedside commode, Shower seat  Functional History: Prior Function Level of Independence: Needs assistance Gait / Transfers Assistance Needed: Ambulates with RW in house, rollator outside. ADL's / Homemaking Assistance Needed: Husband would assist with bath. Functional Status:  Mobility: Bed Mobility Overal bed mobility: Needs Assistance Bed Mobility: Supine to Sit Supine to sit: Min assist, HOB elevated General bed mobility comments: min assist for support through hand to allow pt to pull self into seated position. Able to scoot to edge of  bed without assist. Requires extra time. Cues to breath frequently. Transfers Overall transfer level: Needs assistance Equipment used: Rolling walker (2 wheeled) Transfers: Sit to/from Stand, Stand Pivot Transfers Sit to Stand: Min assist, +2 physical assistance, From elevated surface Stand pivot transfers: Mod assist, +2 physical assistance General transfer comment: Min assist +2 for boost to stand and stability for posterior lean initially. Pivoting well until very end required Mod assist to control descent into chair as pt began to rush. Stating she felt lightheaded.      ADL:    Cognition: Cognition Overall Cognitive Status: Within Functional Limits for tasks assessed Orientation Level: Oriented X4 Cognition Arousal/Alertness: Awake/alert Behavior During Therapy: WFL for tasks assessed/performed Overall Cognitive Status: Within Functional Limits for tasks assessed  Blood pressure 142/70, pulse 108, temperature 98.2 F (36.8 C), temperature  source Oral, resp. rate 22, height $RemoveBe'5\' 6"'dscHvgfbD$  (1.676 m), weight 127.9 kg (281 lb 15.5 oz), SpO2 88 %. Physical Exam  Vitals reviewed. Constitutional: She is oriented to person, place, and time.  74 year old obese female  HENT:  Head: Normocephalic.  Eyes: EOM are normal.  Neck: Normal range of motion. Neck supple. No thyromegaly present.  Cardiovascular:  Cardiac rate controlled  Respiratory:  Lungs decreased breath sounds at the bases with good inspiratory effort  GI: Soft. Bowel sounds are normal. She exhibits no distension.  Musculoskeletal:  +1 edema lower extremities.   Neurological: She is alert and oriented to person, place, and time. A cranial nerve deficit is present. She exhibits normal muscle tone.  MMT-- Right: 3/5 deltoid, 3/5 bicep, 3/5 tricep, 4/5 wrist extension, 4/5 hand intrinsics.      1+/5 hip flexor, 2/5 knee extension, 3/5 ankle dorsiflexion, 3/5 ankle plantarflexion.  Left: 3/5 deltoid, 3/5 bicep, 3/5 tricep, 4/5 wrist  extension, 4/5 hand intrinsics.      2-/5 hip flexor, 2/5 knee extension, 3/5 ankle dorsiflexion, 3/5 ankle plantarflexion. No sensory deficits   Skin: Skin is warm and dry.  Psychiatric: She has a normal mood and affect. Her behavior is normal. Judgment and thought content normal.    Results for orders placed or performed during the hospital encounter of 08/05/14 (from the past 24 hour(s))  Glucose, capillary     Status: Abnormal   Collection Time: 08/09/14  3:55 PM  Result Value Ref Range   Glucose-Capillary 160 (H) 70 - 99 mg/dL  Glucose, capillary     Status: Abnormal   Collection Time: 08/09/14  7:14 PM  Result Value Ref Range   Glucose-Capillary 118 (H) 70 - 99 mg/dL  Glucose, capillary     Status: Abnormal   Collection Time: 08/09/14 11:15 PM  Result Value Ref Range   Glucose-Capillary 135 (H) 70 - 99 mg/dL   Comment 1 Notify RN   Glucose, capillary     Status: Abnormal   Collection Time: 08/10/14  3:52 AM  Result Value Ref Range   Glucose-Capillary 149 (H) 70 - 99 mg/dL   Comment 1 Notify RN   Basic metabolic panel     Status: Abnormal   Collection Time: 08/10/14  5:14 AM  Result Value Ref Range   Sodium 144 135 - 145 mmol/L   Potassium 3.7 3.5 - 5.1 mmol/L   Chloride 103 96 - 112 mmol/L   CO2 35 (H) 19 - 32 mmol/L   Glucose, Bld 166 (H) 70 - 99 mg/dL   BUN 24 (H) 6 - 23 mg/dL   Creatinine, Ser 1.54 (H) 0.50 - 1.10 mg/dL   Calcium 9.0 8.4 - 10.5 mg/dL   GFR calc non Af Amer 32 (L) >90 mL/min   GFR calc Af Amer 37 (L) >90 mL/min   Anion gap 6 5 - 15  CBC     Status: Abnormal   Collection Time: 08/10/14  5:14 AM  Result Value Ref Range   WBC 6.8 4.0 - 10.5 K/uL   RBC 3.61 (L) 3.87 - 5.11 MIL/uL   Hemoglobin 9.5 (L) 12.0 - 15.0 g/dL   HCT 33.3 (L) 36.0 - 46.0 %   MCV 92.2 78.0 - 100.0 fL   MCH 26.3 26.0 - 34.0 pg   MCHC 28.5 (L) 30.0 - 36.0 g/dL   RDW 19.2 (H) 11.5 - 15.5 %   Platelets 177 150 - 400 K/uL  Glucose, capillary     Status: Abnormal  Collection  Time: 08/10/14  8:19 AM  Result Value Ref Range   Glucose-Capillary 133 (H) 70 - 99 mg/dL   Dg Chest Port 1 View  08/10/2014   CLINICAL DATA:  Respiratory failure  EXAM: PORTABLE CHEST - 1 VIEW  COMPARISON:  08/09/2014; 08/07/2014; 08/06/2014  FINDINGS: Grossly unchanged enlarged cardiac silhouette and mediastinal contour given persistently reduced lung volumes. Atherosclerotic plaque within the thoracic aorta is. Interval extubation and removal of enteric tube. Otherwise, stable positioning of remaining support apparatus. No pneumothorax. Pulmonary vasculature remains indistinct with cephalization of flow. Heterogeneous opacities within the right mid and left lower lung are unchanged. No definite pleural effusion. Unchanged bones including old left-sided rib fractures.  IMPRESSION: 1. Interval extubation and removal of enteric tube. Otherwise, stable positioning of remaining support apparatus. No pneumothorax. 2. Similar findings of pulmonary edema and bibasilar opacities, left greater than right, atelectasis versus infiltrate.   Electronically Signed   By: Sandi Mariscal M.D.   On: 08/10/2014 07:11   Dg Chest Port 1 View  08/09/2014   CLINICAL DATA:  Hypoxia/respiratory failure  EXAM: PORTABLE CHEST - 1 VIEW  COMPARISON:  August 07, 2014  FINDINGS: Endotracheal tube tip is 3.0 cm above the carina. Central catheter tip is in the right atrium. Nasogastric tube tip and side port are below the diaphragm. No pneumothorax. There is patchy bibasilar atelectatic change, slightly more on the left than on the right. Lungs elsewhere clear. Heart is mildly enlarged with pulmonary vascularity within normal limits. There is postoperative change in the lower cervical spine region.  IMPRESSION: Patchy bibasilar atelectasis. Tube and catheter positions as described without pneumothorax. No change in cardiac silhouette.   Electronically Signed   By: Lowella Grip III M.D.   On: 08/09/2014 07:31     Assessment/Plan: Diagnosis: Critical illness myopathy 1. Does the need for close, 24 hr/day medical supervision in concert with the patient's rehab needs make it unreasonable for this patient to be served in a less intensive setting? Yes 2. Co-Morbidities requiring supervision/potential complications: OSA, CHF, COPD, morbid obesity 3. Due to bladder management, bowel management, safety, skin/wound care, disease management, medication administration, pain management and patient education, does the patient require 24 hr/day rehab nursing? Yes 4. Does the patient require coordinated care of a physician, rehab nurse, PT (1-2 hrs/day, 5 days/week) and OT (1-2 hrs/day, 5 days/week) to address physical and functional deficits in the context of the above medical diagnosis(es)? Yes Addressing deficits in the following areas: balance, endurance, locomotion, strength, transferring, bowel/bladder control, bathing, dressing, feeding, grooming, toileting and psychosocial support 5. Can the patient actively participate in an intensive therapy program of at least 3 hrs of therapy per day at least 5 days per week? Yes 6. The potential for patient to make measurable gains while on inpatient rehab is excellent 7. Anticipated functional outcomes upon discharge from inpatient rehab are modified independent and supervision  with PT, modified independent and supervision with OT, n/a with SLP. 8. Estimated rehab length of stay to reach the above functional goals is: 12-18 days 9. Does the patient have adequate social supports and living environment to accommodate these discharge functional goals? Yes 10. Anticipated D/C setting: Home 11. Anticipated post D/C treatments: [place] therapy 12. Overall Rehab/Functional Prognosis: excellent  RECOMMENDATIONS: This patient's condition is appropriate for continued rehabilitative care in the following setting: CIR Patient has agreed to participate in recommended program. Yes Note  that insurance prior authorization may be required for reimbursement for recommended care.  Comment:  Rehab Admissions Coordinator to follow up.  Thanks,  Meredith Staggers, MD, Mellody Drown     08/10/2014

## 2014-08-10 NOTE — Progress Notes (Signed)
RN walked past patient's room. Entered and reeducated patient and family on the use of gown and gloves in a contact room. Family did not want to put on gown and gloves, as they have ", Already been around her this long." RN instructed family to perform hand hygiene before leaving patient's room.

## 2014-08-10 NOTE — Progress Notes (Signed)
NUTRITION FOLLOW UP  DOCUMENTATION CODES Per approved criteria  -Morbid Obesity   INTERVENTION: -Monitor PO intake  NUTRITION DIAGNOSIS: Inadequate oral intake related to inability to eat as evidenced by NPO status; resolved.   Goal: Pt to meet >/= 90% of estimated needs; progressing.  Monitor:  PO intake, weight trends, labs   74 y.o. female   ASSESSMENT: Patient presented to [facility] on 2/23 with increased weight gain, hypercarbia, and SOB. She required intubation on 3/2, extubated 3/10 and required reintubation. Transferred to [facility] on 3/10 for possible trach and PEG.  Pt PMH of COPD, CKD stage 4, CAF, CHF.   Extubated 3/15, currently on HH/CHO mod diet.  Pt consumed approximately 75% of breakfast this AM.  Pt states she is hungry and predicts she will have a good appetite.  Pt reports no changes in weight or appetite at home PTA.  No signs of fat or muscle wasting.    Pt does not feel she needs supplementation at this time.   Height: Ht Readings from Last 1 Encounters:  08/05/14 5\' 6"  (1.676 m)    Weight: Wt Readings from Last 1 Encounters:  08/10/14 281 lb 15.5 oz (127.9 kg)    Ideal Body Weight: 59.1 kg  % Ideal Body Weight: 216%  BMI:  Body mass index is 45.53 kg/(m^2). Class III obesity  Estimated Nutritional Needs: Kcal: 1850-2050 kcal Protein: 100-120 g protein Fluid: 2 L  Skin: Stage II PU on rectum  Diet Order: Diet heart healthy/carb modified   Intake/Output Summary (Last 24 hours) at 08/10/14 1005 Last data filed at 08/10/14 0700  Gross per 24 hour  Intake 1165.83 ml  Output    750 ml  Net 415.83 ml    Last BM: 3/16  Labs:   Recent Labs Lab 08/05/14 1215 08/06/14 0235 08/07/14 0328 08/09/14 0422 08/10/14 0514  NA 140 141 140 144 144  K 3.6 3.3* 3.9 2.8* 3.7  CL 90* 93* 95* 99 103  CO2 37* 35* 35* 35* 35*  BUN 28* 28* 38* 34* 24*  CREATININE 2.46* 2.62* 2.89* 1.96* 1.54*  CALCIUM 8.9 8.8 8.6 9.0 9.0  MG 2.0 2.0   --   --   --   PHOS 1.4* 2.6  --   --   --   GLUCOSE 139* 127* 156* 185* 166*    CBG (last 3)   Recent Labs  08/09/14 2315 08/10/14 0352 08/10/14 0819  GLUCAP 135* 149* 133*    Scheduled Meds: . amiodarone  200 mg Oral Daily  . enoxaparin (LOVENOX) injection  40 mg Subcutaneous Q24H  . ipratropium-albuterol  3 mL Nebulization Q6H  . [START ON 08/11/2014] levothyroxine  75 mcg Oral QAC breakfast  . potassium chloride  40 mEq Oral Once  . sodium chloride  10-40 mL Intracatheter Q12H    Continuous Infusions: . sodium chloride Stopped (08/08/14 2003)    Past Medical History  Diagnosis Date  . Anemia   . Edema   . OSA (obstructive sleep apnea)   . Intention tremor   . Lymphedema   . Atrial fibrillation   . Chronic respiratory failure   . Diabetes   . DDD (degenerative disc disease)   . Respiratory failure   . PNA (pneumonia)   . Congestive heart failure   . Insomnia   . Adenomatous colon polyp   . MRSA (methicillin resistant staph aureus) culture positive   . Hypertension   . Proteinuria   . Hyperlipidemia   . Restless  legs   . Idiopathic peripheral neuropathy   . Urinary tract infection     Past Surgical History  Procedure Laterality Date  . Rotator cuff repair    . Cholecystectomy      Elmer Picker MS Dietetic Intern Pager Number [phone]

## 2014-08-11 LAB — CULTURE, BLOOD (ROUTINE X 2)
CULTURE: NO GROWTH
Culture: NO GROWTH

## 2014-08-11 LAB — GLUCOSE, CAPILLARY
GLUCOSE-CAPILLARY: 200 mg/dL — AB (ref 70–99)
Glucose-Capillary: 153 mg/dL — ABNORMAL HIGH (ref 70–99)
Glucose-Capillary: 151 mg/dL — ABNORMAL HIGH (ref 70–99)
Glucose-Capillary: 162 mg/dL — ABNORMAL HIGH (ref 70–99)

## 2014-08-11 MED ORDER — FUROSEMIDE 80 MG PO TABS
80.0000 mg | ORAL_TABLET | Freq: Two times a day (BID) | ORAL | Status: DC
Start: 2014-08-11 — End: 2014-08-12
  Administered 2014-08-11 – 2014-08-12 (×2): 80 mg via ORAL
  Filled 2014-08-11 (×2): qty 1

## 2014-08-11 MED ORDER — ASPIRIN 325 MG PO TABS
325.0000 mg | ORAL_TABLET | Freq: Every day | ORAL | Status: DC
  Administered 2014-08-11 – 2014-08-12 (×2): 325 mg via ORAL
  Filled 2014-08-11 (×2): qty 1

## 2014-08-11 MED ORDER — ATORVASTATIN CALCIUM 20 MG PO TABS: 20.0000 mg | ORAL_TABLET | Freq: Every day | ORAL | Status: DC

## 2014-08-11 MED ORDER — FERROUS SULFATE 325 (65 FE) MG PO TABS
325.0000 mg | ORAL_TABLET | Freq: Every day | ORAL | Status: DC
  Administered 2014-08-12: 325 mg via ORAL
  Filled 2014-08-11: qty 1

## 2014-08-11 MED ORDER — ISOSORBIDE MONONITRATE ER 30 MG PO TB24
30.0000 mg | ORAL_TABLET | Freq: Every day | ORAL | Status: DC
  Administered 2014-08-11 – 2014-08-12 (×2): 30 mg via ORAL
  Filled 2014-08-11 (×2): qty 1

## 2014-08-11 MED ORDER — ROPINIROLE HCL 1 MG PO TABS
4.0000 mg | ORAL_TABLET | Freq: Every day | ORAL | Status: DC
  Administered 2014-08-11: 4 mg via ORAL
  Filled 2014-08-11: qty 4

## 2014-08-11 MED ORDER — SIMVASTATIN 40 MG PO TABS
40.0000 mg | ORAL_TABLET | Freq: Every day | ORAL | Status: DC
  Administered 2014-08-11: 40 mg via ORAL
  Filled 2014-08-11: qty 1

## 2014-08-11 MED ORDER — CARBIDOPA-LEVODOPA 25-250 MG PO TABS
1.0000 | ORAL_TABLET | Freq: Three times a day (TID) | ORAL | Status: DC
  Administered 2014-08-11 – 2014-08-12 (×4): 1 via ORAL
  Filled 2014-08-11 (×5): qty 1

## 2014-08-11 MED ORDER — FUROSEMIDE 10 MG/ML IJ SOLN
20.0000 mg | Freq: Once | INTRAMUSCULAR | Status: AC
  Administered 2014-08-11: 20 mg via INTRAVENOUS
  Filled 2014-08-11: qty 2

## 2014-08-11 NOTE — H&P (Signed)
Physical Medicine and Rehabilitation Admission H&P    Chief Complaint :Weakness  HPI: Meagan Campbell is a 74 y.o. right handed female with history of severe OHS/BiPAP dependent, COPD on oxygen for many years, chronic kidney disease with baseline creatinine 7.62, CHF, diastolic congestive heart failure, hypertension with atrial fibrillation. Independent prior to admission using a walker living with her husband. Presented presented to [facility] 07/19/2014 with increased weight gain and shortness of breath. Treated with aggressive diuresis as well as findings of urinary tract infection placed on broad-spectrum antibiotics. Developed increased shortness of breath 07/27/2014 with respiratory arrest and was intubated. Extubated 08/04/2014 but required reintubation and transferred to [facility] 08/05/2014 for ongoing care per critical care medicine. Hospital course MRSA PCR screening positive. Patient was extubated 08/09/2014 with careful O2 titration. Subcutaneous Lovenox for DVT prophylaxis. Remains on amiodarone for history of atrial fibrillation. Echocardiogram remains pending. Tolerating a regular diet. Physical therapy evaluation completed 08/10/2014. Patient noted still to be limited by endurance fatigue factors. Recommendations made for physical medicine rehabilitation consult.Patient was admitted for a comprehensive rehabilitation program  ROS Review of Systems  Respiratory: Positive for shortness of breath.  Cardiovascular: Positive for palpitations and leg swelling.  Psychiatric/Behavioral: The patient has insomnia Neurological. Restless leg syndrome.  All other systems reviewed and are negative  Past Medical History  Diagnosis Date  . Anemia   . Edema   . Intention tremor   . Lymphedema   . Atrial fibrillation   . Chronic respiratory failure   . Respiratory failure   . Congestive heart failure   . Insomnia   . Adenomatous colon polyp   . MRSA (methicillin  resistant staph aureus) culture positive   . Hypertension   . Proteinuria   . Hyperlipidemia   . Restless legs   . Idiopathic peripheral neuropathy   . Urinary tract infection   . COPD (chronic obstructive pulmonary disease)   . On home oxygen therapy     "2L at night" (08/10/2014)  . PNA (pneumonia) 05/2013; 05/2014  . OSA on CPAP   . Type II diabetes mellitus   . DDD (degenerative disc disease)   . Arthritis     "joints" (08/10/2014   Past Surgical History  Procedure Laterality Date  . Shoulder arthroscopy w/ rotator cuff repair Left   . Abdominal hysterectomy    . Laparoscopic cholecystectomy    . Knee arthroscopy Right   . Cataract extraction w/ intraocular lens  implant, bilateral     Family History  Problem Relation Age of Onset  . Diabetes Sister   . Hypertension Sister   . Cirrhosis Father   . Cancer - Colon Mother   . Cancer Brother     rectal cancer   Social History:  reports that she has never smoked. She has never used smokeless tobacco. She reports that she does not drink alcohol or use illicit drugs. Allergies:  Allergies  Allergen Reactions  . Codeine     nausea  . Darvon [Propoxyphene]     nausea   Medications Prior to Admission  Medication Sig Dispense Refill  . ACCU-CHEK AVIVA PLUS test strip Use as directed    . amiodarone (PACERONE) 200 MG tablet Take 200 mg by mouth daily.    Marland Kitchen aspirin 325 MG tablet Take 325 mg by mouth daily.    . Blood Glucose Calibration (ACCU-CHEK AVIVA) SOLN Use as directed    . Blood Glucose Monitoring Suppl (ACCU-CHEK AVIVA PLUS) W/DEVICE KIT Use as  directed    . carbidopa-levodopa (SINEMET IR) 25-250 MG per tablet Take 1 tablet by mouth 3 (three) times daily.    . carvedilol (COREG) 6.25 MG tablet Take 6.25 mg by mouth 2 (two) times daily.    . ferrous sulfate 325 (65 FE) MG tablet Take 325 mg by mouth daily with breakfast.    . fosinopril (MONOPRIL) 40 MG tablet Take 40 mg by mouth daily.    . furosemide (LASIX) 40 MG  tablet Take 80 mg by mouth 2 (two) times daily.  0  . furosemide (LASIX) 80 MG tablet Take 120 mg by mouth 2 (two) times daily.    Marland Kitchen gabapentin (NEURONTIN) 300 MG capsule Take 300 mg by mouth daily.     . hydrALAZINE (APRESOLINE) 50 MG tablet Take 50 mg by mouth 3 (three) times daily.    . insulin aspart protamine- aspart (NOVOLOG MIX 70/30) (70-30) 100 UNIT/ML injection Inject 60 Units into the skin 2 (two) times daily with a meal.     . ipratropium-albuterol (DUONEB) 0.5-2.5 (3) MG/3ML SOLN Take 3 mLs by nebulization 2 (two) times daily.     . isosorbide mononitrate (IMDUR) 30 MG 24 hr tablet Take 30 mg by mouth daily.    Marland Kitchen levothyroxine (SYNTHROID, LEVOTHROID) 50 MCG tablet Take 50 mcg by mouth daily.  0  . magnesium oxide (MAG-OX) 400 MG tablet Take 400 mg by mouth 2 (two) times daily.    . Magnesium Oxide 400 (240 MG) MG TABS Take 1 tablet by mouth 2 (two) times daily.  3  . Melatonin 5 MG TABS Take 1 tablet by mouth at bedtime.     Marland Kitchen nystatin (MYCOSTATIN/NYSTOP) 100000 UNIT/GM POWD Apply topically as needed.   2  . Omega-3 Fatty Acids (FISH OIL CONCENTRATE PO) Take 2 capsules by mouth 2 (two) times daily.     . ondansetron (ZOFRAN) 4 MG tablet Take 1 tablet by mouth every 4 (four) hours as needed for nausea or vomiting.     Marland Kitchen rOPINIRole (REQUIP) 4 MG tablet Take 4 mg by mouth at bedtime. 2 tabs at bed time    . simvastatin (ZOCOR) 40 MG tablet Take 40 mg by mouth daily.    Marland Kitchen ULTILET CLASSIC LANCETS MISC by Does not apply route.      Home: Home Living Family/patient expects to be discharged to:: Private residence Living Arrangements: Spouse/significant other Available Help at Discharge: Family, Available 24 hours/day Type of Home: House Home Access: [place]: One [place]: Environmental consultant - 2 wheels, Environmental consultant - 4 wheels, Wheelchair - manual, Bedside commode, Shower seat   Functional History: Prior Function Level of Independence: Needs assistance Gait /  Transfers Assistance Needed: Ambulates with RW in house, rollator outside. ADL's / Homemaking Assistance Needed: Husband would assist with bath.  Functional Status:  Mobility: Bed Mobility Overal bed mobility: Needs Assistance Bed Mobility: Supine to Sit Supine to sit: Min assist, HOB elevated General bed mobility comments: min assist for support through hand to allow pt to pull self into seated position. Able to scoot to edge of bed without assist. Requires extra time. Cues to breath frequently. Transfers Overall transfer level: Needs assistance Equipment used: Rolling walker (2 wheeled) Transfers: Sit to/from Stand, Stand Pivot Transfers Sit to Stand: Min assist, +2 physical assistance, From elevated surface Stand pivot transfers: Mod assist, +2 physical assistance General transfer comment: Min assist +2 for boost to stand and stability for posterior lean initially. Pivoting well until very end  required Mod assist to control descent into chair as pt began to rush. Stating she felt lightheaded.      ADL:    Mobility Bed Mobility Overal bed mobility: Needs Assistance Bed Mobility: Supine to Sit     Supine to sit: Mod assist;HOB elevated     General bed mobility comments: Mod A to follow through with trunk elevation to sit EOB. Cues for use of rails. HOB elevated. increased effort  Transfers Overall transfer level: Needs assistance Equipment used: Rolling walker (2 wheeled) Transfers: Sit to/from [facility] Sit to Stand: Max assist;+2 physical assistance;+2 safety/equipment Stand pivot transfers: Mod assist;+2 physical assistance       General transfer comment: Utilized stedy this session for sit to stand as patient unable to stand with use of RW. +2 max A to promote anterior translation and power up into full stand. Once weight shift up and over knee patient and to stand fully erect with +2 min A. Patient stood x4 with use of stedy to promote increased  activity tolerance and repeatition.     Balance Overall balance assessment: Needs assistance Sitting-balance support: No upper extremity supported;Feet supported Sitting balance-Leahy Scale: Fair     Standing balance support: Bilateral upper extremity supported;During functional activity Standing balance-Leahy Scale: Poor         Cognition: Cognition Overall Cognitive Status: Within Functional Limits for tasks assessed Orientation Level: Oriented X4 Cognition Arousal/Alertness: Awake/alert Behavior During Therapy: WFL for tasks assessed/performed Overall Cognitive Status: Within Functional Limits for tasks assessed  Physical Exam: Blood pressure 141/39, pulse 77, temperature 98.4 F (36.9 C), temperature source Oral, resp. rate 20, height _0  (1.676 m), weight 127.007 kg (280 lb), SpO2 95 %. Physical Exam Constitutional: She is oriented to person, place, and time.  74 year old obese female in no distress HENT: oral mucosa moist, pink Head: Normocephalic.  Eyes: EOM are normal.  Neck: Normal range of motion. Neck supple. No thyromegaly present.  Cardiovascular:  Cardiac rate controlled without murmur Respiratory: no dyspnea, no wheezes or rales Lungs decreased breath sounds at the bases with good inspiratory effort--otherwise clear GI: Soft. Bowel sounds are normal. She exhibits no distension.  Musculoskeletal:  +1 edema lower extremities.  Neurological: She is alert and oriented to person, place, and time. A cranial nerve deficit is present. She exhibits normal muscle tone. Mild essential tremor MMT-- Right: 3+/5 deltoid, 4-/5 bicep, 4-/5 tricep, 4/5 wrist extension, 4+/5 hand intrinsics. 1+/5 hip flexor, 2/5 knee extension, 3/5 ankle dorsiflexion, 3/5 ankle plantarflexion. Left: 3+/5 deltoid, 4-/5 bicep, 4-/5 tricep, 4/5 wrist extension, 4+/5 hand intrinsics. 1+/5 hip flexor, 2/5 knee extension, 3/5 ankle dorsiflexion, 3/5 ankle plantarflexion. No sensory  deficits Skin: Skin is warm and dry. Patient sitting up and share with [place] buttocks was not examined  Psychiatric: generally pleasant and cooperative. Appears a little anxious   Results for orders placed or performed during the hospital encounter of 08/05/14 (from the past 48 hour(s))  Glucose, capillary     Status: Abnormal   Collection Time: 08/09/14 11:36 AM  Result Value Ref Range   Glucose-Capillary 173 (H) 70 - 99 mg/dL  Glucose, capillary     Status: Abnormal   Collection Time: 08/09/14  3:55 PM  Result Value Ref Range   Glucose-Capillary 160 (H) 70 - 99 mg/dL  Glucose, capillary     Status: Abnormal   Collection Time: 08/09/14  7:14 PM  Result Value Ref Range   Glucose-Capillary 118 (H) 70 - 99 mg/dL  Glucose,  capillary     Status: Abnormal   Collection Time: 08/09/14 11:15 PM  Result Value Ref Range   Glucose-Capillary 135 (H) 70 - 99 mg/dL   Comment 1 Notify RN   Glucose, capillary     Status: Abnormal   Collection Time: 08/10/14  3:52 AM  Result Value Ref Range   Glucose-Capillary 149 (H) 70 - 99 mg/dL   Comment 1 Notify RN   Basic metabolic panel     Status: Abnormal   Collection Time: 08/10/14  5:14 AM  Result Value Ref Range   Sodium 144 135 - 145 mmol/L   Potassium 3.7 3.5 - 5.1 mmol/L    Comment: DELTA CHECK NOTED   Chloride 103 96 - 112 mmol/L   CO2 35 (H) 19 - 32 mmol/L   Glucose, Bld 166 (H) 70 - 99 mg/dL   BUN 24 (H) 6 - 23 mg/dL   Creatinine, Ser 1.54 (H) 0.50 - 1.10 mg/dL   Calcium 9.0 8.4 - 10.5 mg/dL   GFR calc non Af Amer 32 (L) >90 mL/min   GFR calc Af Amer 37 (L) >90 mL/min    Comment: (NOTE) The eGFR has been calculated using the CKD EPI equation. This calculation has not been validated in all clinical situations. eGFR's persistently <90 mL/min signify possible Chronic Kidney Disease.    Anion gap 6 5 - 15  CBC     Status: Abnormal   Collection Time: 08/10/14  5:14 AM  Result Value Ref Range   WBC 6.8 4.0 - 10.5 K/uL   RBC 3.61 (L) 3.87  - 5.11 MIL/uL   Hemoglobin 9.5 (L) 12.0 - 15.0 g/dL   HCT 33.3 (L) 36.0 - 46.0 %   MCV 92.2 78.0 - 100.0 fL   MCH 26.3 26.0 - 34.0 pg   MCHC 28.5 (L) 30.0 - 36.0 g/dL   RDW 19.2 (H) 11.5 - 15.5 %   Platelets 177 150 - 400 K/uL    Comment: SPECIMEN CHECKED FOR CLOTS REPEATED TO VERIFY PLATELET COUNT CONFIRMED BY SMEAR   Glucose, capillary     Status: Abnormal   Collection Time: 08/10/14  8:19 AM  Result Value Ref Range   Glucose-Capillary 133 (H) 70 - 99 mg/dL  Glucose, capillary     Status: Abnormal   Collection Time: 08/10/14  5:13 PM  Result Value Ref Range   Glucose-Capillary 212 (H) 70 - 99 mg/dL  Glucose, capillary     Status: Abnormal   Collection Time: 08/10/14  9:11 PM  Result Value Ref Range   Glucose-Capillary 164 (H) 70 - 99 mg/dL  Glucose, capillary     Status: Abnormal   Collection Time: 08/11/14  7:40 AM  Result Value Ref Range   Glucose-Capillary 153 (H) 70 - 99 mg/dL   Dg Chest Port 1 View  08/10/2014   CLINICAL DATA:  Respiratory failure  EXAM: PORTABLE CHEST - 1 VIEW  COMPARISON:  08/09/2014; 08/07/2014; 08/06/2014  FINDINGS: Grossly unchanged enlarged cardiac silhouette and mediastinal contour given persistently reduced lung volumes. Atherosclerotic plaque within the thoracic aorta is. Interval extubation and removal of enteric tube. Otherwise, stable positioning of remaining support apparatus. No pneumothorax. Pulmonary vasculature remains indistinct with cephalization of flow. Heterogeneous opacities within the right mid and left lower lung are unchanged. No definite pleural effusion. Unchanged bones including old left-sided rib fractures.  IMPRESSION: 1. Interval extubation and removal of enteric tube. Otherwise, stable positioning of remaining support apparatus. No pneumothorax. 2. Similar findings of pulmonary edema  and bibasilar opacities, left greater than right, atelectasis versus infiltrate.   Electronically Signed   By: Sandi Mariscal M.D.   On: 08/10/2014  07:11       Medical Problem List and Plan: 1. 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 2.  DVT Prophylaxis/Anticoagulation: Lovenox.Monitor for any bleeding episodes 3. Pain Management: Tylenol as needed 4. COPD/OSA. continue nebulizers. Titrate oxygen. Followed by Dr. Joya Gaskins 5. Neuropsych: This patient is capable of making decisions on her own behalf. 6. Skin/Wound Care: Routine skin checks/need to check for any pressure ulcers 7. Fluids/Electrolytes/Nutrition: Strict I&O.Follow up labs 8. Chronic renal insufficiency. Baseline creatinine 2.62. Follow-up chemistries 9. Diastolic congestive heart failure. Lasix 80 mg twice a day. Monitor for any signs of fluid overload. 10. Hypertension/atrial fibrillation. Amiodarone 200 mg daily, Imdur 30 mg daily. Coreg recently held. Cardiac rate control. Monitor with increased mobility. Echocardiogram remains pending from 08/11/2014 11. Hypothyroidism. Synthroid 12. Diabetes mellitus with peripheral neuropathy. Hemoglobin A1c 6.3. Check blood sugars before meals and at bedtime. Sliding scale insulin. Patient on 70/30 insulin 60 units twice a day prior to admission. Resume as tolerated 13. MRSA PCR screening positive. Contact precautions 14. Hyperlipidemia. Lipitor 15. RLS. Sinemet and Requip.   Post Admission Physician Evaluation: 1. Functional deficits secondary  to critical illness myopathy. 2. Patient is admitted to receive collaborative, interdisciplinary care between the physiatrist, rehab nursing staff, and therapy team. 3. Patient's level of medical complexity and substantial therapy needs in context of that medical necessity cannot be provided at a lesser intensity of care such as a SNF. 4. Patient has experienced substantial functional loss from his/her baseline which was documented above under the "Functional History" and "Functional Status" headings.  Judging by the patient's diagnosis, physical  exam, and functional history, the patient has potential for functional progress which will result in measurable gains while on inpatient rehab.  These gains will be of substantial and practical use upon discharge  in facilitating mobility and self-care at the household level. 5. Physiatrist will provide 24 hour management of medical needs as well as oversight of the therapy plan/treatment and provide guidance as appropriate regarding the interaction of the two. 6. 24 hour rehab nursing will assist with bladder management, bowel management, safety, skin/wound care, disease management, medication administration, pain management and patient education  and help integrate therapy concepts, techniques,education, etc. 7. PT will assess and treat for/with: Lower extremity strength, range of motion, stamina, balance, functional mobility, safety, adaptive techniques and equipment, NMR, pain mgt, activity tolerance.   Goals are: supervision to min assist. 8. OT will assess and treat for/with: ADL's, functional mobility, safety, upper extremity strength, adaptive techniques and equipment, NMR, pain mgt, activity tolerance, ego support, community reintegration.   Goals are: supervision to min assist+. Therapy may proceed with showering this patient. 9. SLP will assess and treat for/with: n/a.  Goals are: n/a. 10. Case Management and Social Worker will assess and treat for psychological issues and discharge planning. 11. Team conference will be held weekly to assess progress toward goals and to determine barriers to discharge. 12. Patient will receive at least 3 hours of therapy per day at least 5 days per week. 13. ELOS: 15-20 days       14. Prognosis:  excellent     Meredith Staggers, MD, [place] [facility] 08/12/2014   08/11/2014

## 2014-08-11 NOTE — Progress Notes (Signed)
Rehab admissions - I met with pt and her husband in follow up to rehab MD consult to explain the possibility of inpatient rehab. Further questions were answered and informational brochures were given. Pt is interested in pursuing inpatient rehab.  I explained that pt has [facility] Medicare and we will need insurance authorization to consider possible admission. I also explained that pt will have a copay to come to CIR. Pt/husband would like to know these amounts to make final decision and I will now open her case with insurance. I will provide pt/husband the copay information as well.  I discussed pt's case with Nira Conn, case Freight forwarder and Randall Hiss, Education officer, museum.   Please call me with any questions. Thanks.  Nanetta Batty, PT Rehabilitation Admissions Coordinator [phone]

## 2014-08-11 NOTE — Clinical Social Work Placement (Signed)
Clinical Social Work Department CLINICAL SOCIAL WORK PLACEMENT NOTE 08/11/2014  Patient:  Meagan Campbell, Meagan Campbell  Account Number:  [id number] [place] date:  08/05/2014  Clinical Social Worker:  Madalyne Husk, LCSWA  Date/time:  08/11/2014 05:42 PM  Clinical Social Work is seeking post-discharge placement for this patient at the following level of care:   SKILLED NURSING   (*CSW will update this form in Epic as items are completed)   08/11/2014  Patient/family provided with [place] Department of Clinical Social Work's list of facilities offering this level of care within the geographic area requested by the patient (or if unable, by the patient's family).  08/11/2014  Patient/family informed of their freedom to choose among providers that offer the needed level of care, that participate in Medicare, Medicaid or managed care program needed by the patient, have an available bed and are willing to accept the patient.  08/11/2014  Patient/family informed of [facility]' ownership interest in [facility], as well as of the fact that they are under no obligation to receive care at this facility.  PASARR submitted to EDS on 08/11/2014 PASARR number received on 08/11/2014  FL2 transmitted to all facilities in geographic area requested by pt/family on  08/11/2014 FL2 transmitted to all facilities within larger geographic area on 08/11/2014  Patient informed that his/her managed care company has contracts with or will negotiate with  certain facilities, including the following:     Patient/family informed of bed offers received:   Patient chooses bed at  Physician recommends and patient chooses bed at    Patient to be transferred to  on   Patient to be transferred to facility by  Patient and family notified of transfer on  Name of family member notified:    The following physician request were entered in Epic: Physician Request  Please sign FL2.    Additional Comments:  Jones Broom. [place], MSW, [place] 08/11/2014 5:44 PM

## 2014-08-11 NOTE — Clinical Social Work Psychosocial (Signed)
Clinical Social Work Department BRIEF PSYCHOSOCIAL ASSESSMENT 08/11/2014  Patient:  Meagan Campbell,Meagan Campbell     Account Number:  [id number]     Admit date:  08/05/2014  Clinical Social Worker:  Dian Queen  Date/Time:  08/11/2014 05:26 PM  Referred by:  Physician  Date Referred:  08/11/2014 Referred for  SNF Placement   Other Referral:   Interview type:  Patient Other interview type:   family    PSYCHOSOCIAL DATA Living Status:   Admitted from facility:   Level of care:   Primary support name:  Sumayya Muha Primary support relationship to patient:  SPOUSE Degree of support available:   Patient lives with her husband, and has good support    CURRENT CONCERNS Current Concerns  Post-Acute Placement   Other Concerns:    SOCIAL WORK ASSESSMENT / PLAN Patient is a 74 year old female who lives with her husband. Patient is alert and oriented x4, and pleasant to talk to. Patient stated she has been at a SNF before for short term rehab, and is aware of the process.  Patient and her husband expressed they would like to go to inpatient rehab if insurance approves, however they were explained that if insurance does not approve inpatient rehab, patient would have to go to SNF.  Patient expressed understanding of the situation and is in agreement to going to SNF for short term rehab.   Assessment/plan status:  Psychosocial Support/Ongoing Assessment of Needs Other assessment/ plan:   Information/referral to community resources:    PATIENT'S/FAMILY'S RESPONSE TO PLAN OF CARE: Patient and family are in agreement to going to SNF for short term rehab if they are unable to go to inpatient rehab.    Jones Broom. [place], MSW, [place] 08/11/2014 5:40 PM

## 2014-08-11 NOTE — Clinical Social Work Note (Signed)
CSW met with patient and her husband to discuss SNF placement.  Patient and husband would prefer inpatient rehab at hospital, but understand their insurance may not approve, and are willing to go to SNF for short term rehab.  Patient has been faxed out, and FL2 has been completed, FL2 will be on the chart for physician signature.  Jones Broom. [place], MSW, [place] 08/11/2014 2:49 PM

## 2014-08-11 NOTE — Progress Notes (Signed)
PULMONARY / CRITICAL CARE MEDICINE   Name: Meagan Campbell MRN: [id number] DOB: 01/11/1941    ADMISSION DATE:  08/05/2014   REFERRING MD :  L[place]comb  CHIEF COMPLAINT:  FTT  INITIAL PRESENTATION:  81 F with OHS, O2 dependence, CKD, chronic severe edema admitted to [facility] 2/23 with worsening edema. Treated with diuresis and empiric abx. Intubated 3/02. Failed extubation 3/10. Transferred to [facility] 3/11   has a past medical history of Anemia; Edema; Intention tremor; Lymphedema; Atrial fibrillation; Chronic respiratory failure; Respiratory failure; Congestive heart failure; Insomnia; Adenomatous colon polyp; MRSA (methicillin resistant staph aureus) culture positive; Hypertension; Proteinuria; Hyperlipidemia; Restless legs; Idiopathic peripheral neuropathy; Urinary tract infection; COPD (chronic obstructive pulmonary disease); On home oxygen therapy; PNA (pneumonia) (05/2013; 05/2014); OSA on CPAP; Type II diabetes mellitus; DDD (degenerative disc disease); and Arthritis.   has past surgical history that includes Shoulder arthroscopy w/ rotator cuff repair (Left); Abdominal hysterectomy; Laparoscopic cholecystectomy; Knee arthroscopy (Right); and Cataract extraction w/ intraocular lens  implant, bilateral.   reports that she has never smoked. She has never used smokeless tobacco.    INDWELLING DEVICES:: ETT 3/02 >> 3/08, 3/10 >> 3/15  MICRO DATA: MRSA PCR 3/11 >> POS Blood 3/11 >> NEG Urine 3/11 >> NEG Resp 3/12 >> NEG  ANTIMICROBIALS:  Vanc 3/11 >> 3/14 Pip-tazo 3/12 >> 3/14  EVENTS 08/10/14: Has tolerated extubation well. No new complaints. No distress   SUBJECTIVE/OVERNIGHT/INTERVAL HX 08/11/14: Improving creat. bEtter overall she sayus but c/o pedal edema. Awaiting insurance clearance for CIR. Still deconditioned. Sitting in chair. Needing max assist for transfers   VITAL SIGNS: Temp:  [98.4 F (36.9 C)-98.6 F (37 C)] 98.4 F (36.9 C) (03/17 0620) Pulse Rate:   [72-78] 77 (03/17 0620) Resp:  [20] 20 (03/17 0620) BP: (138-141)/(30-39) 141/39 mmHg (03/17 0620) SpO2:  [95 %-100 %] 95 % (03/17 0620) Weight:  [127.007 kg (280 lb)] 127.007 kg (280 lb) (03/17 0500) HEMODYNAMICS:   VENTILATOR SETTINGS:   INTAKE / OUTPUT:  Intake/Output Summary (Last 24 hours) at 08/11/14 1359 Last data filed at 08/11/14 0910  Gross per 24 hour  Intake    360 ml  Output      0 ml  Net    360 ml    PHYSICAL EXAMINATION: General:  NAD, obese Neuro:  MAEs HEENT: WNL Cardiovascular: Reg, no M Lungs:  Clear anteriorly Abdomen:  Obese, soft, + bs Ext: 2+ symmetric edema  LABS:   PULMONARY  Recent Labs Lab 08/05/14 1038 08/06/14 0414  PHART 7.594* 7.502*  PCO2ART 39.2 48.9*  PO2ART 61.0* 54.0*  HCO3 37.9* 38.2*  TCO2 39 40  O2SAT 94.0 90.0    CBC  Recent Labs Lab 08/07/14 0328 08/09/14 0422 08/10/14 0514  HGB 9.7* 9.4* 9.5*  HCT 32.7* 32.3* 33.3*  WBC 7.8 7.2 6.8  PLT 173 127* 177    COAGULATION  Recent Labs Lab 08/05/14 1215 08/09/14 0422  INR 1.26 1.11    CARDIAC  No results for input(s): TROPONINI in the last 168 hours. No results for input(s): PROBNP in the last 168 hours.   CHEMISTRY  Recent Labs Lab 08/05/14 1215 08/06/14 0235 08/07/14 0328 08/09/14 0422 08/10/14 0514  NA 140 141 140 144 144  K 3.6 3.3* 3.9 2.8* 3.7  CL 90* 93* 95* 99 103  CO2 37* 35* 35* 35* 35*  GLUCOSE 139* 127* 156* 185* 166*  BUN 28* 28* 38* 34* 24*  CREATININE 2.46* 2.62* 2.89* 1.96* 1.54*  CALCIUM 8.9 8.8 8.6 9.0  9.0  MG 2.0 2.0  --   --   --   PHOS 1.4* 2.6  --   --   --    Estimated Creatinine Clearance: 43.1 mL/min (by C-G formula based on Cr of 1.54).   LIVER  Recent Labs Lab 08/05/14 1215 08/09/14 0422  AST 26  --   ALT 14  --   ALKPHOS 94  --   BILITOT 1.2  --   PROT 6.1  --   ALBUMIN 2.6*  --   INR 1.26 1.11     INFECTIOUS  Recent Labs Lab 08/05/14 1215  LATICACIDVEN 2.1*  PROCALCITON 0.18      ENDOCRINE CBG (last 3)   Recent Labs  08/10/14 2111 08/11/14 0740 08/11/14 1235  GLUCAP 164* 153* 200*         IMAGING x48h Dg Chest Port 1 View  08/10/2014   CLINICAL DATA:  Respiratory failure  EXAM: PORTABLE CHEST - 1 VIEW  COMPARISON:  08/09/2014; 08/07/2014; 08/06/2014  FINDINGS: Grossly unchanged enlarged cardiac silhouette and mediastinal contour given persistently reduced lung volumes. Atherosclerotic plaque within the thoracic aorta is. Interval extubation and removal of enteric tube. Otherwise, stable positioning of remaining support apparatus. No pneumothorax. Pulmonary vasculature remains indistinct with cephalization of flow. Heterogeneous opacities within the right mid and left lower lung are unchanged. No definite pleural effusion. Unchanged bones including old left-sided rib fractures.  IMPRESSION: 1. Interval extubation and removal of enteric tube. Otherwise, stable positioning of remaining support apparatus. No pneumothorax. 2. Similar findings of pulmonary edema and bibasilar opacities, left greater than right, atelectasis versus infiltrate.   Electronically Signed   By: Sandi Mariscal M.D.   On: 08/10/2014 07:11        ASSESSMENT / PLAN:  PULMONARY A: Acute on chronic hypercarbic resp failure Severe OSA/OHS P:  Careful O2 titration SpO2 goal 88-93% Cont nebulized BDs Add nebulized steroids 3/16 Nocturnal bipap Needs opd sleep doc at dc  CARDIOVASCULAR A:  CAD, quiescent and BP  - on asa, zocor, coreg, hydralazine, imdur, lasix amio  at home PAF > NSR presently - nil acute  P:  Holding carvedilol Cont amiodarone Restart home asa, imdur, lasix Check echo  Continue to hold coreg, hydralazien for now; restart gently but after echo results review  RENAL A:   CKD AKI - improving Hypokalemia, recurrent Anasarca, improving - autodiuresing   - having 3+ edema P:   Monitor BMET intermittently Monitor I/Os Correct electrolytes as  indicated Restart home lasix  GASTROINTESTINAL A:   Obesity P:   SUP: N/I post ext Begin diet 3/16  HEMATOLOGIC A:   Chronic anemia without overt blood loss P:  DVT px: SQ LMWH Monitor CBC intermittently Transfuse per usual ICU guidelines  INFECTIOUS A:   UTI, treated No apparent active infection P:   Micro and abx as above  ENDOCRINE A:  DM 2 Hypothyroidism, TSH elevated 3/12 P:   Cont SSI Cont L thyroxine - increased 3/12 Recheck TSH 3/19  NEUROLOGIC A:   #Baseline  Chronic pain - on gabapentin 300mg  daily ? Due to DM neuropathy   ? Parkinson - on sinemet at home but she has no idea why on 08/11/2014  Restless legs - on requip at home - she confirms this on 08/11/2014  #At admit  - AMS, hypercarbic - resolved  #currently  - Deconditioning P:   RASS goal: 0 Hold home gabapentin ; reintroduce if needed Restart sinemet  Transfer to [facility] 3/16. Updated husband.  Waiting on insurance clearance for CIR. TRH primary from 08/12/14  and PCCM off    Dr. Brand Males, M.D., [facility].C.P Pulmonary and Critical Care Medicine Staff Physician [facility] Pager: [phone], If no answer or between  15:00h - 7:00h: call [phone]  08/11/2014 2:00 PM

## 2014-08-12 ENCOUNTER — Inpatient Hospital Stay (HOSPITAL_COMMUNITY)
Admission: RE | Admit: 2014-08-12 | Discharge: 2014-08-19 | DRG: 092 | Disposition: A | Discharge status: Home Health | Payer: Medicare Other | Source: Intra-hospital | Attending: Physical Medicine & Rehabilitation | Admitting: Physical Medicine & Rehabilitation

## 2014-08-12 DIAGNOSIS — I5032 Chronic diastolic (congestive) heart failure: Secondary | ICD-10-CM | POA: Diagnosis not present

## 2014-08-12 DIAGNOSIS — E785 Hyperlipidemia, unspecified: Secondary | ICD-10-CM | POA: Diagnosis not present

## 2014-08-12 DIAGNOSIS — E039 Hypothyroidism, unspecified: Secondary | ICD-10-CM | POA: Diagnosis present

## 2014-08-12 DIAGNOSIS — Z9981 Dependence on supplemental oxygen: Secondary | ICD-10-CM

## 2014-08-12 DIAGNOSIS — IMO0002 Reserved for concepts with insufficient information to code with codable children: Secondary | ICD-10-CM | POA: Diagnosis present

## 2014-08-12 DIAGNOSIS — F4322 Adjustment disorder with anxiety: Secondary | ICD-10-CM | POA: Diagnosis not present

## 2014-08-12 DIAGNOSIS — Z833 Family history of diabetes mellitus: Secondary | ICD-10-CM

## 2014-08-12 DIAGNOSIS — R531 Weakness: Secondary | ICD-10-CM | POA: Diagnosis present

## 2014-08-12 DIAGNOSIS — M1990 Unspecified osteoarthritis, unspecified site: Secondary | ICD-10-CM | POA: Diagnosis not present

## 2014-08-12 DIAGNOSIS — G2581 Restless legs syndrome: Secondary | ICD-10-CM | POA: Diagnosis present

## 2014-08-12 DIAGNOSIS — F419 Anxiety disorder, unspecified: Secondary | ICD-10-CM | POA: Diagnosis not present

## 2014-08-12 DIAGNOSIS — E1129 Type 2 diabetes mellitus with other diabetic kidney complication: Secondary | ICD-10-CM

## 2014-08-12 DIAGNOSIS — G4733 Obstructive sleep apnea (adult) (pediatric): Secondary | ICD-10-CM

## 2014-08-12 DIAGNOSIS — G7281 Critical illness myopathy: Principal | ICD-10-CM

## 2014-08-12 DIAGNOSIS — N390 Urinary tract infection, site not specified: Secondary | ICD-10-CM | POA: Diagnosis present

## 2014-08-12 DIAGNOSIS — J449 Chronic obstructive pulmonary disease, unspecified: Secondary | ICD-10-CM | POA: Diagnosis not present

## 2014-08-12 DIAGNOSIS — I129 Hypertensive chronic kidney disease with stage 1 through stage 4 chronic kidney disease, or unspecified chronic kidney disease: Secondary | ICD-10-CM | POA: Diagnosis present

## 2014-08-12 DIAGNOSIS — I4891 Unspecified atrial fibrillation: Secondary | ICD-10-CM | POA: Diagnosis not present

## 2014-08-12 DIAGNOSIS — Z6841 Body Mass Index (BMI) 40.0 and over, adult: Secondary | ICD-10-CM

## 2014-08-12 DIAGNOSIS — N189 Chronic kidney disease, unspecified: Secondary | ICD-10-CM | POA: Diagnosis present

## 2014-08-12 DIAGNOSIS — E1165 Type 2 diabetes mellitus with hyperglycemia: Secondary | ICD-10-CM | POA: Diagnosis not present

## 2014-08-12 DIAGNOSIS — E1142 Type 2 diabetes mellitus with diabetic polyneuropathy: Secondary | ICD-10-CM | POA: Diagnosis not present

## 2014-08-12 DIAGNOSIS — J9621 Acute and chronic respiratory failure with hypoxia: Secondary | ICD-10-CM

## 2014-08-12 DIAGNOSIS — I8311 Varicose veins of right lower extremity with inflammation: Secondary | ICD-10-CM | POA: Diagnosis not present

## 2014-08-12 LAB — GLUCOSE, CAPILLARY
GLUCOSE-CAPILLARY: 176 mg/dL — AB (ref 70–99)
GLUCOSE-CAPILLARY: 135 mg/dL — AB (ref 70–99)
Glucose-Capillary: 201 mg/dL — ABNORMAL HIGH (ref 70–99)
Glucose-Capillary: 159 mg/dL — ABNORMAL HIGH (ref 70–99)

## 2014-08-12 LAB — BASIC METABOLIC PANEL
ANION GAP: 10 (ref 5–15)
BUN: 20 mg/dL (ref 6–23)
CALCIUM: 9.2 mg/dL (ref 8.4–10.5)
CO2: 32 mmol/L (ref 19–32)
Chloride: 102 mmol/L (ref 96–112)
Creatinine, Ser: 1.65 mg/dL — ABNORMAL HIGH (ref 0.50–1.10)
GFR, EST AFRICAN AMERICAN: 34 mL/min — AB (ref >90)
GFR, EST NON AFRICAN AMERICAN: 29 mL/min — AB (ref >90)
Glucose, Bld: 198 mg/dL — ABNORMAL HIGH (ref 70–99)
Potassium: 3.9 mmol/L (ref 3.5–5.1)
SODIUM: 144 mmol/L (ref 135–145)

## 2014-08-12 LAB — CBC
HCT: 34.8 % — ABNORMAL LOW (ref 36.0–46.0)
Hemoglobin: 10.3 g/dL — ABNORMAL LOW (ref 12.0–15.0)
MCH: 26.5 pg (ref 26.0–34.0)
MCHC: 29.6 g/dL — ABNORMAL LOW (ref 30.0–36.0)
MCV: 89.7 fL (ref 78.0–100.0)
Platelets: 212 10*3/uL (ref 150–400)
RBC: 3.88 MIL/uL (ref 3.87–5.11)
RDW: 18.7 % — ABNORMAL HIGH (ref 11.5–15.5)
WBC: 6.7 10*3/uL (ref 4.0–10.5)

## 2014-08-12 LAB — MAGNESIUM: MAGNESIUM: 2 mg/dL (ref 1.5–2.5)

## 2014-08-12 LAB — PHOSPHORUS: PHOSPHORUS: 2.6 mg/dL (ref 2.3–4.6)

## 2014-08-12 MED ORDER — CARBIDOPA-LEVODOPA 25-250 MG PO TABS
1.0000 | ORAL_TABLET | Freq: Three times a day (TID) | ORAL | Status: DC
  Administered 2014-08-12 – 2014-08-19 (×20): 1 via ORAL
  Filled 2014-08-12 (×29): qty 1

## 2014-08-12 MED ORDER — SODIUM CHLORIDE 0.9 % IJ SOLN
10.0000 mL | INTRAMUSCULAR | Status: DC | PRN
  Administered 2014-08-13: 10 mL
  Administered 2014-08-13 – 2014-08-15 (×3): 20 mL
  Administered 2014-08-16 (×2): 10 mL
  Administered 2014-08-17: 20 mL
  Administered 2014-08-18 (×3): 10 mL
  Filled 2014-08-12 (×10): qty 40

## 2014-08-12 MED ORDER — INSULIN ASPART 100 UNIT/ML ~~LOC~~ SOLN: 0.0000 [IU] | Freq: Every day | SUBCUTANEOUS | Status: DC

## 2014-08-12 MED ORDER — INSULIN ASPART 100 UNIT/ML ~~LOC~~ SOLN: 4.0000 [IU] | Freq: Three times a day (TID) | SUBCUTANEOUS | Status: DC

## 2014-08-12 MED ORDER — LEVOTHYROXINE SODIUM 75 MCG PO TABS
75.0000 ug | ORAL_TABLET | Freq: Every day | ORAL | Status: DC
  Administered 2014-08-13 – 2014-08-19 (×7): 75 ug via ORAL
  Filled 2014-08-12 (×8): qty 1

## 2014-08-12 MED ORDER — ONDANSETRON HCL 4 MG/2ML IJ SOLN: 4.0000 mg | Freq: Four times a day (QID) | INTRAMUSCULAR | Status: DC | PRN

## 2014-08-12 MED ORDER — AMIODARONE HCL 200 MG PO TABS
200.0000 mg | ORAL_TABLET | Freq: Every day | ORAL | Status: DC
  Administered 2014-08-13 – 2014-08-19 (×7): 200 mg via ORAL
  Filled 2014-08-12 (×8): qty 1

## 2014-08-12 MED ORDER — INSULIN ASPART 100 UNIT/ML ~~LOC~~ SOLN: 0.0000 [IU] | Freq: Three times a day (TID) | SUBCUTANEOUS | Status: DC

## 2014-08-12 MED ORDER — FUROSEMIDE 80 MG PO TABS
80.0000 mg | ORAL_TABLET | Freq: Two times a day (BID) | ORAL | Status: DC
  Administered 2014-08-12 – 2014-08-19 (×14): 80 mg via ORAL
  Filled 2014-08-12 (×16): qty 1

## 2014-08-12 MED ORDER — BUDESONIDE 0.25 MG/2ML IN SUSP
0.2500 mg | Freq: Four times a day (QID) | RESPIRATORY_TRACT | Status: DC
  Administered 2014-08-12 – 2014-08-19 (×18): 0.25 mg via RESPIRATORY_TRACT
  Filled 2014-08-12 (×31): qty 2

## 2014-08-12 MED ORDER — ROPINIROLE HCL 1 MG PO TABS
4.0000 mg | ORAL_TABLET | Freq: Every day | ORAL | Status: DC
  Administered 2014-08-12 – 2014-08-18 (×7): 4 mg via ORAL
  Filled 2014-08-12 (×8): qty 4

## 2014-08-12 MED ORDER — ONDANSETRON HCL 4 MG PO TABS: 4.0000 mg | ORAL_TABLET | Freq: Four times a day (QID) | ORAL | Status: DC | PRN

## 2014-08-12 MED ORDER — INSULIN ASPART 100 UNIT/ML ~~LOC~~ SOLN
4.0000 [IU] | Freq: Three times a day (TID) | SUBCUTANEOUS | Status: DC
  Administered 2014-08-12 – 2014-08-19 (×20): 4 [IU] via SUBCUTANEOUS

## 2014-08-12 MED ORDER — INSULIN ASPART 100 UNIT/ML ~~LOC~~ SOLN
0.0000 [IU] | Freq: Three times a day (TID) | SUBCUTANEOUS | Status: DC
  Administered 2014-08-12 – 2014-08-13 (×2): 3 [IU] via SUBCUTANEOUS
  Administered 2014-08-13: 2 [IU] via SUBCUTANEOUS
  Administered 2014-08-13: 3 [IU] via SUBCUTANEOUS
  Administered 2014-08-14: 5 [IU] via SUBCUTANEOUS
  Administered 2014-08-14 – 2014-08-15 (×3): 2 [IU] via SUBCUTANEOUS
  Administered 2014-08-15 – 2014-08-16 (×2): 3 [IU] via SUBCUTANEOUS
  Administered 2014-08-16: 2 [IU] via SUBCUTANEOUS
  Administered 2014-08-16 – 2014-08-17 (×2): 3 [IU] via SUBCUTANEOUS
  Administered 2014-08-17: 2 [IU] via SUBCUTANEOUS
  Administered 2014-08-18: 3 [IU] via SUBCUTANEOUS
  Administered 2014-08-18 (×2): 2 [IU] via SUBCUTANEOUS
  Administered 2014-08-19: 3 [IU] via SUBCUTANEOUS
  Administered 2014-08-19: 2 [IU] via SUBCUTANEOUS

## 2014-08-12 MED ORDER — ENOXAPARIN SODIUM 40 MG/0.4ML ~~LOC~~ SOLN: 40.0000 mg | SUBCUTANEOUS | Status: DC

## 2014-08-12 MED ORDER — IPRATROPIUM-ALBUTEROL 0.5-2.5 (3) MG/3ML IN SOLN
3.0000 mL | Freq: Four times a day (QID) | RESPIRATORY_TRACT | Status: DC
  Administered 2014-08-12 – 2014-08-14 (×7): 3 mL via RESPIRATORY_TRACT
  Filled 2014-08-12 (×8): qty 3

## 2014-08-12 MED ORDER — IPRATROPIUM-ALBUTEROL 0.5-2.5 (3) MG/3ML IN SOLN: 3.0000 mL | Freq: Four times a day (QID) | RESPIRATORY_TRACT | Status: AC

## 2014-08-12 MED ORDER — ATORVASTATIN CALCIUM 20 MG PO TABS
20.0000 mg | ORAL_TABLET | Freq: Every day | ORAL | Status: DC
  Administered 2014-08-12 – 2014-08-18 (×7): 20 mg via ORAL
  Filled 2014-08-12 (×8): qty 1

## 2014-08-12 MED ORDER — ISOSORBIDE MONONITRATE ER 30 MG PO TB24
30.0000 mg | ORAL_TABLET | Freq: Every day | ORAL | Status: DC
  Administered 2014-08-13 – 2014-08-19 (×7): 30 mg via ORAL
  Filled 2014-08-12 (×8): qty 1

## 2014-08-12 MED ORDER — ACETAMINOPHEN 325 MG PO TABS
325.0000 mg | ORAL_TABLET | ORAL | Status: DC | PRN
  Administered 2014-08-14 (×2): 650 mg via ORAL
  Administered 2014-08-15: 325 mg via ORAL
  Filled 2014-08-12 (×3): qty 2

## 2014-08-12 MED ORDER — ASPIRIN 325 MG PO TABS
325.0000 mg | ORAL_TABLET | Freq: Every day | ORAL | Status: DC
  Administered 2014-08-13 – 2014-08-19 (×7): 325 mg via ORAL
  Filled 2014-08-12 (×8): qty 1

## 2014-08-12 MED ORDER — LEVOTHYROXINE SODIUM 75 MCG PO TABS: 75.0000 ug | ORAL_TABLET | Freq: Every day | ORAL | Status: DC

## 2014-08-12 MED ORDER — BUDESONIDE 0.25 MG/2ML IN SUSP: 0.2500 mg | Freq: Four times a day (QID) | RESPIRATORY_TRACT | Status: AC

## 2014-08-12 MED ORDER — FERROUS SULFATE 325 (65 FE) MG PO TABS
325.0000 mg | ORAL_TABLET | Freq: Every day | ORAL | Status: DC
  Administered 2014-08-13: 325 mg via ORAL
  Filled 2014-08-12 (×3): qty 1

## 2014-08-12 MED ORDER — SORBITOL 70 % SOLN: 30.0000 mL | Freq: Every day | Status: DC | PRN

## 2014-08-12 MED ORDER — CARVEDILOL 6.25 MG PO TABS: 3.1250 mg | ORAL_TABLET | Freq: Two times a day (BID) | ORAL | Status: DC

## 2014-08-12 MED ORDER — ENOXAPARIN SODIUM 40 MG/0.4ML ~~LOC~~ SOLN
40.0000 mg | SUBCUTANEOUS | Status: DC
  Administered 2014-08-13 – 2014-08-18 (×6): 40 mg via SUBCUTANEOUS
  Filled 2014-08-12 (×8): qty 0.4

## 2014-08-12 NOTE — Evaluation (Signed)
Occupational Therapy Evaluation Patient Details Name: Aubreyanna Dorrough MRN: [id number] DOB: [birth year] Today's Date: 08/12/2014    History of Present Illness 8 F with OHS, O2 dependence, CKD, chronic severe edema admitted to [facility] 2/23 with worsening edema. Treated with diuresis and empiric abx. Intubated 3/02. Failed extubation 3/10. Transferred to [facility] 3/11. Extubated 3/15.   Clinical Impression   Patient required assistance with self-care PTA. Patient currently requires up to +2 assist with functional mobility/transfers, and self-care tasks. Patient will benefit from acute OT to increase overall independence in the areas of ADLs, functional mobility, and overall safety in order to safely discharge to venue listed below. Patient remained at least 98% on 4 L of 02 during therapy session. Patient motivated and very willing to work with therapist. Believe patient will benefit from comprehensive rehabilitation on inpatient rehab prior to going home with her husband.     Follow Up Recommendations  CIR;Supervision/Assistance - 24 hour    Equipment Recommendations   (TBD)    Recommendations for Other Services  None at this time  Precautions / Restrictions Precautions Precautions: Fall Restrictions Weight Bearing Restrictions: No      Mobility Bed Mobility Overal bed mobility: Needs Assistance Bed Mobility: Supine to Sit     Supine to sit: Mod assist;HOB elevated     General bed mobility comments: Mod A to follow through with trunk elevation to sit EOB. Cues for use of rails. HOB elevated. increased effort  Transfers Overall transfer level: Needs assistance Equipment used: Rolling walker (2 wheeled) Transfers: Sit to/from [facility] Sit to Stand: Max assist;+2 physical assistance;+2 safety/equipment Stand pivot transfers: Mod assist;+2 physical assistance       General transfer comment: Utilized stedy this session for sit to stand as patient unable to  stand with use of RW. +2 max A to promote anterior translation and power up into full stand. Once weight shift up and over knee patient and to stand fully erect with +2 min A. Patient stood x4 with use of stedy to promote increased activity tolerance and repeatition.     Balance Overall balance assessment: Needs assistance Sitting-balance support: No upper extremity supported;Feet supported Sitting balance-Leahy Scale: Fair     Standing balance support: Bilateral upper extremity supported;During functional activity Standing balance-Leahy Scale: Poor    ADL Overall ADL's : Needs assistance/impaired Eating/Feeding: Set up;Sitting   Grooming: Set up;Sitting   Upper Body Bathing: Minimal assitance;Sitting   Lower Body Bathing: Total assistance;Sit to/from stand;Cueing for safety   Upper Body Dressing : Minimal assistance;Sitting   Lower Body Dressing: Total assistance;Sit to/from stand;Cueing for safety   Toilet Transfer: +2 for physical assistance;Maximal assistance Toilet Transfer Details (indicate cue type and reason): using STEDY Toileting- Clothing Manipulation and Hygiene: Total assistance;Sit to/from stand;Cueing for safety Toileting - Clothing Manipulation Details (indicate cue type and reason): using STEDY     Functional mobility during ADLs: Maximal assistance;+2 for physical assistance General ADL Comments: Patient with decreased oxygen support and on 4 L of 02, sats remained at least 98% during therapy session. Used STEDY for patient and therapist safety during transfers. Patient required cues for energy conservation techniques, including pursed lip breathing. Patient unable to reach BLEs for LB ADLs and stated her husband assisted her with LB ADLs for the past year at least. Educated patient on use of AE and patient stated she wants to be more independent with her self-care tasks.     Pertinent Vitals/Pain Pain Assessment: No/denies pain     Hand  Dominance Left    Extremity/Trunk Assessment Upper Extremity Assessment Upper Extremity Assessment: Generalized weakness;RUE deficits/detail RUE Deficits / Details: Patient with premorbid shoulder limitations. During eval, pt with decreased shoulder A/PROM   Lower Extremity Assessment Lower Extremity Assessment: Defer to PT evaluation       Communication Communication Communication: No difficulties   Cognition Arousal/Alertness: Awake/alert Behavior During Therapy: WFL for tasks assessed/performed Overall Cognitive Status: Within Functional Limits for tasks assessed             Home Living Family/patient expects to be discharged to:: Private residence Living Arrangements: Spouse/significant other Available Help at Discharge: Family;Available 24 hours/day Type of Home: House Home Access: Ramped entrance     Home Layout: One level     Bathroom Shower/Tub: Walk-in shower;Door   [employer] Toilet: Standard     Home Equipment: Environmental consultant - 2 wheels;Walker - 4 wheels;Wheelchair - [employer];Shower seat          Prior Functioning/Environment Level of Independence: Needs assistance  Gait / Transfers Assistance Needed: Ambulates with RW in house, rollator outside. ADL's / Homemaking Assistance Needed: Husband would assist with bath.        OT Diagnosis: Generalized weakness;Acute pain   OT Problem List: Decreased strength;Decreased range of motion;Decreased activity tolerance;Impaired balance (sitting and/or standing);Decreased safety awareness;Decreased knowledge of use of DME or AE;Decreased knowledge of precautions;Pain   OT Treatment/Interventions: Self-care/ADL training;Therapeutic exercise;Energy conservation;DME and/or AE instruction;Therapeutic activities;Patient/family education;Balance training    OT Goals(Current goals can be found in the care plan section) Acute Rehab OT Goals Patient Stated Goal: Go to rehab OT Goal Formulation: With patient Time For Goal  Achievement: 08/19/14 Potential to Achieve Goals: Good ADL Goals Pt Will Perform Grooming: standing;with mod assist Pt Will Perform Upper Body Bathing: with supervision;sitting Pt Will Perform Lower Body Bathing: with mod assist;sit to/from stand;with adaptive equipment Pt Will Perform Upper Body Dressing: with supervision;sitting Pt Will Perform Lower Body Dressing: with mod assist;sit to/from stand;with adaptive equipment Pt Will Transfer to Toilet: with mod assist;bedside commode Pt Will Perform Toileting - Clothing Manipulation and hygiene: with mod assist;sit to/from stand Additional ADL Goal #1: Patient will verbalize at least 3 energy conservation techniques during functional activities  OT Frequency: Min 2X/week   Barriers to D/C:    None known at this time       Co-evaluation PT/OT/SLP Co-Evaluation/Treatment: Yes Reason for Co-Treatment: Complexity of the patient's impairments (multi-system involvement);For patient/therapist safety PT goals addressed during session: Mobility/safety with mobility;Balance;Strengthening/ROM OT goals addressed during session: ADL's and self-care;Strengthening/ROM      End of Session Equipment Utilized During Treatment: Gait belt;Rolling walker;Other (comment) (STEDY) Nurse Communication: Need for lift equipment;Mobility status  Activity Tolerance: Patient tolerated treatment well Patient left: in chair;with call bell/phone within reach   Time: 0820-0844 OT Time Calculation (min): 24 min Charges:  OT General Charges $OT Visit: 1 Procedure OT Evaluation $Initial OT Evaluation Tier I: 1 Procedure  Lerlene Treadwell , MS, OTR/L, CLT Pager: [phone]  08/12/2014, 9:21 AM

## 2014-08-12 NOTE — Progress Notes (Signed)
Rehab admissions - we did receive insurance approval from [facility] Medicare and I updated pt by phone.Ppt/husband are agreeable to coming to inpatient rehab. I spoke with Dr. Erlinda Hong and received medical clearance. We will admit pt to inpatient rehab today.  I updated Heather, case Freight forwarder and Randall Hiss, Education officer, museum. I will meet with pt shortly to complete admission paperwork.  Please call me with any questions. Thanks.  Nanetta Batty, PT Rehabilitation Admissions Coordinator [phone]

## 2014-08-12 NOTE — Progress Notes (Signed)
Placed patient on CPAP for the night with oxygen set at 4lpm ° °

## 2014-08-12 NOTE — Care Management (Signed)
IM from Medicare given to Patient. Magdalen Spatz RN BSN

## 2014-08-12 NOTE — Progress Notes (Signed)
Patient ID: Meagan Campbell, female   DOB: 10-25-1940, 74 y.o.   MRN: [id number] Patient admitted to [phone] via bed, escorted by nursing staff.  Patient verbalized understanding of rehab process, signed fall safety agreement.  Patient appears to be in no immediate distress at this time.  Will continue to monitor.  Brita Romp, RN

## 2014-08-12 NOTE — Progress Notes (Signed)
Rehab admissions - I am following pt's case and submitted OT evaluation this am to [facility] [employer]. We continue to wait for insurance authorization for inpatient rehab. I did speak with Vangie Bicker, case manager Rn with [facility] [employer] (phone: [phone]). Jenny Reichmann stated they are processing pt's case and we will have a determination later today.  I met with pt/husband to share this news and went over copay information with them as well.   I will keep the pt/family and medical team aware of any updates.  I updated Randall Hiss, social work as well.  Thanks.  Nanetta Batty, PT Rehabilitation Admissions Coordinator [phone]

## 2014-08-12 NOTE — H&P (View-Only) (Signed)
Physical Medicine and Rehabilitation Admission H&P    Chief Complaint :Weakness  HPI: Meagan Campbell is a 74 y.o. right handed female with history of severe OHS/BiPAP dependent, COPD on oxygen for many years, chronic kidney disease with baseline creatinine 7.62, CHF, diastolic congestive heart failure, hypertension with atrial fibrillation. Independent prior to admission using a walker living with her husband. Presented presented to [facility] 07/19/2014 with increased weight gain and shortness of breath. Treated with aggressive diuresis as well as findings of urinary tract infection placed on broad-spectrum antibiotics. Developed increased shortness of breath 07/27/2014 with respiratory arrest and was intubated. Extubated 08/04/2014 but required reintubation and transferred to [facility] 08/05/2014 for ongoing care per critical care medicine. Hospital course MRSA PCR screening positive. Patient was extubated 08/09/2014 with careful O2 titration. Subcutaneous Lovenox for DVT prophylaxis. Remains on amiodarone for history of atrial fibrillation. Echocardiogram remains pending. Tolerating a regular diet. Physical therapy evaluation completed 08/10/2014. Patient noted still to be limited by endurance fatigue factors. Recommendations made for physical medicine rehabilitation consult.Patient was admitted for a comprehensive rehabilitation program  ROS Review of Systems  Respiratory: Positive for shortness of breath.  Cardiovascular: Positive for palpitations and leg swelling.  Psychiatric/Behavioral: The patient has insomnia Neurological. Restless leg syndrome.  All other systems reviewed and are negative  Past Medical History  Diagnosis Date  . Anemia   . Edema   . Intention tremor   . Lymphedema   . Atrial fibrillation   . Chronic respiratory failure   . Respiratory failure   . Congestive heart failure   . Insomnia   . Adenomatous colon polyp   . MRSA (methicillin  resistant staph aureus) culture positive   . Hypertension   . Proteinuria   . Hyperlipidemia   . Restless legs   . Idiopathic peripheral neuropathy   . Urinary tract infection   . COPD (chronic obstructive pulmonary disease)   . On home oxygen therapy     "2L at night" (08/10/2014)  . PNA (pneumonia) 05/2013; 05/2014  . OSA on CPAP   . Type II diabetes mellitus   . DDD (degenerative disc disease)   . Arthritis     "joints" (08/10/2014   Past Surgical History  Procedure Laterality Date  . Shoulder arthroscopy w/ rotator cuff repair Left   . Abdominal hysterectomy    . Laparoscopic cholecystectomy    . Knee arthroscopy Right   . Cataract extraction w/ intraocular lens  implant, bilateral     Family History  Problem Relation Age of Onset  . Diabetes Sister   . Hypertension Sister   . Cirrhosis Father   . Cancer - Colon Mother   . Cancer Brother     rectal cancer   Social History:  reports that she has never smoked. She has never used smokeless tobacco. She reports that she does not drink alcohol or use illicit drugs. Allergies:  Allergies  Allergen Reactions  . Codeine     nausea  . Darvon [Propoxyphene]     nausea   Medications Prior to Admission  Medication Sig Dispense Refill  . ACCU-CHEK AVIVA PLUS test strip Use as directed    . amiodarone (PACERONE) 200 MG tablet Take 200 mg by mouth daily.    Marland Kitchen aspirin 325 MG tablet Take 325 mg by mouth daily.    . Blood Glucose Calibration (ACCU-CHEK AVIVA) SOLN Use as directed    . Blood Glucose Monitoring Suppl (ACCU-CHEK AVIVA PLUS) W/DEVICE KIT Use as  directed    . carbidopa-levodopa (SINEMET IR) 25-250 MG per tablet Take 1 tablet by mouth 3 (three) times daily.    . carvedilol (COREG) 6.25 MG tablet Take 6.25 mg by mouth 2 (two) times daily.    . ferrous sulfate 325 (65 FE) MG tablet Take 325 mg by mouth daily with breakfast.    . fosinopril (MONOPRIL) 40 MG tablet Take 40 mg by mouth daily.    . furosemide (LASIX) 40 MG  tablet Take 80 mg by mouth 2 (two) times daily.  0  . furosemide (LASIX) 80 MG tablet Take 120 mg by mouth 2 (two) times daily.    Marland Kitchen gabapentin (NEURONTIN) 300 MG capsule Take 300 mg by mouth daily.     . hydrALAZINE (APRESOLINE) 50 MG tablet Take 50 mg by mouth 3 (three) times daily.    . insulin aspart protamine- aspart (NOVOLOG MIX 70/30) (70-30) 100 UNIT/ML injection Inject 60 Units into the skin 2 (two) times daily with a meal.     . ipratropium-albuterol (DUONEB) 0.5-2.5 (3) MG/3ML SOLN Take 3 mLs by nebulization 2 (two) times daily.     . isosorbide mononitrate (IMDUR) 30 MG 24 hr tablet Take 30 mg by mouth daily.    Marland Kitchen levothyroxine (SYNTHROID, LEVOTHROID) 50 MCG tablet Take 50 mcg by mouth daily.  0  . magnesium oxide (MAG-OX) 400 MG tablet Take 400 mg by mouth 2 (two) times daily.    . Magnesium Oxide 400 (240 MG) MG TABS Take 1 tablet by mouth 2 (two) times daily.  3  . Melatonin 5 MG TABS Take 1 tablet by mouth at bedtime.     Marland Kitchen nystatin (MYCOSTATIN/NYSTOP) 100000 UNIT/GM POWD Apply topically as needed.   2  . Omega-3 Fatty Acids (FISH OIL CONCENTRATE PO) Take 2 capsules by mouth 2 (two) times daily.     . ondansetron (ZOFRAN) 4 MG tablet Take 1 tablet by mouth every 4 (four) hours as needed for nausea or vomiting.     Marland Kitchen rOPINIRole (REQUIP) 4 MG tablet Take 4 mg by mouth at bedtime. 2 tabs at bed time    . simvastatin (ZOCOR) 40 MG tablet Take 40 mg by mouth daily.    Marland Kitchen ULTILET CLASSIC LANCETS MISC by Does not apply route.      Home: Home Living Family/patient expects to be discharged to:: Private residence Living Arrangements: Spouse/significant other Available Help at Discharge: Family, Available 24 hours/day Type of Home: House Home Access: [place]: One [place]: Environmental consultant - 2 wheels, Environmental consultant - 4 wheels, Wheelchair - manual, Bedside commode, Shower seat   Functional History: Prior Function Level of Independence: Needs assistance Gait /  Transfers Assistance Needed: Ambulates with RW in house, rollator outside. ADL's / Homemaking Assistance Needed: Husband would assist with bath.  Functional Status:  Mobility: Bed Mobility Overal bed mobility: Needs Assistance Bed Mobility: Supine to Sit Supine to sit: Min assist, HOB elevated General bed mobility comments: min assist for support through hand to allow pt to pull self into seated position. Able to scoot to edge of bed without assist. Requires extra time. Cues to breath frequently. Transfers Overall transfer level: Needs assistance Equipment used: Rolling walker (2 wheeled) Transfers: Sit to/from Stand, Stand Pivot Transfers Sit to Stand: Min assist, +2 physical assistance, From elevated surface Stand pivot transfers: Mod assist, +2 physical assistance General transfer comment: Min assist +2 for boost to stand and stability for posterior lean initially. Pivoting well until very end  required Mod assist to control descent into chair as pt began to rush. Stating she felt lightheaded.      ADL:    Mobility Bed Mobility Overal bed mobility: Needs Assistance Bed Mobility: Supine to Sit     Supine to sit: Mod assist;HOB elevated     General bed mobility comments: Mod A to follow through with trunk elevation to sit EOB. Cues for use of rails. HOB elevated. increased effort  Transfers Overall transfer level: Needs assistance Equipment used: Rolling walker (2 wheeled) Transfers: Sit to/from [facility] Sit to Stand: Max assist;+2 physical assistance;+2 safety/equipment Stand pivot transfers: Mod assist;+2 physical assistance       General transfer comment: Utilized stedy this session for sit to stand as patient unable to stand with use of RW. +2 max A to promote anterior translation and power up into full stand. Once weight shift up and over knee patient and to stand fully erect with +2 min A. Patient stood x4 with use of stedy to promote increased  activity tolerance and repeatition.     Balance Overall balance assessment: Needs assistance Sitting-balance support: No upper extremity supported;Feet supported Sitting balance-Leahy Scale: Fair     Standing balance support: Bilateral upper extremity supported;During functional activity Standing balance-Leahy Scale: Poor         Cognition: Cognition Overall Cognitive Status: Within Functional Limits for tasks assessed Orientation Level: Oriented X4 Cognition Arousal/Alertness: Awake/alert Behavior During Therapy: WFL for tasks assessed/performed Overall Cognitive Status: Within Functional Limits for tasks assessed  Physical Exam: Blood pressure 141/39, pulse 77, temperature 98.4 F (36.9 C), temperature source Oral, resp. rate 20, height _0  (1.676 m), weight 127.007 kg (280 lb), SpO2 95 %. Physical Exam Constitutional: She is oriented to person, place, and time.  74 year old obese female in no distress HENT: oral mucosa moist, pink Head: Normocephalic.  Eyes: EOM are normal.  Neck: Normal range of motion. Neck supple. No thyromegaly present.  Cardiovascular:  Cardiac rate controlled without murmur Respiratory: no dyspnea, no wheezes or rales Lungs decreased breath sounds at the bases with good inspiratory effort--otherwise clear GI: Soft. Bowel sounds are normal. She exhibits no distension.  Musculoskeletal:  +1 edema lower extremities.  Neurological: She is alert and oriented to person, place, and time. A cranial nerve deficit is present. She exhibits normal muscle tone. Mild essential tremor MMT-- Right: 3+/5 deltoid, 4-/5 bicep, 4-/5 tricep, 4/5 wrist extension, 4+/5 hand intrinsics. 1+/5 hip flexor, 2/5 knee extension, 3/5 ankle dorsiflexion, 3/5 ankle plantarflexion. Left: 3+/5 deltoid, 4-/5 bicep, 4-/5 tricep, 4/5 wrist extension, 4+/5 hand intrinsics. 1+/5 hip flexor, 2/5 knee extension, 3/5 ankle dorsiflexion, 3/5 ankle plantarflexion. No sensory  deficits Skin: Skin is warm and dry. Patient sitting up and share with [place] buttocks was not examined  Psychiatric: generally pleasant and cooperative. Appears a little anxious   Results for orders placed or performed during the hospital encounter of 08/05/14 (from the past 48 hour(s))  Glucose, capillary     Status: Abnormal   Collection Time: 08/09/14 11:36 AM  Result Value Ref Range   Glucose-Capillary 173 (H) 70 - 99 mg/dL  Glucose, capillary     Status: Abnormal   Collection Time: 08/09/14  3:55 PM  Result Value Ref Range   Glucose-Capillary 160 (H) 70 - 99 mg/dL  Glucose, capillary     Status: Abnormal   Collection Time: 08/09/14  7:14 PM  Result Value Ref Range   Glucose-Capillary 118 (H) 70 - 99 mg/dL  Glucose,  capillary     Status: Abnormal   Collection Time: 08/09/14 11:15 PM  Result Value Ref Range   Glucose-Capillary 135 (H) 70 - 99 mg/dL   Comment 1 Notify RN   Glucose, capillary     Status: Abnormal   Collection Time: 08/10/14  3:52 AM  Result Value Ref Range   Glucose-Capillary 149 (H) 70 - 99 mg/dL   Comment 1 Notify RN   Basic metabolic panel     Status: Abnormal   Collection Time: 08/10/14  5:14 AM  Result Value Ref Range   Sodium 144 135 - 145 mmol/L   Potassium 3.7 3.5 - 5.1 mmol/L    Comment: DELTA CHECK NOTED   Chloride 103 96 - 112 mmol/L   CO2 35 (H) 19 - 32 mmol/L   Glucose, Bld 166 (H) 70 - 99 mg/dL   BUN 24 (H) 6 - 23 mg/dL   Creatinine, Ser 1.54 (H) 0.50 - 1.10 mg/dL   Calcium 9.0 8.4 - 10.5 mg/dL   GFR calc non Af Amer 32 (L) >90 mL/min   GFR calc Af Amer 37 (L) >90 mL/min    Comment: (NOTE) The eGFR has been calculated using the CKD EPI equation. This calculation has not been validated in all clinical situations. eGFR's persistently <90 mL/min signify possible Chronic Kidney Disease.    Anion gap 6 5 - 15  CBC     Status: Abnormal   Collection Time: 08/10/14  5:14 AM  Result Value Ref Range   WBC 6.8 4.0 - 10.5 K/uL   RBC 3.61 (L) 3.87  - 5.11 MIL/uL   Hemoglobin 9.5 (L) 12.0 - 15.0 g/dL   HCT 33.3 (L) 36.0 - 46.0 %   MCV 92.2 78.0 - 100.0 fL   MCH 26.3 26.0 - 34.0 pg   MCHC 28.5 (L) 30.0 - 36.0 g/dL   RDW 19.2 (H) 11.5 - 15.5 %   Platelets 177 150 - 400 K/uL    Comment: SPECIMEN CHECKED FOR CLOTS REPEATED TO VERIFY PLATELET COUNT CONFIRMED BY SMEAR   Glucose, capillary     Status: Abnormal   Collection Time: 08/10/14  8:19 AM  Result Value Ref Range   Glucose-Capillary 133 (H) 70 - 99 mg/dL  Glucose, capillary     Status: Abnormal   Collection Time: 08/10/14  5:13 PM  Result Value Ref Range   Glucose-Capillary 212 (H) 70 - 99 mg/dL  Glucose, capillary     Status: Abnormal   Collection Time: 08/10/14  9:11 PM  Result Value Ref Range   Glucose-Capillary 164 (H) 70 - 99 mg/dL  Glucose, capillary     Status: Abnormal   Collection Time: 08/11/14  7:40 AM  Result Value Ref Range   Glucose-Capillary 153 (H) 70 - 99 mg/dL   Dg Chest Port 1 View  08/10/2014   CLINICAL DATA:  Respiratory failure  EXAM: PORTABLE CHEST - 1 VIEW  COMPARISON:  08/09/2014; 08/07/2014; 08/06/2014  FINDINGS: Grossly unchanged enlarged cardiac silhouette and mediastinal contour given persistently reduced lung volumes. Atherosclerotic plaque within the thoracic aorta is. Interval extubation and removal of enteric tube. Otherwise, stable positioning of remaining support apparatus. No pneumothorax. Pulmonary vasculature remains indistinct with cephalization of flow. Heterogeneous opacities within the right mid and left lower lung are unchanged. No definite pleural effusion. Unchanged bones including old left-sided rib fractures.  IMPRESSION: 1. Interval extubation and removal of enteric tube. Otherwise, stable positioning of remaining support apparatus. No pneumothorax. 2. Similar findings of pulmonary edema  and bibasilar opacities, left greater than right, atelectasis versus infiltrate.   Electronically Signed   By: Sandi Mariscal M.D.   On: 08/10/2014  07:11       Medical Problem List and Plan: 1. 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 2.  DVT Prophylaxis/Anticoagulation: Lovenox.Monitor for any bleeding episodes 3. Pain Management: Tylenol as needed 4. COPD/OSA. continue nebulizers. Titrate oxygen. Followed by Dr. Joya Gaskins 5. Neuropsych: This patient is capable of making decisions on her own behalf. 6. Skin/Wound Care: Routine skin checks/need to check for any pressure ulcers 7. Fluids/Electrolytes/Nutrition: Strict I&O.Follow up labs 8. Chronic renal insufficiency. Baseline creatinine 2.62. Follow-up chemistries 9. Diastolic congestive heart failure. Lasix 80 mg twice a day. Monitor for any signs of fluid overload. 10. Hypertension/atrial fibrillation. Amiodarone 200 mg daily, Imdur 30 mg daily. Coreg recently held. Cardiac rate control. Monitor with increased mobility. Echocardiogram remains pending from 08/11/2014 11. Hypothyroidism. Synthroid 12. Diabetes mellitus with peripheral neuropathy. Hemoglobin A1c 6.3. Check blood sugars before meals and at bedtime. Sliding scale insulin. Patient on 70/30 insulin 60 units twice a day prior to admission. Resume as tolerated 13. MRSA PCR screening positive. Contact precautions 14. Hyperlipidemia. Lipitor 15. RLS. Sinemet and Requip.   Post Admission Physician Evaluation: 1. Functional deficits secondary  to critical illness myopathy. 2. Patient is admitted to receive collaborative, interdisciplinary care between the physiatrist, rehab nursing staff, and therapy team. 3. Patient's level of medical complexity and substantial therapy needs in context of that medical necessity cannot be provided at a lesser intensity of care such as a SNF. 4. Patient has experienced substantial functional loss from his/her baseline which was documented above under the "Functional History" and "Functional Status" headings.  Judging by the patient's diagnosis, physical  exam, and functional history, the patient has potential for functional progress which will result in measurable gains while on inpatient rehab.  These gains will be of substantial and practical use upon discharge  in facilitating mobility and self-care at the household level. 5. Physiatrist will provide 24 hour management of medical needs as well as oversight of the therapy plan/treatment and provide guidance as appropriate regarding the interaction of the two. 6. 24 hour rehab nursing will assist with bladder management, bowel management, safety, skin/wound care, disease management, medication administration, pain management and patient education  and help integrate therapy concepts, techniques,education, etc. 7. PT will assess and treat for/with: Lower extremity strength, range of motion, stamina, balance, functional mobility, safety, adaptive techniques and equipment, NMR, pain mgt, activity tolerance.   Goals are: supervision to min assist. 8. OT will assess and treat for/with: ADL's, functional mobility, safety, upper extremity strength, adaptive techniques and equipment, NMR, pain mgt, activity tolerance, ego support, community reintegration.   Goals are: supervision to min assist+. Therapy may proceed with showering this patient. 9. SLP will assess and treat for/with: n/a.  Goals are: n/a. 10. Case Management and Social Worker will assess and treat for psychological issues and discharge planning. 11. Team conference will be held weekly to assess progress toward goals and to determine barriers to discharge. 12. Patient will receive at least 3 hours of therapy per day at least 5 days per week. 13. ELOS: 15-20 days       14. Prognosis:  excellent     Meredith Staggers, MD, [place] [facility] 08/12/2014   08/11/2014

## 2014-08-12 NOTE — Discharge Summary (Addendum)
Discharge Summary  Meagan Campbell IZT:[id number] DOB: [birth year]  PCP: Meagan Dress, MD  Admit date: 08/05/2014 Discharge date: 08/12/2014  Time spent: >38mins  Recommendations for Outpatient Follow-up:  1. Patient is to be discharged to CIR, CIR physician continue monitor bp/chf meds adjustment 2. Patient will need cardiology/pmd follow up at discharge from CIR.  Discharge Diagnoses:  Active Hospital Problems   Diagnosis Date Noted  . Acute respiratory failure with hypercapnia 08/05/2014  . Respiratory failure 08/05/2014  . Chronic pain 06/28/2014  . Diabetes mellitus type 2, uncontrolled 06/28/2014  . Obesity hypoventilation syndrome 08/11/2013  . Morbid obesity 08/11/2013  . CKD (chronic kidney disease) stage 4, GFR 15-29 ml/min 08/10/2013  . Anemia   . OSA (obstructive sleep apnea)   . Atrial fibrillation   . Chronic respiratory failure   . DM type 2, uncontrolled, with renal complications   . Chronic diastolic heart failure   . Idiopathic peripheral neuropathy   . Restless legs   . Hyperlipidemia   . Proteinuria   . MRSA (methicillin resistant staph aureus) culture positive   . Hypertension     Resolved Hospital Problems   Diagnosis Date Noted Date Resolved  . Urinary tract infection  11/02/2013    Discharge Condition: stable  Diet recommendation: heart healthy/carb monidfied  Filed Weights   08/09/14 0200 08/10/14 0400 08/11/14 0500  Weight: 126.9 kg (279 lb 12.2 oz) 127.9 kg (281 lb 15.5 oz) 127.007 kg (280 lb)    History of present illness:  [name] with known severe OHS(bipap dependent), COPD on O2 for years, CKD stage 4 (followed by Meagan Campbell), CAF(on amio), CHF (on diuretics) who required intubation several years ago and was on ventilator for a prolonged period. She is followed by Meagan Campbell for her OHS/OSA. She presented on 2/23 to [facility] with increased weight gain,hypercarbia and SOB. Admitted and treated with agressive  diuresis and abx for UTI and Pna. She developed increased SOB 3/2 and had resp arrest and was intubated. She was extubated but on 3/10 required reintubation and was transferred to [facility] .  Hospital Course:  Active Problems:   Anemia   OSA (obstructive sleep apnea)   Atrial fibrillation   Chronic respiratory failure   DM type 2, uncontrolled, with renal complications   Chronic diastolic heart failure   Idiopathic peripheral neuropathy   Restless legs   Hyperlipidemia   Proteinuria   MRSA (methicillin resistant staph aureus) culture positive   Hypertension   CKD (chronic kidney disease) stage 4, GFR 15-29 ml/min   Obesity hypoventilation syndrome   Morbid obesity   Chronic pain   Diabetes mellitus type 2, uncontrolled   Acute respiratory failure with hypercapnia   Respiratory failure  Acute on chronic Hypoxic respiratory failure: likely secondary to chf excerbation,  OHS. She was originally admitted to [place] on 2/23, Treated with diuresis and empiric abx. Intubated 3/02. Failed extubation 3/10, reintubated and transferred to [facility] ICU on 3/11, with continued diuresis, patient eventually able to be successfully extubated on 3/15. She has showed steady improvement with continued diuresis. She is determined to be qualified for CIR admission. She is on home oxygen chronically and bipap at night chronically. Coreg was initially held, restarted coreg at low dose, CIR MD to continue titrate coreg/lasix dose. Echocardiogram pending.  H/o paroxysmal Afib, sinus rhythm in the hospital on amiodarone/coreg/asa 325.   H/o insulin dependent DM2,a1c 6.3,continue SSI, patient was on large dose of scheduled insulin at home, she did not  require scheduled dose insulin here, she is discharged on SSI only, insulin dose will likely need to be further adjusted.  H/o obesity/ossa on bipap  H/o CKD IV, stable at baseline.   H/o hypothyroidism: synthroid dose increased. Repeat tsh in 6wks.  MRSA  colonization, finished decolonization treatment.  Code status: DNR  Procedures:  Intubation 3/2 to 3/8  Intubation 3/10 to 3/15  Consultations:  pccm  ANTIMICROBIALS:  Vanc 3/11 >> 3/14 Pip-tazo 3/12 >> 3/14  Discharge Exam: BP 127/59 mmHg  Pulse 77  Temp(Src) 97.6 F (36.4 C) (Oral)  Resp 20  Ht $R'5\' 6"'oW$  (1.676 m)  Wt 127.007 kg (280 lb)  BMI 45.21 kg/m2  SpO2 96%  General: NAD, obese Neuro: MAEs HEENT: WNL Cardiovascular: Reg, no M Lungs: Clear anteriorly, diminished at bases, no obvious rales/rhonchi/wheezes. Abdomen: Obese, soft, + bs Ext: 2+ symmetric edema  Discharge Instructions You were cared for by a hospitalist during your hospital stay. If you have any questions about your discharge medications or the care you received while you were in the hospital after you are discharged, you can call the unit and asked to speak with the hospitalist on call if the hospitalist that took care of you is not available. Once you are discharged, your primary care physician will handle any further medical issues. Please note that NO REFILLS for any discharge medications will be authorized once you are discharged, as it is imperative that you return to your primary care physician (or establish a relationship with a primary care physician if you do not have one) for your aftercare needs so that they can reassess your need for medications and monitor your lab values.      Discharge Instructions    Increase activity slowly    Complete by:  As directed             Medication List    STOP taking these medications        fosinopril 40 MG tablet  Commonly known as:  MONOPRIL     hydrALAZINE 50 MG tablet  Commonly known as:  APRESOLINE     insulin aspart protamine- aspart (70-30) 100 UNIT/ML injection  Commonly known as:  NOVOLOG MIX 70/30     magnesium oxide 400 MG tablet  Commonly known as:  MAG-OX      TAKE these medications        ACCU-CHEK AVIVA PLUS test strip   Generic drug:  glucose blood  Use as directed     ACCU-CHEK AVIVA PLUS W/DEVICE Kit  Use as directed     ACCU-CHEK AVIVA Soln  Use as directed     amiodarone 200 MG tablet  Commonly known as:  PACERONE  Take 200 mg by mouth daily.     aspirin 325 MG tablet  Take 325 mg by mouth daily.     budesonide 0.25 MG/2ML nebulizer solution  Commonly known as:  PULMICORT  Take 2 mLs (0.25 mg total) by nebulization every 6 (six) hours.     carbidopa-levodopa 25-250 MG per tablet  Commonly known as:  SINEMET IR  Take 1 tablet by mouth 3 (three) times daily.     carvedilol 6.25 MG tablet  Commonly known as:  COREG  Take 0.5 tablets (3.125 mg total) by mouth 2 (two) times daily.     ferrous sulfate 325 (65 FE) MG tablet  Take 325 mg by mouth daily with breakfast.     FISH OIL CONCENTRATE PO  Take 2 capsules by  mouth 2 (two) times daily.     furosemide 40 MG tablet  Commonly known as:  LASIX  Take 80 mg by mouth 2 (two) times daily.     gabapentin 300 MG capsule  Commonly known as:  NEURONTIN  Take 300 mg by mouth daily.     insulin aspart 100 UNIT/ML injection  Commonly known as:  novoLOG  Inject 0-15 Units into the skin 3 (three) times daily with meals.     insulin aspart 100 UNIT/ML injection  Commonly known as:  novoLOG  Inject 0-5 Units into the skin at bedtime.     insulin aspart 100 UNIT/ML injection  Commonly known as:  novoLOG  Inject 4 Units into the skin 3 (three) times daily with meals.     ipratropium-albuterol 0.5-2.5 (3) MG/3ML Soln  Commonly known as:  DUONEB  Take 3 mLs by nebulization 2 (two) times daily.     ipratropium-albuterol 0.5-2.5 (3) MG/3ML Soln  Commonly known as:  DUONEB  Take 3 mLs by nebulization every 6 (six) hours.     isosorbide mononitrate 30 MG 24 hr tablet  Commonly known as:  IMDUR  Take 30 mg by mouth daily.     levothyroxine 75 MCG tablet  Commonly known as:  SYNTHROID, LEVOTHROID  Take 1 tablet (75 mcg total) by mouth  daily before breakfast.     Magnesium Oxide 400 (240 MG) MG Tabs  Take 1 tablet by mouth 2 (two) times daily.     Melatonin 5 MG Tabs  Take 1 tablet by mouth at bedtime.     nystatin 100000 UNIT/GM Powd  Apply topically as needed.     ondansetron 4 MG tablet  Commonly known as:  ZOFRAN  Take 1 tablet by mouth every 4 (four) hours as needed for nausea or vomiting.     rOPINIRole 4 MG tablet  Commonly known as:  REQUIP  Take 4 mg by mouth at bedtime. 2 tabs at bed time     simvastatin 40 MG tablet  Commonly known as:  ZOCOR  Take 40 mg by mouth daily.     ULTILET CLASSIC LANCETS Misc  by Does not apply route.       Allergies  Allergen Reactions  . Codeine     nausea  . Darvon [Propoxyphene]     nausea      The results of significant diagnostics from this hospitalization (including imaging, microbiology, ancillary and laboratory) are listed below for reference.    Significant Diagnostic Studies: Dg Chest Port 1 View  08/10/2014   CLINICAL DATA:  Respiratory failure  EXAM: PORTABLE CHEST - 1 VIEW  COMPARISON:  08/09/2014; 08/07/2014; 08/06/2014  FINDINGS: Grossly unchanged enlarged cardiac silhouette and mediastinal contour given persistently reduced lung volumes. Atherosclerotic plaque within the thoracic aorta is. Interval extubation and removal of enteric tube. Otherwise, stable positioning of remaining support apparatus. No pneumothorax. Pulmonary vasculature remains indistinct with cephalization of flow. Heterogeneous opacities within the right mid and left lower lung are unchanged. No definite pleural effusion. Unchanged bones including old left-sided rib fractures.  IMPRESSION: 1. Interval extubation and removal of enteric tube. Otherwise, stable positioning of remaining support apparatus. No pneumothorax. 2. Similar findings of pulmonary edema and bibasilar opacities, left greater than right, atelectasis versus infiltrate.   Electronically Signed   By: Sandi Mariscal M.D.    On: 08/10/2014 07:11   Dg Chest Port 1 View  08/09/2014   CLINICAL DATA:  Hypoxia/respiratory failure  EXAM: PORTABLE CHEST -  1 VIEW  COMPARISON:  August 07, 2014  FINDINGS: Endotracheal tube tip is 3.0 cm above the carina. Central catheter tip is in the right atrium. Nasogastric tube tip and side port are below the diaphragm. No pneumothorax. There is patchy bibasilar atelectatic change, slightly more on the left than on the right. Lungs elsewhere clear. Heart is mildly enlarged with pulmonary vascularity within normal limits. There is postoperative change in the lower cervical spine region.  IMPRESSION: Patchy bibasilar atelectasis. Tube and catheter positions as described without pneumothorax. No change in cardiac silhouette.   Electronically Signed   By: Lowella Grip III M.D.   On: 08/09/2014 07:31   Dg Chest Port 1 View  08/07/2014   CLINICAL DATA:  Respiratory failure, history diabetes, CHF, atrial fibrillation, hypertension  EXAM: PORTABLE CHEST - 1 VIEW  COMPARISON:  Portable exam 0546 hr compared to 08/06/2014  FINDINGS: Tip of endotracheal tube projects 15 mm above carina.  Nasogastric tube extends into stomach.  Enlargement of cardiac silhouette with vascular congestion.  Atherosclerotic calcification aorta.  Scattered areas of atelectasis and infiltrate in the mid to lower lungs bilaterally.  Small pleural effusion.  No pneumothorax.  Prior cervical spine fusion.  IMPRESSION: Persistent scattered areas of atelectasis and consolidation in both lungs little changed.   Electronically Signed   By: Lavonia Dana M.D.   On: 08/07/2014 08:12   Dg Chest Port 1 View  08/06/2014   CLINICAL DATA:  74 year old female, history of endotracheal tube placement  EXAM: PORTABLE CHEST - 1 VIEW  COMPARISON:  Multiple prior plain film, most recent 08/05/2014, 08/05/2014, 08/04/2014  FINDINGS: Cardiomediastinal silhouette likely unchanged 0 partially obscured by overlying lung and pleural disease.  Lung volumes are  low accentuating the interstitium the central vasculature.  Fluid/ atelectasis along the minor fissure. Retrocardiac opacity with blunting of left costophrenic angle on obscuration left hemidiaphragm.  No evidence of pneumothorax.  Unchanged position of endotracheal tube terminating 2.5 cm above the carina.  Unchanged position of enteric tube.  Surgical changes of prior cervical fixation again noted.  IMPRESSION: Unchanged appearance of the chest x-ray, with low lung volumes. Persistent left greater than right airspace disease may reflect consolidation, atelectasis, and/or pleural effusion.  Unchanged endotracheal tube, terminating 2.5 cm above the carina. Unchanged gastric tube.  Signed,  Dulcy Fanny. Earleen Newport, DO  [facility]  [facility]   Electronically Signed   By: Corrie Mckusick D.O.   On: 08/06/2014 08:30   Dg Chest Port 1 View  08/05/2014   CLINICAL DATA:  Respiratory failure  EXAM: PORTABLE CHEST - 1 VIEW  COMPARISON:  08/05/2014  FINDINGS: ET tube tip is above the carina. There is a nasogastric tube with tip below the GE junction. The heart size is normal. There is mild diffuse pulmonary edema. Atelectasis is noted within the right midlung and left base.  IMPRESSION: 1. Persistent low lung volumes with bilateral areas of atelectasis.   Electronically Signed   By: Kerby Moors M.D.   On: 08/05/2014 12:17    Microbiology: Recent Results (from the past 240 hour(s))  MRSA PCR Screening     Status: Abnormal   Collection Time: 08/05/14 10:00 AM  Result Value Ref Range Status   MRSA by PCR POSITIVE (A) NEGATIVE Final    Comment:        The GeneXpert MRSA Assay (FDA approved for NASAL specimens only), is one component of a comprehensive MRSA colonization surveillance program. It is not intended to  diagnose MRSA infection nor to guide or monitor treatment for MRSA infections. RESULT CALLED TO, READ BACK BY AND VERIFIED WITH: Curt Bears RN 15:35  08/05/14 ([email or web address])   Culture, blood (routine x 2)     Status: None   Collection Time: 08/05/14 12:15 PM  Result Value Ref Range Status   Specimen Description BLOOD RIGHT ANTECUBITAL  Final   Special Requests BOTTLES DRAWN AEROBIC ONLY [place]  Final   Culture   Final    NO GROWTH 5 DAYS Performed at [facility]    Report Status 08/11/2014 FINAL  Final  Urine culture     Status: None   Collection Time: 08/05/14 12:17 PM  Result Value Ref Range Status   Specimen Description URINE, RANDOM  Final   Special Requests Normal  Final   Colony Count NO GROWTH Performed at [facility]   Final   Culture NO GROWTH Performed at [facility]   Final   Report Status 08/06/2014 FINAL  Final  Culture, blood (routine x 2)     Status: None   Collection Time: 08/05/14 12:20 PM  Result Value Ref Range Status   Specimen Description BLOOD LEFT ANTECUBITAL  Final   Special Requests BOTTLES DRAWN AEROBIC ONLY 5CC  Final   Culture   Final    NO GROWTH 5 DAYS Performed at [facility]    Report Status 08/11/2014 FINAL  Final  Culture, respiratory (tracheal aspirate)     Status: None   Collection Time: 08/06/14 11:43 AM  Result Value Ref Range Status   Specimen Description TRACHEAL ASPIRATE  Final   Special Requests Normal  Final   Gram Stain   Final    ABUNDANT WBC PRESENT,BOTH PMN AND MONONUCLEAR RARE SQUAMOUS EPITHELIAL CELLS PRESENT NO ORGANISMS SEEN Performed at [facility]    Culture   Final    NO GROWTH 2 DAYS Performed at [facility]    Report Status 08/09/2014 FINAL  Final     Labs: Basic Metabolic Panel:  Recent Labs Lab 08/06/14 0235 08/07/14 0328 08/09/14 0422 08/10/14 0514 08/12/14 0548  NA 141 140 144 144  --   K 3.3* 3.9 2.8* 3.7  --   CL 93* 95* 99 103  --   CO2 35* 35* 35* 35*  --   GLUCOSE 127* 156* 185* 166*  --   BUN 28* 38* 34* 24*  --   CREATININE 2.62* 2.89* 1.96* 1.54*  --   CALCIUM 8.8 8.6 9.0 9.0  --     MG 2.0  --   --   --  2.0  PHOS 2.6  --   --   --  2.6   Liver Function Tests: No results for input(s): AST, ALT, ALKPHOS, BILITOT, PROT, ALBUMIN in the last 168 hours. No results for input(s): LIPASE, AMYLASE in the last 168 hours. No results for input(s): AMMONIA in the last 168 hours. CBC:  Recent Labs Lab 08/06/14 0235 08/07/14 0328 08/09/14 0422 08/10/14 0514  WBC 6.1 7.8 7.2 6.8  HGB 10.5* 9.7* 9.4* 9.5*  HCT 34.2* 32.7* 32.3* 33.3*  MCV 87.5 89.1 89.7 92.2  PLT 157 173 127* 177   Cardiac Enzymes: No results for input(s): CKTOTAL, CKMB, CKMBINDEX, TROPONINI in the last 168 hours. BNP: BNP (last 3 results) No results for input(s): BNP in the last 8760 hours.  ProBNP (last 3 results) No results for input(s): PROBNP in the last 8760 hours.  CBG:  Recent Labs Lab  08/11/14 1235 08/11/14 1731 08/11/14 2151 08/12/14 0735 08/12/14 1131  GLUCAP 200* 151* 162* 176* 201*       Signed:  Anh Mangano MD, PhD  [facility] 08/12/2014, 1:45 PM

## 2014-08-12 NOTE — Progress Notes (Signed)
Physical Therapy Treatment Patient Details Name: Meagan Campbell MRN: [id number] DOB: September 13, 1940 Today's Date: 08/12/2014    History of Present Illness 67 F with OHS, O2 dependence, CKD, chronic severe edema admitted to [facility] 2/23 with worsening edema. Treated with diuresis and empiric abx. Intubated 3/02. Failed extubation 3/10. Transferred to [facility] 3/11. Extubated 3/15.    PT Comments    Patient making some progress today with use of stedy and the ability to stand multiple time to increase endurance and activity tolerance. Patient would benefit from multiple hours of intense therapy to progress and regain functional independence and return home safely. Patient is a great CIR candidate and is hopeful that she will be accepted. Patient remained above 98% on 4L of o2 and was 3/4 on dyspnea scale with activity. Continue to recommend comprehensive inpatient rehab (CIR) for post-acute therapy needs.   Follow Up Recommendations  CIR     Equipment Recommendations  Other (comment) (TBD)    Recommendations for Other Services       Precautions / Restrictions Precautions Precautions: Fall Restrictions Weight Bearing Restrictions: No    Mobility  Bed Mobility Overal bed mobility: Needs Assistance Bed Mobility: Supine to Sit     Supine to sit: Mod assist;HOB elevated     General bed mobility comments: Mod A to follow through with trunk elevation to sit EOB. Cues for use of rails. HOB elevated. increased effort  Transfers Overall transfer level: Needs assistance   Transfers: Sit to/from Stand;Stand Pivot Transfers Sit to Stand: Max assist;+2 physical assistance;+2 safety/equipment         General transfer comment: Utilized stedy this session for sit to stand as patient unable to stand with use of RW. +2 max A to promote anterior translation and power up into full stand. Once weight shift up and over knee patient and to stand fully erect with +2 min A. Patient stood x4 with use  of stedy to promote increased activity tolerance and repeatition.   Ambulation/Gait                 Stairs            Wheelchair Mobility    Modified Rankin (Stroke Patients Only)       Balance     Sitting balance-Leahy Scale: Fair       Standing balance-Leahy Scale: Poor                      Cognition Arousal/Alertness: Awake/alert Behavior During Therapy: WFL for tasks assessed/performed Overall Cognitive Status: Within Functional Limits for tasks assessed                      Exercises General Exercises - Lower Extremity Ankle Circles/Pumps: AROM;Both;10 reps    General Comments        Pertinent Vitals/Pain Pain Assessment: No/denies pain    Home Living Family/patient expects to be discharged to:: Private residence Living Arrangements: Spouse/significant other Available Help at Discharge: Family;Available 24 hours/day Type of Home: House Home Access: Ramped entrance   Home Layout: One level Home Equipment: Walker - 2 wheels;Walker - 4 wheels;Wheelchair - [employer];Shower seat      Prior Function Level of Independence: Needs assistance  Gait / Transfers Assistance Needed: Ambulates with RW in house, rollator outside. ADL's / Homemaking Assistance Needed: Husband would assist with bath.     PT Goals (current goals can now be found in the care plan section) Progress towards PT goals:  Progressing toward goals    Frequency  Min 3X/week    PT Plan Current plan remains appropriate    Co-evaluation PT/OT/SLP Co-Evaluation/Treatment: Yes Reason for Co-Treatment: Complexity of the patient's impairments (multi-system involvement);For patient/therapist safety PT goals addressed during session: Mobility/safety with mobility;Balance;Strengthening/ROM       End of Session Equipment Utilized During Treatment: Oxygen;Gait belt Activity Tolerance: Patient limited by fatigue Patient left: in chair;with call bell/phone  within reach     Time: 0820-0844 PT Time Calculation (min) (ACUTE ONLY): 24 min  Charges:  $Therapeutic Activity: 8-22 mins                    G Codes:      Jacqualyn Posey 08/12/2014, 9:03 AM 08/12/2014 Jacqualyn Posey PTA [phone] pager [phone] office

## 2014-08-12 NOTE — Interval H&P Note (Signed)
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  The patient's history has been reviewed, patient examined, and there is no change in status.  Patient continues to be appropriate for intensive inpatient rehabilitation.  I have reviewed the patient's chart and labs.  Questions were answered to the patient's satisfaction.  SWARTZ,ZACHARY T 08/12/2014, 6:44 PM

## 2014-08-12 NOTE — Progress Notes (Signed)
Patient transferred to [phone].  VSS. All belongings sent with patient. Hospital BiPap machine sent with patient to use on 4W.  Family notified of transfer. Report given to [place], Therapist, sports.

## 2014-08-12 NOTE — PMR Pre-admission (Signed)
PMR Admission Coordinator Pre-Admission Assessment  Patient: Meagan Campbell is an 74 y.o., female MRN: [id number] DOB: 10/17/1940 Height: _0  (167.6 cm) Weight: 127.007 kg (280 lb)              Insurance Information HMO: yes    PPO:     PCP:      IPA:      80/20:      OTHER:  PRIMARY: [employer] Medicare      Policy#: [id number]      Subscriber: self CM Name: Loanne Drilling, RN      Phone#: [phone]     Fax#: [phone] Approval given on 08-12-14 from [place], [place]. Follow up will be with onsite reviewer Archie Patten, RN (phone: [phone]) Pre-Cert#: [id number]      Employer: retired Benefits:  Phone #: [phone]     Name: per [employer]. Date: 05-27-14     Deduct: none      Out of Pocket Max: [phone] (met $500.96)      Life Max: none CIR: $430 copay/day for days 1-4, pre-auth needed      SNF: $0/day for days 1-20; $160/day for days 21-62; $0/day for days 63-100 (100 days visit max, pre-auth needed) Outpatient: 100%     Co-Pay: $40 copay, no visit limits Home Health: 100%      Co-Pay: none DME: 80%     Co-Pay: 20% Providers: in network   Emergency Contact Information Contact Information    Name Relation Home Work Mobile   [place] Spouse [phone]     Smith,Sheila Daughter   [phone]   [place], Ahart   [phone]   Patrcia, Schnepp   [phone]     Current Medical History  Patient Admitting Diagnosis: critical illness myopathy History of Present Illness: Meagan Campbell is a 74 y.o. right handed female with history of severe OHS/BiPAP dependent, COPD on oxygen for many years, chronic kidney disease with baseline creatinine 7.91, CHF, diastolic congestive heart failure. Independent prior to admission using a walker living with her husband. Presented presented to [facility] 07/19/2014 with increased weight gain and shortness of breath. Treated with aggressive diuresis as well as findings of urinary tract infection placed on broad-spectrum antibiotics.  Developed increased shortness of breath 07/27/2014 with respiratory arrest and was intubated. Extubated 08/04/2014 but required reintubation and transferred to [facility] 08/05/2014 for ongoing care per critical care medicine. Hospital course MRSA PCR screening positive. Patient was extubated 08/09/2014 with careful O2 titration. Subcutaneous Lovenox for DVT prophylaxis. Remains on amiodarone for history of atrial fibrillation. Tolerating a regular diet. Physical therapy evaluation completed 08/10/2014. Patient noted still to be limited by endurance fatigue factors. Recommendations made for physical medicine rehabilitation consult.   Past Medical History  Past Medical History  Diagnosis Date  . Anemia   . Edema   . Intention tremor   . Lymphedema   . Atrial fibrillation   . Chronic respiratory failure   . Respiratory failure   . Congestive heart failure   . Insomnia   . Adenomatous colon polyp   . MRSA (methicillin resistant staph aureus) culture positive   . Hypertension   . Proteinuria   . Hyperlipidemia   . Restless legs   . Idiopathic peripheral neuropathy   . Urinary tract infection   . COPD (chronic obstructive pulmonary disease)   . On home oxygen therapy     "2L at night" (08/10/2014)  . PNA (pneumonia) 05/2013; 05/2014  . OSA on CPAP   . Type  II diabetes mellitus   . DDD (degenerative disc disease)   . Arthritis     "joints" (08/10/2014    Family History  family history includes Cancer in her brother; Cancer - Colon in her mother; Cirrhosis in her father; Diabetes in her sister; Hypertension in her sister.  Prior Rehab/Hospitalizations: pt has been at SNF about one year ago and had brief home health services after that.   Current Medications   Current facility-administered medications:  .  amiodarone (PACERONE) tablet 200 mg, 200 mg, Oral, Daily, Wilhelmina Mcardle, MD, 200 mg at 08/12/14 1025 .  aspirin tablet 325 mg, 325 mg, Oral, Daily, Brand Males, MD,  325 mg at 08/12/14 1025 .  atorvastatin (LIPITOR) tablet 20 mg, 20 mg, Oral, q1800, Rush Farmer, MD .  budesonide (PULMICORT) nebulizer solution 0.25 mg, 0.25 mg, Nebulization, 4 times per day, Wilhelmina Mcardle, MD, 0.25 mg at 08/12/14 1039 .  carbidopa-levodopa (SINEMET IR) 25-250 MG per tablet immediate release 1 tablet, 1 tablet, Oral, TID, Brand Males, MD, 1 tablet at 08/12/14 1025 .  enoxaparin (LOVENOX) injection 40 mg, 40 mg, Subcutaneous, Q24H, Wilhelmina Mcardle, MD, 40 mg at 08/12/14 1025 .  ferrous sulfate tablet 325 mg, 325 mg, Oral, Q breakfast, Brand Males, MD, 325 mg at 08/12/14 0800 .  furosemide (LASIX) tablet 80 mg, 80 mg, Oral, BID, Brand Males, MD, 80 mg at 08/12/14 0800 .  insulin aspart (novoLOG) injection 0-15 Units, 0-15 Units, Subcutaneous, TID WC, Wilhelmina Mcardle, MD, 5 Units at 08/12/14 1256 .  insulin aspart (novoLOG) injection 0-5 Units, 0-5 Units, Subcutaneous, QHS, Wilhelmina Mcardle, MD, 0 Units at 08/10/14 2225 .  insulin aspart (novoLOG) injection 4 Units, 4 Units, Subcutaneous, TID WC, Wilhelmina Mcardle, MD, 4 Units at 08/12/14 1255 .  ipratropium-albuterol (DUONEB) 0.5-2.5 (3) MG/3ML nebulizer solution 3 mL, 3 mL, Nebulization, Q6H, William S Minor, NP, 3 mL at 08/12/14 1039 .  isosorbide mononitrate (IMDUR) 24 hr tablet 30 mg, 30 mg, Oral, Daily, Brand Males, MD, 30 mg at 08/12/14 1025 .  levothyroxine (SYNTHROID, LEVOTHROID) tablet 75 mcg, 75 mcg, Oral, QAC breakfast, Wilhelmina Mcardle, MD, 75 mcg at 08/12/14 0800 .  ondansetron (ZOFRAN) injection 4 mg, 4 mg, Intravenous, Q8H PRN, Kara Mead V, MD, 4 mg at 08/09/14 0139 .  rOPINIRole (REQUIP) tablet 4 mg, 4 mg, Oral, QHS, Brand Males, MD, 4 mg at 08/11/14 2136 .  sodium chloride 0.9 % injection 10-40 mL, 10-40 mL, Intracatheter, Q12H, Rush Farmer, MD, 10 mL at 08/10/14 1011 .  sodium chloride 0.9 % injection 10-40 mL, 10-40 mL, Intracatheter, PRN, Rush Farmer, MD, 20 mL at 08/11/14  0559  Patients Current Diet: Diet heart healthy/carb modified  Precautions / Restrictions Precautions Precautions: Fall Restrictions Weight Bearing Restrictions: No   Prior Activity Level Limited Community (1-2x/wk): Pt got out a few times a week on errands with her husband (husband does the driving). She would ride the motorized carts in the store. Pt uses a rolling walker in the house and a rollator out in the community. She enjoys watching TV and likes to sit on her porch.   Home Assistive Devices / Equipment Home Assistive Devices/Equipment: CPAP, Walker (specify type), Wheelchair, Oxygen, CBG Meter, Eyeglasses, Bedside commode/3-in-1, Dentures (specify type), Shower chair with back, Hospital bed, Grab bars in shower, Grab bars around toilet, Hand-held shower hose Home Equipment: Environmental consultant - 2 wheels, Environmental consultant - 4 wheels, Wheelchair - manual, Bedside commode, Shower seat  Prior Functional Level Prior Function Level of Independence: Needs assistance Gait / Transfers Assistance Needed: Ambulates with RW in house, rollator outside. ADL's / Homemaking Assistance Needed: Husband would assist with bath.  Current Functional Level Cognition  Overall Cognitive Status: Within Functional Limits for tasks assessed Orientation Level: Oriented X4    Extremity Assessment (includes Sensation/Coordination)  Upper Extremity Assessment: Generalized weakness, RUE deficits/detail RUE Deficits / Details: Patient with premorbid shoulder limitations. During eval, pt with decreased shoulder A/PROM  Lower Extremity Assessment: Defer to PT evaluation    ADLs  Overall ADL's : Needs assistance/impaired Eating/Feeding: Set up, Sitting Grooming: Set up, Sitting Upper Body Bathing: Minimal assitance, Sitting Lower Body Bathing: Total assistance, Sit to/from stand, Cueing for safety Upper Body Dressing : Minimal assistance, Sitting Lower Body Dressing: Total assistance, Sit to/from stand, Cueing for  safety Toilet Transfer: +2 for physical assistance, Maximal assistance Toilet Transfer Details (indicate cue type and reason): using STEDY Toileting- Clothing Manipulation and Hygiene: Total assistance, Sit to/from stand, Cueing for safety Toileting - Clothing Manipulation Details (indicate cue type and reason): using STEDY Functional mobility during ADLs: Maximal assistance, +2 for physical assistance General ADL Comments: Patient with decreased oxygen support and on 4 L of 02, sats remained at least 98% during therapy session. Used STEDY for patient and therapist safety during transfers. Patient required cues for energy conservation techniques, including pursed lip breathing. Patient unable to reach BLEs for LB ADLs and stated her husband assisted her with LB ADLs for the past year at least. Educated patient on use of AE and patient stated she wants to be more independent with her self-care tasks.     Mobility  Overal bed mobility: Needs Assistance Bed Mobility: Supine to Sit Supine to sit: Mod assist, HOB elevated General bed mobility comments: Mod A to follow through with trunk elevation to sit EOB. Cues for use of rails. HOB elevated. increased effort    Transfers  Overall transfer level: Needs assistance Equipment used: Rolling walker (2 wheeled) Transfers: Sit to/from Stand, Stand Pivot Transfers Sit to Stand: Max assist, +2 physical assistance, +2 safety/equipment Stand pivot transfers: Mod assist, +2 physical assistance General transfer comment: Utilized stedy this session for sit to stand as patient unable to stand with use of RW. +2 max A to promote anterior translation and power up into full stand. Once weight shift up and over knee patient and to stand fully erect with +2 min A. Patient stood x4 with use of stedy to promote increased activity tolerance and repeatition.     Ambulation / Gait / Stairs / Wheelchair Mobility   not assessed, anticipate needs    Posture / Balance  Balance Overall balance assessment: Needs assistance Sitting-balance support: No upper extremity supported, Feet supported Sitting balance-Leahy Scale: Fair Standing balance support: Bilateral upper extremity supported, During functional activity Standing balance-Leahy Scale: Poor    Special needs/care consideration BiPAP/CPAP - uses CPAP at home CPM no Continuous Drip IV no  Dialysis no         Life Vest no  Oxygen - currently on 3L O2 by nasal cannula, wears 2L O2 at baseline Special Bed no Trach Size no  Wound Vac (area) no        Skin - bruised R thumb and hand                               Bowel mgmt: last BM on 08-11-14, + incontinence  Bladder mgmt: + urinary incontinence, pt reports using the [facility] with assistance (?) Diabetic mgmt - managed at home with insulin R UE PICC line present.   Previous Home Environment Living Arrangements: Spouse/significant other Available Help at Discharge: Family, Available 24 hours/day Type of Home: House Home Layout: One level Home Access: Ramped entrance Bathroom Shower/Tub: Walk-in shower, Charity fundraiser: [place]: No  Discharge Living Setting Plans for Discharge Living Setting: Patient's home Type of Home at Discharge: House Discharge Home Access: [place] entrance Discharge Bathroom Shower/Tub: Walk-in shower Discharge Bathroom Toilet: Standard Does the patient have any problems obtaining your medications?: No  Social/Family/Support Systems Patient Roles: Spouse (involved in her family) Sport and exercise psychologist Information: husband Shanon Brow is primary contact Anticipated Caregiver: husband Anticipated Caregiver's Contact Information: see above Ability/Limitations of Caregiver: no limitations from husband Caregiver Availability: 24/7 Discharge Plan Discussed with Primary Caregiver: Yes Is Caregiver In Agreement with Plan?: Yes Does Caregiver/Family have Issues with Lodging/Transportation while Pt is in Rehab?:  No  Goals/Additional Needs Patient/Family Goal for Rehab: Mod ind and supervision with PT/OT; NA for SLP Expected length of stay: 12-18 days Cultural Considerations: [employer] Dietary Needs: heart healthy Equipment Needs: to be determined Pt/Family Agrees to Admission and willing to participate: Yes (spoke with pt/husband on several occasions) Program Orientation Provided & Reviewed with Pt/Caregiver Including Roles  & Responsibilities: Yes   Decrease burden of Care through IP rehab admission: NA  Possible need for SNF placement upon discharge: not anticipated  Patient Condition: This patient's condition remains as documented in the consult dated 08-11-14, in which the Rehabilitation Physician determined and documented that the patient's condition is appropriate for intensive rehabilitative care in an inpatient rehabilitation facility. Will admit to inpatient rehab today.  Preadmission Screen Completed By:  Nanetta Batty, PT, 08/12/2014 1:21 PM ______________________________________________________________________   Discussed status with Dr. Naaman Plummer on 08-12-14 at 1321 and received telephone approval for admission today.  Admission Coordinator: Nanetta Batty, PT, time1321/Date 08-12-14

## 2014-08-12 NOTE — Clinical Social Work Note (Signed)
CSW received consult for SNF placement, but patient was approved for inpatient rehab and by insurance.  CSW to sign off, please reconsult if social work needs arise.  Jones Broom. Barnard, MSW, [place] 08/12/2014 2:14 PM

## 2014-08-13 ENCOUNTER — Inpatient Hospital Stay (HOSPITAL_COMMUNITY): Payer: Medicare Other | Admitting: Physical Therapy

## 2014-08-13 ENCOUNTER — Inpatient Hospital Stay (HOSPITAL_COMMUNITY): Payer: Medicare Other | Admitting: Occupational Therapy

## 2014-08-13 LAB — GLUCOSE, CAPILLARY
GLUCOSE-CAPILLARY: 182 mg/dL — AB (ref 70–99)
Glucose-Capillary: 136 mg/dL — ABNORMAL HIGH (ref 70–99)
Glucose-Capillary: 155 mg/dL — ABNORMAL HIGH (ref 70–99)
Glucose-Capillary: 165 mg/dL — ABNORMAL HIGH (ref 70–99)

## 2014-08-13 MED ORDER — ALPRAZOLAM 0.25 MG PO TABS: 0.2500 mg | ORAL_TABLET | Freq: Three times a day (TID) | ORAL | Status: DC | PRN

## 2014-08-13 NOTE — Evaluation (Signed)
Physical Therapy Assessment and Plan  Patient Details  Name: Meagan Campbell MRN: [id number] Date of Birth: 1940/08/16  PT Diagnosis: Difficulty walking, Dizziness and giddiness, Edema, Hemiplegia non-dominant, Impaired sensation, Muscle weakness and Pain in legs Rehab Potential: Good ELOS: 17-20 days   Today's Date: 08/13/2014 PT Individual Time: 1100-1200 Treatment Session 2: 1330-1400 PT Individual Time Calculation (min): 60 min   Treatment Session 2: 30 min  Problem List:  Patient Active Problem List   Diagnosis Date Noted  . 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 Acute respiratory failure with hypercapnia 08/05/2014  . Respiratory failure 08/05/2014  . Chronic pain 06/28/2014  . DDD (degenerative disc disease), lumbar 06/28/2014  . Diabetes mellitus type 2, uncontrolled 06/28/2014  . Nerve root pain 06/28/2014  . Adiposity 06/28/2014  . Arthritis of knee, degenerative 06/28/2014  . DM (diabetes mellitus), type 2 with neurological complications 24/58/0998  . Obesity hypoventilation syndrome 08/11/2013  . Morbid obesity 08/11/2013  . CKD (chronic kidney disease) stage 4, GFR 15-29 ml/min 08/10/2013  . Anemia   . OSA (obstructive sleep apnea)   . Atrial fibrillation   . Chronic respiratory failure   . DM type 2, uncontrolled, with renal complications   . DDD (degenerative disc disease)   . Chronic diastolic heart failure   . Idiopathic peripheral neuropathy   . Restless legs   . Hyperlipidemia   . Proteinuria   . MRSA (methicillin resistant staph aureus) culture positive   . Hypertension     Past Medical History:  Past Medical History  Diagnosis Date  . Anemia   . Edema   . Intention tremor   . Lymphedema   . Atrial fibrillation   . Chronic respiratory failure   . Respiratory failure   . Congestive heart failure   . Insomnia   . Adenomatous colon polyp   . MRSA (methicillin resistant staph aureus) culture positive   . Hypertension   . Proteinuria   .  Hyperlipidemia   . Restless legs   . Idiopathic peripheral neuropathy   . Urinary tract infection   . COPD (chronic obstructive pulmonary disease)   . On home oxygen therapy     "2L at night" (08/10/2014)  . PNA (pneumonia) 05/2013; 05/2014  . OSA on CPAP   . Type II diabetes mellitus   . DDD (degenerative disc disease)   . Arthritis     "joints" (08/10/2014   Past Surgical History:  Past Surgical History  Procedure Laterality Date  . Shoulder arthroscopy w/ rotator cuff repair Left   . Abdominal hysterectomy    . Laparoscopic cholecystectomy    . Knee arthroscopy Right   . Cataract extraction w/ intraocular lens  implant, bilateral      Assessment & Plan Clinical Impression: Meagan Campbell is a 75 y.o. right handed female with history of severe OHS/BiPAP dependent, COPD on oxygen for many years, chronic kidney disease with baseline creatinine 3.38, CHF, diastolic congestive heart failure, hypertension with atrial fibrillation. Independent prior to admission using a walker living with her husband. Presented presented to [facility] 07/19/2014 with increased weight gain and shortness of breath. Treated with aggressive diuresis as well as findings of urinary tract infection placed on broad-spectrum antibiotics. Developed increased shortness of breath 07/27/2014 with respiratory arrest and was intubated. Extubated 08/04/2014 but required reintubation and transferred to [facility] 08/05/2014 for ongoing care per critical care medicine. Hospital course MRSA PCR screening positive. Patient was extubated 08/09/2014 with careful O2 titration. Subcutaneous Lovenox for DVT  prophylaxis. Remains on amiodarone for history of atrial fibrillation. Echocardiogram remains pending. Tolerating a regular diet. Physical therapy evaluation completed 08/10/2014. Patient noted still to be limited by endurance fatigue factors. Recommendations made for physical medicine rehabilitation consult.  Patient  transferred to CIR on 08/12/2014 .   Patient currently requires total with mobility secondary to muscle weakness, decreased cardiorespiratoy endurance and decreased oxygen support and decreased sitting balance, decreased standing balance, decreased postural control, hemiplegia and decreased balance strategies.  Prior to hospitalization, patient was modified independent  with mobility and lived with Spouse in a House home.  Home access is  Ramped entrance.  Patient will benefit from skilled PT intervention to maximize safe functional mobility, minimize fall risk and decrease caregiver burden for planned discharge home with 24 hour supervision.  Anticipate patient will benefit from follow up [place] at discharge.  PT - End of Session Endurance Deficit: Yes  Skilled Therapeutic Intervention Treatment Session 1: PT Evaluation: PT completes evaluation and finds pt to currently be limited by fear/anxiety, significant swelling in B LEs, generalized deconditioning and unfit body habitus, as well as very limited activity tolerance with increased need for oxygen support. Pt is currently on 3 L O2/min at all times and desaturates to 92% SaO2 with minimal activity. Pt requires a few minutes rest break to return to SaO2 levels >= 96%. Pt req up to 2 person assist with sit to stands, gait, and stairs. Pt will benefit from IPR PT to maximize safety with functional mobility.   Therapeutic Activity: PT set up pt's hospital bed as pt and husband report it is set up at home. PT elevates HOB until pt reports this is how it is at home, bedrail left up as pt has bedrail at home, foot of bed flat as pt reports this is what she does prior to transferring out of bed. Pt req mod A for B LEs during sit to supine with HOB elevated with bedrail assist (PT assists at B legs). PT instructs pt in supine with HOB elevated and bedrail assist to sit transfer req min A, progressing to mod A with fatigue. PT instructs pt in sit to stand from  edge of bed req min-mod A with RW (3 attempts req before pt able to achieve full stand from self reported bed height at home) and min A for stand-step transfer to w/c.   Gait Training: PT instructs pt in sit to stand from w/c req +2 assist, then PT instructs pt in ambulation x 10' req min A from PT and 2nd assist for w/c follow for safety - slow speed and flat foot contact with pt demonstrating elevated B shoulders due to anxiety/fear.   Treatment Session 2: W/C Management: PT instructs pt in w/c propulsion with B UEs x 35' req SBA and verbal cues to push harder with R UE in order to straighten w/c out. Pt reports she can go no further than this and SaO2 measured to be 92% on 3 L/min after this activity. Pt req physical assist for legrest management, but verbal cues for brakes management.  Upon questioning, pt reports she was given a w/c from a hospital a year ago, but hasn't needed to use it at home, yet.   Gait Training: PT instructs pt in ascending/descneding one (3" height) stair with B rails req +2 assist to reduce pt's fear (min A x 2) and verbal cues for technique. Pt's SaO2 dropped to 93% on 3 L O2/min after this activity and pt req  a few minutes to recover to baseline levels.   Continue per PT POC.     PT Evaluation Precautions/Restrictions Precautions Precautions: Fall Precaution Comments: edema in bilateral LEs - large TED hose in place Restrictions Weight Bearing Restrictions: No General Chart Reviewed: Yes Family/Caregiver Present: Yes (Randy son, Shanon Brow husband) Vital SignsTherapy Vitals Pulse Rate: 77 BP: (!) 145/39 mmHg Patient Position (if appropriate): Sitting Oxygen Therapy SpO2: 99 % O2 Device: Nasal Cannula O2 Flow Rate (L/min): 3 L/min Pain Pain Assessment Pain Assessment: 0-10 Pain Score: 5  Pain Type: Acute pain Pain Location: Leg (from swelling) Pain Orientation: Right;Left Pain Descriptors / Indicators: Aching Pain Onset: Gradual Pain  Intervention(s): Emotional support Multiple Pain Sites: No Home Living/Prior Functioning Home Living Available Help at Discharge: Family;Available 24 hours/day Type of Home: House Home Access: Ramped entrance Home Layout: One level  Lives With: Spouse Prior Function Level of Independence: Requires assistive device for independence  Able to Take Stairs?: No Driving: No Vocation: Retired Oceanographer Comments: wfl  Cognition Overall Cognitive Status: Within Functional Limits for tasks assessed Arousal/Alertness: Awake/alert Orientation Level: Oriented X4 Attention: Focused;Sustained Focused Attention: Appears intact Sustained Attention: Appears intact Memory: Appears intact (recalls 3/4 new words given) Awareness: Appears intact Problem Solving: Appears intact Safety/Judgment: Appears intact Comments: Pt with increased anxiety about the rehab process.  Needs lots of encouragement and praise Sensation Sensation Light Touch: Impaired Detail Light Touch Impaired Details: Impaired RLE;Impaired LLE Stereognosis: Not tested Hot/Cold: Not tested Proprioception: Appears Intact Proprioception Impaired Details: Impaired LLE;Impaired RLE Additional Comments: partial numbness in B legs from peripheral neuropathy Coordination Gross Motor Movements are Fluid and Coordinated: Yes Fine Motor Movements are Fluid and Coordinated: Not tested Finger Nose Finger Test: intention tremor bilaterally, but wfl Heel Shin Test: limited by hip flexor weakness, but smooth motion and accurate direction Motor  Motor Motor: Abnormal tone Motor - Skilled Clinical Observations: intention tremors in hands  Mobility Bed Mobility Bed Mobility: Supine to Sit;Sit to Supine;Scooting to [facility] Supine to Sit: With rails;HOB elevated;3: Mod assist Supine to Sit Details: Manual facilitation for placement Sit to Supine: HOB elevated;With rail;3: Mod assist Sit to Supine - Details: Manual  facilitation for placement Scooting to HOB: 4: Min assist;Other (comment) (bed trendellenburged) Scooting to [facility] Details: Manual facilitation for placement Transfers Transfers: Yes Sit to Stand: With upper extremity assist;1: +2 Total assist Sit to Stand Details: Manual facilitation for placement Sit to Stand Details (indicate cue type and reason): due to fear, pt req +2 assist to stand Stand to Sit: 4: Min guard Stand to Sit Details (indicate cue type and reason): Manual facilitation for placement Stand Pivot Transfers: 4: Min assist Stand Pivot Transfer Details: Manual facilitation for placement Stand Pivot Transfer Details (indicate cue type and reason): with RW Locomotion  Ambulation Ambulation: Yes Ambulation/Gait Assistance: 1: +2 Total assist Ambulation Distance (Feet): 10 Feet Assistive device: Rolling walker Ambulation/Gait Assistance Details: Manual facilitation for placement Gait Gait: Yes Gait Pattern: Impaired Gait Pattern: Left foot flat;Right foot flat Gait velocity: slow Stairs / Additional Locomotion Stairs: Yes Stairs Assistance: 1: +2 Total assist Stairs Assistance Details: Manual facilitation for placement Stair Management Technique: Two rails Number of Stairs: 1 Height of Stairs: 3 Wheelchair Mobility Wheelchair Mobility: Yes Wheelchair Assistance: 5: Investment banker, operational Details: Verbal cues for Marketing executive: Both upper extremities Wheelchair Parts Management: Needs assistance Distance: 35  Trunk/Postural Assessment  Cervical Assessment Cervical Assessment: Exceptions to [facility] Cervical AROM Overall Cervical AROM: Deficits;Due to premorid status Overall  Cervical AROM Comments: forward head Thoracic Assessment Thoracic Assessment: Exceptions to [facility] Thoracic AROM Overall Thoracic AROM: Deficits;Due to premorid status Overall Thoracic AROM Comments: rounded shoulders Lumbar Assessment Lumbar Assessment: Exceptions to  [facility] Lumbar AROM Overall Lumbar AROM: Deficits;Due to premorid status Overall Lumbar AROM Comments: posterior pelvic tilt Postural Control Postural Control: Deficits on evaluation Trunk Control: posterior lean in sit  Postural Limitations: rigid spine in sit/stand - likely due to fear  Balance Balance Balance Assessed: Yes Static Sitting Balance Static Sitting - Balance Support: Bilateral upper extremity supported;Feet supported Static Sitting - Level of Assistance: 5: Stand by assistance Dynamic Sitting Balance Dynamic Sitting - Balance Support: Bilateral upper extremity supported;Feet supported Dynamic Sitting - Level of Assistance: 4: Min Insurance risk surveyor Standing - Balance Support: Bilateral upper extremity supported;During functional activity Static Standing - Level of Assistance: 4: Min assist Dynamic Standing Balance Dynamic Standing - Balance Support: Bilateral upper extremity supported;During functional activity Dynamic Standing - Level of Assistance: 4: Min assist Extremity Assessment  RUE Assessment RUE Assessment: Exceptions to [facility] RUE AROM (degrees) Overall AROM Right Upper Extremity: Deficits;Due to premorbid status RUE Overall AROM Comments: armflexion minimally limited; elbow & grip wfl RUE Strength RUE Overall Strength: Deficits;Due to premorbid status RUE Overall Strength Comments: grossly 3+/5 LUE Assessment LUE Assessment: Exceptions to WFL LUE AROM (degrees) Overall AROM Left Upper Extremity: Deficits;Due to premorbid status LUE Overall AROM Comments: arm flexion minimally limited; elbow & grip wfl LUE Strength LUE Overall Strength: Deficits;Due to premorbid status LUE Overall Strength Comments: grossly 4/5 RLE Assessment RLE Assessment: Exceptions to [facility] RLE AROM (degrees) Overall AROM Right Lower Extremity: Deficits;Due to premorbid status;Due to decreased strength RLE Overall AROM Comments: hip flexion limited by weakness, knee  flexion limited by thigh mass, ankle DF limited by weakness RLE Strength RLE Overall Strength: Deficits;Due to premorbid status RLE Overall Strength Comments: hip flexion 2+/5, knee grossly 3+/5, ankle DF 3-/5 LLE Assessment LLE Assessment: Exceptions to WFL LLE AROM (degrees) Overall AROM Left Lower Extremity: Deficits;Due to decreased strength;Due to premorbid status LLE Overall AROM Comments: hip flexion limited by muscle weakness, knee flexion limited by thigh mass, ankle wfl LLE Strength LLE Overall Strength: Deficits;Due to premorbid status LLE Overall Strength Comments: hip flexion 3-/5, knee grossly 4/5, ankle DF 3+/5  FIM:  FIM - Bed/Chair Transfer Bed/Chair Transfer: 3: Supine > Sit: Mod A (lifting assist/Pt. 50-74%/lift 2 legs;3: Bed > Chair or W/C: Mod A (lift or lower assist) FIM - Locomotion: Ambulation Ambulation/Gait Assistance: 1: +2 Total assist   Refer to Care Plan for Long Term Goals  Recommendations for other services: None  Discharge Criteria: Patient will be discharged from PT if patient refuses treatment 3 consecutive times without medical reason, if treatment goals not met, if there is a change in medical status, if patient makes no progress towards goals or if patient is discharged from hospital.  The above assessment, treatment plan, treatment alternatives and goals were discussed and mutually agreed upon: by patient and by family  [facility] M 08/13/2014, 1:00 PM

## 2014-08-13 NOTE — Progress Notes (Signed)
Meagan Campbell PHYSICAL MEDICINE & REHABILITATION     PROGRESS NOTE    Subjective/Complaints: Anxious about therapies. Anxious overnight. Breathing is good. Denies pain. Has good appetite  Objective: Vital Signs: Blood pressure 125/86, pulse 83, temperature 98.8 F (37.1 C), temperature source Oral, resp. rate 18, weight 142.792 kg (314 lb 12.8 oz), SpO2 93 %. No results found.  Recent Labs  08/12/14 1515  WBC 6.7  HGB 10.3*  HCT 34.8*  PLT 212    Recent Labs  08/12/14 1515  NA 144  K 3.9  CL 102  GLUCOSE 198*  BUN 20  CREATININE 1.65*  CALCIUM 9.2   CBG (last 3)   Recent Labs  08/12/14 1648 08/12/14 2127 08/13/14 0655  GLUCAP 159* 135* 136*    Wt Readings from Last 3 Encounters:  08/13/14 142.792 kg (314 lb 12.8 oz)  08/11/14 127.007 kg (280 lb)  06/28/14 130.636 kg (288 lb)    Physical Exam:  Constitutional: Meagan Campbell is oriented to person, place, and time.  74 year old obese female in no distress HENT: oral mucosa moist, pink Head: Normocephalic.  Eyes: EOM are normal.  Neck: Normal range of motion. Neck supple. No thyromegaly present.  Cardiovascular:  Cardiac rate controlled without murmur Respiratory: no dyspnea, no wheezes or rales Lungs decreased breath sounds at the bases with good inspiratory effort--otherwise clear GI: Soft. Bowel sounds are normal. Meagan Campbell exhibits no distension.  Musculoskeletal:  +1 edema lower extremities.  Neurological: Meagan Campbell is alert and oriented to person, place, and time. A cranial nerve deficit is present. Meagan Campbell exhibits normal muscle tone. Mild tremor MMT-- Right: 3+/5 deltoid, 4-/5 bicep, 4-/5 tricep, 4/5 wrist extension, 4+/5 hand intrinsics. 1+/5 hip flexor, 2/5 knee extension, 3/5 ankle dorsiflexion, 3/5 ankle plantarflexion. Left: 3+/5 deltoid, 4-/5 bicep, 4-/5 tricep, 4/5 wrist extension, 4+/5 hand intrinsics. 1+/5 hip flexor, 2/5 knee extension, 3/5 ankle dorsiflexion, 3/5 ankle plantarflexion. No sensory  deficits Skin: Skin is warm and dry. Patient sitting up and share with [place] buttocks was not examined  Psychiatric: generally pleasant and cooperative. Appears anxious  Assessment/Plan: 1. Functional deficits secondary to critical illness myopathy which require 3+ hours per day of interdisciplinary therapy in a comprehensive inpatient rehab setting. Physiatrist is providing close team supervision and 24 hour management of active medical problems listed below. Physiatrist and rehab team continue to assess barriers to discharge/monitor patient progress toward functional and medical goals. FIM:                   Comprehension Comprehension Mode: Auditory  Expression Expression Mode: Verbal  Social Interaction Social Interaction: 5-Interacts appropriately 90% of the time - Needs monitoring or encouragement for participation or interaction.     Memory Memory: 5-Recognizes or recalls 90% of the time/requires cueing < 10% of the time  Medical Problem List and Plan: 1. 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 2. DVT Prophylaxis/Anticoagulation: Lovenox.Monitor for any bleeding episodes 3. Pain Management: Tylenol as needed 4. COPD/OSA. continue nebulizers. Titrate oxygen. Followed by Dr. Joya Gaskins 5. Neuropsych: This patient is capable of making decisions on her own behalf.  -prn xanax for anxiety 6. Skin/Wound Care: Routine skin checks/need to check for any pressure ulcers 7. Fluids/Electrolytes/Nutrition: Strict I&O.Follow up labs 8. Chronic renal insufficiency. Baseline creatinine 2.67.   9. Diastolic congestive heart failure. Lasix 80 mg twice a day. Monitor for any signs of fluid overload. 10. Hypertension/atrial fibrillation. Amiodarone 200 mg daily, Imdur 30 mg daily. Coreg recently held. Cardiac rate control. Monitor  with increased mobility. Echocardiogram remains pending from 08/11/2014 11. Hypothyroidism. Synthroid 12.  Diabetes mellitus with peripheral neuropathy. Hemoglobin A1c 6.3.  Sliding scale insulin. Patient on 70/30 insulin 60 units twice a day prior to admission. Resume as tolerated 13. MRSA PCR screening positive. Contact precautions 14. Hyperlipidemia. Lipitor 15. RLS. Sinemet and Requip. LOS (Days) 1 A FACE TO FACE EVALUATION WAS PERFORMED  SWARTZ,ZACHARY T 08/13/2014 8:41 AM

## 2014-08-13 NOTE — Evaluation (Addendum)
Occupational Therapy Assessment and Plan  Patient Details  Name: Meagan Campbell MRN: [id number] Date of Birth: June 26, 1940  OT Diagnosis: muscle weakness (generalized) Rehab Potential: Rehab Potential (ACUTE ONLY): Good ELOS14- 17days   Today's Date: 08/13/2014 OT Individual Time: 0900-1000 OT Individual Time Calculation (min): 60 min     Problem List:  Patient Active Problem List   Diagnosis Date Noted  . 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 Acute respiratory failure with hypercapnia 08/05/2014  . Respiratory failure 08/05/2014  . Chronic pain 06/28/2014  . DDD (degenerative disc disease), lumbar 06/28/2014  . Diabetes mellitus type 2, uncontrolled 06/28/2014  . Nerve root pain 06/28/2014  . Adiposity 06/28/2014  . Arthritis of knee, degenerative 06/28/2014  . DM (diabetes mellitus), type 2 with neurological complications 42/59/5638  . Obesity hypoventilation syndrome 08/11/2013  . Morbid obesity 08/11/2013  . CKD (chronic kidney disease) stage 4, GFR 15-29 ml/min 08/10/2013  . Anemia   . OSA (obstructive sleep apnea)   . Atrial fibrillation   . Chronic respiratory failure   . DM type 2, uncontrolled, with renal complications   . DDD (degenerative disc disease)   . Chronic diastolic heart failure   . Idiopathic peripheral neuropathy   . Restless legs   . Hyperlipidemia   . Proteinuria   . MRSA (methicillin resistant staph aureus) culture positive   . Hypertension     Past Medical History:  Past Medical History  Diagnosis Date  . Anemia   . Edema   . Intention tremor   . Lymphedema   . Atrial fibrillation   . Chronic respiratory failure   . Respiratory failure   . Congestive heart failure   . Insomnia   . Adenomatous colon polyp   . MRSA (methicillin resistant staph aureus) culture positive   . Hypertension   . Proteinuria   . Hyperlipidemia   . Restless legs   . Idiopathic peripheral neuropathy   . Urinary tract infection   . COPD (chronic  obstructive pulmonary disease)   . On home oxygen therapy     "2L at night" (08/10/2014)  . PNA (pneumonia) 05/2013; 05/2014  . OSA on CPAP   . Type II diabetes mellitus   . DDD (degenerative disc disease)   . Arthritis     "joints" (08/10/2014   Past Surgical History:  Past Surgical History  Procedure Laterality Date  . Shoulder arthroscopy w/ rotator cuff repair Left   . Abdominal hysterectomy    . Laparoscopic cholecystectomy    . Knee arthroscopy Right   . Cataract extraction w/ intraocular lens  implant, bilateral      Assessment & Plan Clinical Impression: Patient is a 74 y.o. year old right handed female with history of severe OHS/BiPAP dependent, COPD on oxygen for many years, chronic kidney disease with baseline creatinine 7.56, CHF, diastolic congestive heart failure, hypertension with atrial fibrillation. Independent prior to admission using a walker living with her husband. Presented [facility] to [facility]2014 with increased weight gain and shortness of breath. Treated with aggressive diuresis as well as findings of urinary tract infection placed on broad-spectrum antibiotics. Developed increased shortness of breath 07/27/2014 with respiratory arrest and was intubated. Extubated 08/04/2014 but required reintubation and transferred to [facility] 08/05/2014 for ongoing care per critical care medicine. Hospital course MRSA PCR screening positive. Patient was extubated 08/09/2014 with careful O2 titration. Subcutaneous Lovenox for DVT prophylaxis. Remains on amiodarone for history of atrial fibrillation. Echocardiogram remains pending. Tolerating a regular diet.  Patient transferred to CIR on 08/12/2014 .    Patient currently requires max with basic self-care skills and mod A for basic mobility secondary to muscle weakness, decreased cardiorespiratoy endurance and decreased oxygen support, anxiety and decreased standing balance, decreased postural control and  decreased balance strategies.  Prior to hospitalization, patient could complete ADL with supervision to mod I .  Patient will benefit from skilled intervention to decrease level of assist with basic self-care skills and increase independence with basic self-care skills prior to discharge home with care partner.  Anticipate patient will require intermittent supervision and minimal physical assistance and follow up home health.  OT - End of Session Activity Tolerance: Tolerates < 10 min activity, no significant change in vital signs Endurance Deficit: Yes OT Assessment Rehab Potential (ACUTE ONLY): Good OT Patient demonstrates impairments in the following area(s): Balance;Safety;Skin Integrity;Edema;Endurance;Motor;Nutrition;Pain;Perception OT Basic ADL's Functional Problem(s): Grooming;Bathing;Dressing;Toileting OT Transfers Functional Problem(s): Toilet;Tub/Shower OT Plan OT Intensity: Minimum of 1-2 x/day, 45 to 90 minutes OT Frequency: 5 out of 7 days OT Duration/Estimated Length of Stay: 12-14 days OT Treatment/Interventions: Balance/vestibular training;Discharge planning;Pain management;Self Care/advanced ADL retraining;Therapeutic Activities;UE/LE Coordination activities;Therapeutic Exercise;Skin care/wound managment;Patient/family education;Functional mobility training;Community reintegration;DME/adaptive equipment instruction;Neuromuscular re-education;UE/LE Strength taining/ROM OT Self Feeding Anticipated Outcome(s): no goal OT Basic Self-Care Anticipated Outcome(s): supervision OT Toileting Anticipated Outcome(s): supervision OT Bathroom Transfers Anticipated Outcome(s): supervision OT Recommendation Patient destination: Home Follow Up Recommendations: Home health OT Equipment Recommended: To be determined   Skilled Therapeutic Intervention 1:1 Ot eval initiated with pt and pt's husband. OT purpose, role and goals discussed. Self care retraining at EOB with focus on bed mobility,  sit to stand from slightly elevated bed surface with RW for UE support, standing tolerance and balance with bilateral UE support for peri hygiene and LB clothing management. Pt very anxious about therapy and the therapy process but with encouragement able to continue with muliple rest breaks. With first sit to stand and return to sitting pt's o2 sats went down to 80% but able to recover quickly on 3 liters of O2. Transferred into w/c with RW with mod A with extra time. Pt left in w/c with husband present.    OT Evaluation Precautions/Restrictions  Precautions Precautions: Fall Precaution Comments: edema in bilateral LEs Restrictions Weight Bearing Restrictions: No General Chart Reviewed: Yes Family/Caregiver Present: Yes (husband) Vital Signs Oxygen Therapy SpO2: 93 % O2 Device: Nasal Cannula O2 Flow Rate (L/min): 3 L/min Pain Pain Assessment Pain Assessment: No/denies pain Home Living/Prior Functioning Home Living Family/patient expects to be discharged to:: Private residence Living Arrangements: Spouse/significant other Available Help at Discharge: Family, Available 24 hours/day Type of Home: House Home Access: Ramped entrance Home Layout: One level  Lives With: Spouse ADL ADL ADL Comments: see FIM Vision/Perception  Vision- History Baseline Vision/History: Wears glasses Wears Glasses: At all times Patient Visual Report: No change from baseline Vision- Assessment Vision Assessment?: No apparent visual deficits  Cognition Overall Cognitive Status: Within Functional Limits for tasks assessed Arousal/Alertness: Awake/alert Orientation Level: Oriented X4 Attention: Sustained Comments: Pt with increased anxiety about the rehab process.  Needs lots of encouragement and praise Sensation Sensation Light Touch: Appears Intact Hot/Cold: Appears Intact Proprioception: Impaired Detail Proprioception Impaired Details: Impaired LLE;Impaired RLE Coordination Fine Motor  Movements are Fluid and Coordinated: Yes Motor  Motor Motor - Skilled Clinical Observations: generalized weakness Mobility  Bed Mobility Bed Mobility: Supine to Sit Supine to Sit: 3: Mod assist Transfers Transfers: Sit to Stand;Stand to Sit Sit to Stand: 3: Mod assist Stand  to Sit: 3: Mod assist;4: Min assist  Trunk/Postural Assessment  Cervical Assessment Cervical Assessment: Exceptions to [facility] (forward flexed) Thoracic Assessment Thoracic Assessment: Exceptions to [facility] (rounded thoracic region) Lumbar Assessment Lumbar Assessment:  (posterior pelvic tilt) Postural Control Postural Control:  (fear of leaning and weight shifting forward)  Balance Balance Balance Assessed: Yes Static Sitting Balance Static Sitting - Level of Assistance: 5: Stand by assistance Dynamic Sitting Balance Dynamic Sitting - Level of Assistance: 5: Stand by assistance;4: Min assist Static Standing Balance Static Standing - Balance Support: Bilateral upper extremity supported;During functional activity Static Standing - Level of Assistance: 4: Min assist Dynamic Standing Balance Dynamic Standing - Balance Support: During functional activity;Bilateral upper extremity supported Dynamic Standing - Level of Assistance: 4: Min assist Extremity/Trunk Assessment RUE Assessment RUE Assessment:  (4-/5) LUE Assessment LUE Assessment: Within Functional Limits (4-/5)  FIM:  FIM - Grooming Grooming Steps: Wash, rinse, dry face;Wash, rinse, dry hands;Oral care, brush teeth, clean dentures;Brush, comb hair Grooming: 4: Patient completes 3 of 4 or 4 of 5 steps FIM - Bathing Bathing Steps Patient Completed: Chest;Right Arm;Left Arm;Abdomen Bathing: 2: Max-Patient completes 3-4 [id number] parts or 25-49% FIM - Upper Body Dressing/Undressing Upper body dressing/undressing steps patient completed: Thread/unthread right sleeve of pullover shirt/dresss;Thread/unthread left sleeve of pullover shirt/dress;Put head through  opening of pull over shirt/dress Upper body dressing/undressing: 4: Min-Patient completed 75 plus % of tasks FIM - Lower Body Dressing/Undressing Lower body dressing/undressing: 1: Total-Patient completed less than 25% of tasks FIM - Bed/Chair Transfer Bed/Chair Transfer: 3: Supine > Sit: Mod A (lifting assist/Pt. 50-74%/lift 2 legs;3: Bed > Chair or W/C: Mod A (lift or lower assist)   Refer to Care Plan for Long Term Goals  Recommendations for other services: None  Discharge Criteria: Patient will be discharged from OT if patient refuses treatment 3 consecutive times without medical reason, if treatment goals not met, if there is a change in medical status, if patient makes no progress towards goals or if patient is discharged from hospital.  The above assessment, treatment plan, treatment alternatives and goals were discussed and mutually agreed upon: by patient  SNicoletta Ba3/19/2016, 10:32 AM

## 2014-08-14 ENCOUNTER — Inpatient Hospital Stay (HOSPITAL_COMMUNITY): Payer: Medicare Other | Admitting: Physical Therapy

## 2014-08-14 LAB — URINALYSIS, ROUTINE W REFLEX MICROSCOPIC
Bilirubin Urine: NEGATIVE
Glucose, UA: NEGATIVE mg/dL
HGB URINE DIPSTICK: NEGATIVE
KETONES UR: NEGATIVE mg/dL
Leukocytes, UA: NEGATIVE
Nitrite: NEGATIVE
Protein, ur: NEGATIVE mg/dL
Specific Gravity, Urine: 1.013 (ref 1.005–1.030)
UROBILINOGEN UA: 0.2 mg/dL (ref 0.0–1.0)
pH: 7 (ref 5.0–8.0)

## 2014-08-14 LAB — GLUCOSE, CAPILLARY
GLUCOSE-CAPILLARY: 161 mg/dL — AB (ref 70–99)
GLUCOSE-CAPILLARY: 140 mg/dL — AB (ref 70–99)
Glucose-Capillary: 140 mg/dL — ABNORMAL HIGH (ref 70–99)
Glucose-Capillary: 205 mg/dL — ABNORMAL HIGH (ref 70–99)

## 2014-08-14 MED ORDER — ALTEPLASE 2 MG IJ SOLR
2.0000 mg | Freq: Once | INTRAMUSCULAR | Status: AC
  Administered 2014-08-14: 2 mg
  Filled 2014-08-14: qty 2

## 2014-08-14 MED ORDER — PANTOPRAZOLE SODIUM 40 MG PO TBEC
40.0000 mg | DELAYED_RELEASE_TABLET | Freq: Every day | ORAL | Status: DC
  Administered 2014-08-14 – 2014-08-19 (×6): 40 mg via ORAL
  Filled 2014-08-14 (×5): qty 1

## 2014-08-14 MED ORDER — PROCHLORPERAZINE MALEATE 5 MG PO TABS
5.0000 mg | ORAL_TABLET | Freq: Three times a day (TID) | ORAL | Status: DC | PRN
  Administered 2014-08-14: 5 mg via ORAL
  Filled 2014-08-14 (×2): qty 1

## 2014-08-14 MED ORDER — IPRATROPIUM-ALBUTEROL 0.5-2.5 (3) MG/3ML IN SOLN
3.0000 mL | Freq: Two times a day (BID) | RESPIRATORY_TRACT | Status: DC
  Administered 2014-08-15 – 2014-08-19 (×8): 3 mL via RESPIRATORY_TRACT
  Filled 2014-08-14 (×9): qty 3

## 2014-08-14 NOTE — Progress Notes (Signed)
Physical Therapy Session Note  Patient Details  Name: Meagan Campbell MRN: [id number] Date of Birth: December 31, 1940  Today's Date: 08/14/2014 PT Individual Time: 0800-0900 PT Individual Time Calculation (min): 60 min   Short Term Goals: Week 1:  PT Short Term Goal 1 (Week 1): Pt will consistently sit to stand with 1 person assist and RW.  PT Short Term Goal 2 (Week 1): Pt will ambulate 64' with RW and 2nd assist for equipment management.  PT Short Term Goal 3 (Week 1): Pt will demonstrate bed mobility with HOB slightly elevated and bedrail (as at home) req min A.  PT Short Term Goal 4 (Week 1): Pt will self propel manual w/c >= 50' with B UEs req SBA.  PT Short Term Goal 5 (Week 1): Pt will tolerate 2 minutes of standing during activity without SaO2 dropping less than 90%.   Skilled Therapeutic Interventions/Progress Updates:    Pt hesitant to perform PT initially secondary to pain and nausea, but amenable with education. No increase in symptoms with activity. Pt self-limiting at times benefitting from gradual progression of activities and education to promote increased mobility. Pt would continue to benefit from skilled PT services to increase functional mobility.   Therapy Documentation Precautions:  Precautions Precautions: Fall Precaution Comments: edema in bilateral LEs - large TED hose in place Restrictions Weight Bearing Restrictions: No Vital Signs: Therapy Vitals Temp: 99.4 F (37.4 C) Temp Source: Oral Pulse Rate: 79 Resp: 20 BP: (!) 132/24 mmHg Patient Position (if appropriate): Lying Oxygen Therapy SpO2: 97 % O2 Device: Nasal Cannula O2 Flow Rate (L/min): 3 L/min Pain: Pain Assessment Pain Assessment: 0-10 Pain Score: 6  Pain Type: Acute pain Pain Location: Rib cage Pain Orientation: Right Pain Descriptors / Indicators: Aching Pain Onset: Sudden Pain Intervention(s):  (graded activity, manual techniques) Multiple Pain Sites: No Mobility:  Pt is Mod A for bed  mobility and transfers with cues for weight shift and technique Locomotion : Ambulation Ambulation/Gait Assistance: 3: Mod assist 3'x2 with cues for pacing, posture, and weight shift  Other Treatments:  Pt performs quad sets, glute sets, heel slides, ankle pumps, LAQs hip add isometrics, anterior weight shifts, hip ER AROM, hip abd AROM 2x10. Pt educated on rehab plan, OOB importance, and safety in mobility. Pt paced thoughout session with multiple and frequent rest breaks. Quadratus lumborum releases B/L. Pt performs unsupported sitting for active rest.    See FIM for current functional status  Therapy/Group: Individual Therapy  Monia Pouch 08/14/2014, 8:25 AM

## 2014-08-14 NOTE — Progress Notes (Addendum)
Occupational Therapy Session Note  Patient Details  Name: Meagan Campbell MRN: [id number] Date of Birth: 1940-10-29  Today's Date: 08/13/2014 OT Individual Time:  - 1430-1500 (66min) late entry      Short Term Goals: Week 1:  OT Short Term Goal 1 (Week 1): Pt will transfer to toilet/ BSC with min A  OT Short Term Goal 2 (Week 1): Pt will perform sit to stand from w/c with min A in prep for clothing management OT Short Term Goal 3 (Week 1): Pt will perform bed mobility to come to EOB with min A with extra time OT Short Term Goal 4 (Week 1): Pt will thread LB clothing with min  A  Skilled Therapeutic Interventions/Progress Updates:    1:1 Pt had just returned to w/c from the [facility]. Based on pt report pt required A with all 3 tasks of toileting. PT with increased fatigue this pm but declined getting into the bed. Pt with increased edema in bilateral Les- encouraged by dependent positioning; recommending transferring into recliner to prop LEs up. Pt able to perform functional ambulation short distance- from w/c to the recliner towards the foot of the bed with RW. Pt required mod A to perform sit to stand and min A to maneuver the RW to the chair. Encouraged ankle pumps during breaks on the TV to decrease ankle edema. Left in recliner with call bell.  Therapy Documentation Precautions:  Precautions Precautions: Fall Precaution Comments: edema in bilateral LEs - large TED hose in place Restrictions Weight Bearing Restrictions: No Pain: No c/o pain in session  ADL: ADL ADL Comments: see FIM  See FIM for current functional status  Therapy/Group: Individual Therapy  Willeen Cass [facility] 08/14/2014, 8:52 AM

## 2014-08-14 NOTE — IPOC Note (Signed)
Overall Plan of Care [facility]) Patient Details Name: Meagan Campbell MRN: [id number] DOB: 1940-06-15  Admitting Diagnosis: critical illness myopathy  Hospital Problems: Principal Problem:   Critical illness myopathy Active Problems:   DM type 2, uncontrolled, with renal complications   Chronic diastolic heart failure   Morbid obesity     Functional Problem List: Nursing Bladder, Edema, Endurance, Medication Management, Safety, Skin Integrity  PT Balance, Edema, Endurance, Motor, Pain, Safety, Sensory  OT Balance, Safety, Skin Integrity, Edema, Endurance, Motor, Nutrition, Pain, Perception  SLP    TR         Basic ADL's: OT Grooming, Bathing, Dressing, Toileting     Advanced  ADL's: OT       Transfers: PT Bed Mobility, Bed to Chair, Car, Manufacturing systems engineer, Metallurgist: PT Ambulation, Emergency planning/management officer, Stairs (stairs for strengthening only)     Additional Impairments: OT    SLP        TR      Anticipated Outcomes Item Anticipated Outcome  Self Feeding no goal  Swallowing      Basic self-care  supervision  Toileting  supervision   Bathroom Transfers supervision  Bowel/Bladder  Mod I  Transfers  Mod I  Locomotion  supervision ambulation with assist for oxygen management  Communication     Cognition     Pain  N/A  Safety/Judgment  Mod I   Therapy Plan: PT Intensity: Minimum of 1-2 x/day ,45 to 90 minutes PT Frequency: 5 out of 7 days PT Duration Estimated Length of Stay: 14-17 days OT Intensity: Minimum of 1-2 x/day, 45 to 90 minutes OT Frequency: 5 out of 7 days OT Duration/Estimated Length of Stay: 14-17 days         Team Interventions: Nursing Interventions Patient/Family Education, Bladder Management, Disease Management/Prevention, Medication Management, Skin Care/Wound Management, Discharge Planning  PT interventions Ambulation/gait training, DME/adaptive equipment instruction, Psychosocial support, UE/LE Strength  taining/ROM, Balance/vestibular training, Skin care/wound management, UE/LE Coordination activities, Functional mobility training, Splinting/orthotics, Community reintegration, Neuromuscular re-education, IT trainer, Wheelchair propulsion/positioning, Discharge planning, Pain management, Therapeutic Activities, Disease management/prevention, Patient/family education, Therapeutic Exercise  OT Interventions Training and development officer, Discharge planning, Pain management, Self Care/advanced ADL retraining, Therapeutic Activities, UE/LE Coordination activities, Therapeutic Exercise, Skin care/wound managment, Patient/family education, Functional mobility training, Community reintegration, Engineer, drilling, Neuromuscular re-education, UE/LE Strength taining/ROM  SLP Interventions    TR Interventions    SW/CM Interventions      Team Discharge Planning: Destination: PT-Home ,OT- Home , SLP-  Projected Follow-up: PT-Home health PT, OT-  Home health OT, SLP-  Projected Equipment Needs: PT-To be determined, OT- To be determined, SLP-  Equipment Details: PT-Pt already owns RW, 4WW, BSC, shower seat, and manual w/c, OT-  Patient/family involved in discharge planning: PT- Patient, Family member/caregiver,  OT-Patient, SLP-   MD ELOS: 14-17 days Medical Rehab Prognosis:  Good Assessment: The patient has been admitted for CIR therapies with the diagnosis of critical illness myopathy. The team will be addressing functional mobility, strength, stamina, balance, safety, adaptive techniques and equipment, self-care, bowel and bladder mgt, patient and caregiver education, pain mgt, activity tolerance, family education. Goals have been set at supervision to mod I for mobiltiy and self-care.    Meredith Staggers, MD, FAAPMR      See Team Conference Notes for weekly updates to the plan of care

## 2014-08-14 NOTE — Progress Notes (Addendum)
[facility] PHYSICAL MEDICINE & REHABILITATION     PROGRESS NOTE    Subjective/Complaints: Nauseas since yesterday evening. Never vomited. Having pain along right ribs--tender to touch/move. Had BM's yesterday  Objective: Vital Signs: Blood pressure 130/54, pulse 82, temperature 99.4 F (37.4 C), temperature source Oral, resp. rate 20, weight 141.568 kg (312 lb 1.6 oz), SpO2 94 %. No results found.  Recent Labs  08/12/14 1515  WBC 6.7  HGB 10.3*  HCT 34.8*  PLT 212    Recent Labs  08/12/14 1515  NA 144  K 3.9  CL 102  GLUCOSE 198*  BUN 20  CREATININE 1.65*  CALCIUM 9.2   CBG (last 3)   Recent Labs  08/13/14 1635 08/13/14 2111 08/14/14 0650  GLUCAP 182* 165* 161*    Wt Readings from Last 3 Encounters:  08/14/14 141.568 kg (312 lb 1.6 oz)  08/11/14 127.007 kg (280 lb)  06/28/14 130.636 kg (288 lb)    Physical Exam:  Constitutional: She is oriented to person, place, and time.  74 year old obese female in no distress HENT: oral mucosa moist, pink Head: Normocephalic.  Eyes: EOM are normal.  Neck: Normal range of motion. Neck supple. No thyromegaly present.  Cardiovascular:  Cardiac rate controlled without murmur Respiratory: no dyspnea, no wheezes or rales Lungs decreased breath sounds at the bases with good inspiratory effort--otherwise clear GI: Soft. Bowel sounds are normal. She exhibits no distension. Abdomen non tender Musculoskeletal:  +1 edema lower extremities. pain withpalpation along right axillary line/ribs. No bruising or swelling appreciated Neurological: She is alert and oriented to person, place, and time. A cranial nerve deficit is present. She exhibits normal muscle tone. Mild tremor MMT-- Right: 3+/5 deltoid, 4-/5 bicep, 4-/5 tricep, 4/5 wrist extension, 4+/5 hand intrinsics. 1+/5 hip flexor, 2/5 knee extension, 3/5 ankle dorsiflexion, 3/5 ankle plantarflexion. Left: 3+/5 deltoid, 4-/5 bicep, 4-/5 tricep, 4/5 wrist extension,  4+/5 hand intrinsics. 1+/5 hip flexor, 2/5 knee extension, 3/5 ankle dorsiflexion, 3/5 ankle plantarflexion. No sensory deficits Skin: Skin is warm and dry. Patient sitting up and share with [place] buttocks was not examined  Psychiatric: generally pleasant and cooperative. Appears anxious  Assessment/Plan: 1. Functional deficits secondary to critical illness myopathy which require 3+ hours per day of interdisciplinary therapy in a comprehensive inpatient rehab setting. Physiatrist is providing close team supervision and 24 hour management of active medical problems listed below. Physiatrist and rehab team continue to assess barriers to discharge/monitor patient progress toward functional and medical goals. FIM: FIM - Bathing Bathing Steps Patient Completed: Chest, Right Arm, Left Arm, Abdomen Bathing: 2: Max-Patient completes 3-4 35f 10 parts or 25-49%  FIM - Upper Body Dressing/Undressing Upper body dressing/undressing steps patient completed: Thread/unthread right sleeve of pullover shirt/dresss, Thread/unthread left sleeve of pullover shirt/dress, Put head through opening of pull over shirt/dress Upper body dressing/undressing: 4: Min-Patient completed 75 plus % of tasks FIM - Lower Body Dressing/Undressing Lower body dressing/undressing: 1: Total-Patient completed less than 25% of tasks        FIM - Control and instrumentation engineer Devices: Walker, HOB elevated, Arm rests, Bed rails Bed/Chair Transfer: 3: Supine > Sit: Mod A (lifting assist/Pt. 50-74%/lift 2 legs, 3: Sit > Supine: Mod A (lifting assist/Pt. 50-74%/lift 2 legs), 1: Two helpers, 1: Chair or W/C > Bed: Total A (helper does all/Pt. < 25%), 4: Chair or W/C > Bed: Min A (steadying Pt. > 75%)  FIM - Locomotion: Wheelchair Distance: 35 Locomotion: Wheelchair: 1: Travels less than 50 ft with supervision,  cueing or coaxing FIM - Locomotion: Ambulation Locomotion: Ambulation Assistive Devices: Astronomer Ambulation/Gait Assistance: 1: +2 Total assist Locomotion: Ambulation: 1: Two helpers  Comprehension Comprehension Mode: Auditory Comprehension: 5-Follows basic conversation/direction: With extra time/assistive device  Expression Expression Mode: Verbal Expression: 5-Expresses basic needs/ideas: With extra time/assistive device  Social Interaction Social Interaction: 5-Interacts appropriately 90% of the time - Needs monitoring or encouragement for participation or interaction.  Problem Solving Problem Solving: 5-Solves complex 90% of the time/cues < 10% of the time  Memory Memory: 5-Recognizes or recalls 90% of the time/requires cueing < 10% of the time  Medical Problem List and Plan: 1. 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 2. DVT Prophylaxis/Anticoagulation: Lovenox.Monitor for any bleeding episodes 3. Pain Management: Tylenol as needed 4. COPD/OSA. continue nebulizers. Titrate oxygen. Followed by Dr. Joya Gaskins 5. Neuropsych: This patient is capable of making decisions on her own behalf.  -prn xanax for anxiety 6. Skin/Wound Care: Routine skin checks/need to check for any pressure ulcers 7. Fluids/Electrolytes/Nutrition: Strict I&O.Follow up labs 8. Chronic renal insufficiency. Baseline creatinine 6.76.   9. Diastolic congestive heart failure. Lasix 80 mg twice a day. Monitor for any signs of fluid overload. 10. Hypertension/atrial fibrillation. Amiodarone 200 mg daily, Imdur 30 mg daily. Coreg recently held. Cardiac rate control. Monitor with increased mobility. Echocardiogram remains pending from 08/11/2014 11. Hypothyroidism. Synthroid 12. Diabetes mellitus with peripheral neuropathy. Hemoglobin A1c 6.3.  Sliding scale insulin. Patient on 70/30 insulin 60 units twice a day prior to admission. Resume as tolerated 13. MRSA PCR screening positive. Contact precautions 14. Hyperlipidemia. Lipitor 15. RLS. Sinemet and  Requip. 16. Nausea: ?related to feso4. Hold for now. Add protonix.  -moved bowels yesterday 17. Low grade temp: frequent urination/ check ua/cx LOS (Days) 2 A FACE TO FACE EVALUATION WAS PERFORMED  SWARTZ,ZACHARY T 08/14/2014 8:08 AM

## 2014-08-15 ENCOUNTER — Inpatient Hospital Stay (HOSPITAL_COMMUNITY): Payer: Medicare Other

## 2014-08-15 ENCOUNTER — Inpatient Hospital Stay (HOSPITAL_COMMUNITY): Payer: Medicare Other | Admitting: Physical Therapy

## 2014-08-15 ENCOUNTER — Inpatient Hospital Stay (HOSPITAL_COMMUNITY): Payer: Medicare Other | Admitting: Occupational Therapy

## 2014-08-15 DIAGNOSIS — E1142 Type 2 diabetes mellitus with diabetic polyneuropathy: Secondary | ICD-10-CM

## 2014-08-15 LAB — COMPREHENSIVE METABOLIC PANEL
ALT: 7 U/L (ref 0–35)
AST: 16 U/L (ref 0–37)
Albumin: 2.8 g/dL — ABNORMAL LOW (ref 3.5–5.2)
Alkaline Phosphatase: 85 U/L (ref 39–117)
Anion gap: 7 (ref 5–15)
BUN: 17 mg/dL (ref 6–23)
CALCIUM: 8.4 mg/dL (ref 8.4–10.5)
CO2: 38 mmol/L — ABNORMAL HIGH (ref 19–32)
Chloride: 97 mmol/L (ref 96–112)
Creatinine, Ser: 1.88 mg/dL — ABNORMAL HIGH (ref 0.50–1.10)
GFR calc Af Amer: 29 mL/min — ABNORMAL LOW (ref >90)
GFR, EST NON AFRICAN AMERICAN: 25 mL/min — AB (ref >90)
Glucose, Bld: 153 mg/dL — ABNORMAL HIGH (ref 70–99)
Potassium: 3.6 mmol/L (ref 3.5–5.1)
SODIUM: 142 mmol/L (ref 135–145)
TOTAL PROTEIN: 6 g/dL (ref 6.0–8.3)
Total Bilirubin: 0.7 mg/dL (ref 0.3–1.2)

## 2014-08-15 LAB — CBC WITH DIFFERENTIAL/PLATELET
BASOS PCT: 1 % (ref 0–1)
Basophils Absolute: 0 10*3/uL (ref 0.0–0.1)
Eosinophils Absolute: 0.2 10*3/uL (ref 0.0–0.7)
Eosinophils Relative: 4 % (ref 0–5)
HEMATOCRIT: 32.7 % — AB (ref 36.0–46.0)
Hemoglobin: 9.8 g/dL — ABNORMAL LOW (ref 12.0–15.0)
Lymphocytes Relative: 22 % (ref 12–46)
Lymphs Abs: 1 10*3/uL (ref 0.7–4.0)
MCH: 26.4 pg (ref 26.0–34.0)
MCHC: 30 g/dL (ref 30.0–36.0)
MCV: 88.1 fL (ref 78.0–100.0)
Monocytes Absolute: 0.6 10*3/uL (ref 0.1–1.0)
Monocytes Relative: 13 % — ABNORMAL HIGH (ref 3–12)
NEUTROS ABS: 2.7 10*3/uL (ref 1.7–7.7)
Neutrophils Relative %: 60 % (ref 43–77)
PLATELETS: 179 10*3/uL (ref 150–400)
RBC: 3.71 MIL/uL — ABNORMAL LOW (ref 3.87–5.11)
RDW: 18.6 % — AB (ref 11.5–15.5)
WBC: 4.4 10*3/uL (ref 4.0–10.5)

## 2014-08-15 LAB — GLUCOSE, CAPILLARY
GLUCOSE-CAPILLARY: 147 mg/dL — AB (ref 70–99)
GLUCOSE-CAPILLARY: 116 mg/dL — AB (ref 70–99)
Glucose-Capillary: 191 mg/dL — ABNORMAL HIGH (ref 70–99)
Glucose-Capillary: 133 mg/dL — ABNORMAL HIGH (ref 70–99)

## 2014-08-15 MED ORDER — LIDOCAINE 5 % EX PTCH
1.0000 | MEDICATED_PATCH | CUTANEOUS | Status: DC
  Administered 2014-08-15 – 2014-08-19 (×5): 1 via TRANSDERMAL
  Filled 2014-08-15 (×5): qty 1

## 2014-08-15 NOTE — Progress Notes (Signed)
[facility] Admission Coordinator Signed Physical Medicine and Rehabilitation PMR Pre-admission 08/12/2014 12:04 PM  Related encounter: Admission (Discharged) from 08/05/2014 in [facility] Collapse All   PMR Admission Coordinator Pre-Admission Assessment  Patient: Meagan Campbell is an 74 y.o., female MRN: [id number] DOB: May 14, 1941 Height: $RemoveBefo'5\' 6"'KimCFInErWJ$  (167.6 cm) Weight: 127.007 kg (280 lb)  Insurance Information HMO: yes PPO: PCP: IPA: 80/20: OTHER:  PRIMARY: UHC Medicare Policy#: [id number] Subscriber: self CM Name: Loanne Drilling, RN Phone#: [phone] Fax#: [phone] Approval given on 08-12-14 from [employer], [place]. Follow up will be with onsite reviewer Archie Patten, RN (phone: [phone]) Pre-Cert#: [id number] Employer: retired Benefits: Phone #: [phone] Name: per [place]. Date: 05-27-14 Deduct: none Out of Pocket Max: [phone] (met $500.96) Life Max: none CIR: $430 copay/day for days 1-4, pre-auth needed SNF: $0/day for days 1-20; $160/day for days 21-62; $0/day for days 63-100 (100 days visit max, pre-auth needed) Outpatient: 100% Co-Pay: $40 copay, no visit limits Home Health: 100% Co-Pay: none DME: 80% Co-Pay: 20% Providers: in network   Emergency Contact Information Contact Information    Name Relation Home Work Mobile   [place] Spouse [phone]     Smith,Sheila Daughter   [phone]   Toshiye, Kever   [phone]   Glynda, Soliday   [phone]     Current Medical History  Patient Admitting Diagnosis: critical illness myopathy History of Present Illness: Meagan Campbell is a 74 y.o. right handed female with history of severe OHS/BiPAP  dependent, COPD on oxygen for many years, chronic kidney disease with baseline creatinine 0.97, CHF, diastolic congestive heart failure. Independent prior to admission using a walker living with her husband. Presented presented to [facility] 07/19/2014 with increased weight gain and shortness of breath. Treated with aggressive diuresis as well as findings of urinary tract infection placed on broad-spectrum antibiotics. Developed increased shortness of breath 07/27/2014 with respiratory arrest and was intubated. Extubated 08/04/2014 but required reintubation and transferred to [facility] 08/05/2014 for ongoing care per critical care medicine. Hospital course MRSA PCR screening positive. Patient was extubated 08/09/2014 with careful O2 titration. Subcutaneous Lovenox for DVT prophylaxis. Remains on amiodarone for history of atrial fibrillation. Tolerating a regular diet. Physical therapy evaluation completed 08/10/2014. Patient noted still to be limited by endurance fatigue factors. Recommendations made for physical medicine rehabilitation consult.   Past Medical History  Past Medical History  Diagnosis Date  . Anemia   . Edema   . Intention tremor   . Lymphedema   . Atrial fibrillation   . Chronic respiratory failure   . Respiratory failure   . Congestive heart failure   . Insomnia   . Adenomatous colon polyp   . MRSA (methicillin resistant staph aureus) culture positive   . Hypertension   . Proteinuria   . Hyperlipidemia   . Restless legs   . Idiopathic peripheral neuropathy   . Urinary tract infection   . COPD (chronic obstructive pulmonary disease)   . On home oxygen therapy     "2L at night" (08/10/2014)  . PNA (pneumonia) 05/2013; 05/2014  . OSA on CPAP   . Type II diabetes mellitus   . DDD (degenerative disc disease)   . Arthritis     "joints" (08/10/2014    Family History  family  history includes Cancer in her brother; Cancer - Colon in her mother; Cirrhosis in her father; Diabetes in her sister; Hypertension in her sister.  Prior Rehab/Hospitalizations:  pt has been at SNF about one year ago and had brief home health services after that.  Current Medications   Current facility-administered medications:  . amiodarone (PACERONE) tablet 200 mg, 200 mg, Oral, Daily, Wilhelmina Mcardle, MD, 200 mg at 08/12/14 1025 . aspirin tablet 325 mg, 325 mg, Oral, Daily, Brand Males, MD, 325 mg at 08/12/14 1025 . atorvastatin (LIPITOR) tablet 20 mg, 20 mg, Oral, q1800, Rush Farmer, MD . budesonide (PULMICORT) nebulizer solution 0.25 mg, 0.25 mg, Nebulization, 4 times per day, Wilhelmina Mcardle, MD, 0.25 mg at 08/12/14 1039 . carbidopa-levodopa (SINEMET IR) 25-250 MG per tablet immediate release 1 tablet, 1 tablet, Oral, TID, Brand Males, MD, 1 tablet at 08/12/14 1025 . enoxaparin (LOVENOX) injection 40 mg, 40 mg, Subcutaneous, Q24H, Wilhelmina Mcardle, MD, 40 mg at 08/12/14 1025 . ferrous sulfate tablet 325 mg, 325 mg, Oral, Q breakfast, Brand Males, MD, 325 mg at 08/12/14 0800 . furosemide (LASIX) tablet 80 mg, 80 mg, Oral, BID, Brand Males, MD, 80 mg at 08/12/14 0800 . insulin aspart (novoLOG) injection 0-15 Units, 0-15 Units, Subcutaneous, TID WC, Wilhelmina Mcardle, MD, 5 Units at 08/12/14 1256 . insulin aspart (novoLOG) injection 0-5 Units, 0-5 Units, Subcutaneous, QHS, Wilhelmina Mcardle, MD, 0 Units at 08/10/14 2225 . insulin aspart (novoLOG) injection 4 Units, 4 Units, Subcutaneous, TID WC, Wilhelmina Mcardle, MD, 4 Units at 08/12/14 1255 . ipratropium-albuterol (DUONEB) 0.5-2.5 (3) MG/3ML nebulizer solution 3 mL, 3 mL, Nebulization, Q6H, William S Minor, NP, 3 mL at 08/12/14 1039 . isosorbide mononitrate (IMDUR) 24 hr tablet 30 mg, 30 mg, Oral, Daily, Brand Males, MD, 30 mg at 08/12/14 1025 . levothyroxine (SYNTHROID, LEVOTHROID) tablet 75 mcg,  75 mcg, Oral, QAC breakfast, Wilhelmina Mcardle, MD, 75 mcg at 08/12/14 0800 . ondansetron (ZOFRAN) injection 4 mg, 4 mg, Intravenous, Q8H PRN, Kara Mead V, MD, 4 mg at 08/09/14 0139 . rOPINIRole (REQUIP) tablet 4 mg, 4 mg, Oral, QHS, Brand Males, MD, 4 mg at 08/11/14 2136 . sodium chloride 0.9 % injection 10-40 mL, 10-40 mL, Intracatheter, Q12H, Rush Farmer, MD, 10 mL at 08/10/14 1011 . sodium chloride 0.9 % injection 10-40 mL, 10-40 mL, Intracatheter, PRN, Rush Farmer, MD, 20 mL at 08/11/14 0559  Patients Current Diet: Diet heart healthy/carb modified  Precautions / Restrictions Precautions Precautions: Fall Restrictions Weight Bearing Restrictions: No   Prior Activity Level Limited Community (1-2x/wk): Pt got out a few times a week on errands with her husband (husband does the driving). She would ride the motorized carts in the store. Pt uses a rolling walker in the house and a rollator out in the community. She enjoys watching TV and likes to sit on her porch.   Home Assistive Devices / Equipment Home Assistive Devices/Equipment: CPAP, Walker (specify type), Wheelchair, Oxygen, CBG Meter, Eyeglasses, Bedside commode/3-in-1, Dentures (specify type), Shower chair with back, Hospital bed, Grab bars in shower, Grab bars around toilet, Hand-held shower hose Home Equipment: Environmental consultant - 2 wheels, Environmental consultant - 4 wheels, Wheelchair - manual, Bedside commode, Shower seat  Prior Functional Level Prior Function Level of Independence: Needs assistance Gait / Transfers Assistance Needed: Ambulates with RW in house, rollator outside. ADL's / Homemaking Assistance Needed: Husband would assist with bath.  Current Functional Level Cognition  Overall Cognitive Status: Within Functional Limits for tasks assessed Orientation Level: Oriented X4   Extremity Assessment (includes Sensation/Coordination)  Upper Extremity Assessment: Generalized weakness, RUE deficits/detail RUE Deficits /  Details: Patient with premorbid shoulder  limitations. During eval, pt with decreased shoulder A/PROM  Lower Extremity Assessment: Defer to PT evaluation    ADLs  Overall ADL's : Needs assistance/impaired Eating/Feeding: Set up, Sitting Grooming: Set up, Sitting Upper Body Bathing: Minimal assitance, Sitting Lower Body Bathing: Total assistance, Sit to/from stand, Cueing for safety Upper Body Dressing : Minimal assistance, Sitting Lower Body Dressing: Total assistance, Sit to/from stand, Cueing for safety Toilet Transfer: +2 for physical assistance, Maximal assistance Toilet Transfer Details (indicate cue type and reason): using STEDY Toileting- Clothing Manipulation and Hygiene: Total assistance, Sit to/from stand, Cueing for safety Toileting - Clothing Manipulation Details (indicate cue type and reason): using STEDY Functional mobility during ADLs: Maximal assistance, +2 for physical assistance General ADL Comments: Patient with decreased oxygen support and on 4 L of 02, sats remained at least 98% during therapy session. Used STEDY for patient and therapist safety during transfers. Patient required cues for energy conservation techniques, including pursed lip breathing. Patient unable to reach BLEs for LB ADLs and stated her husband assisted her with LB ADLs for the past year at least. Educated patient on use of AE and patient stated she wants to be more independent with her self-care tasks.     Mobility  Overal bed mobility: Needs Assistance Bed Mobility: Supine to Sit Supine to sit: Mod assist, HOB elevated General bed mobility comments: Mod A to follow through with trunk elevation to sit EOB. Cues for use of rails. HOB elevated. increased effort    Transfers  Overall transfer level: Needs assistance Equipment used: Rolling walker (2 wheeled) Transfers: Sit to/from Stand, Stand Pivot Transfers Sit to Stand: Max assist, +2 physical assistance, +2 safety/equipment Stand pivot  transfers: Mod assist, +2 physical assistance General transfer comment: Utilized stedy this session for sit to stand as patient unable to stand with use of RW. +2 max A to promote anterior translation and power up into full stand. Once weight shift up and over knee patient and to stand fully erect with +2 min A. Patient stood x4 with use of stedy to promote increased activity tolerance and repeatition.     Ambulation / Gait / Stairs / Wheelchair Mobility   not assessed, anticipate needs    Posture / Balance Balance Overall balance assessment: Needs assistance Sitting-balance support: No upper extremity supported, Feet supported Sitting balance-Leahy Scale: Fair Standing balance support: Bilateral upper extremity supported, During functional activity Standing balance-Leahy Scale: Poor    Special needs/care consideration BiPAP/CPAP - uses CPAP at home CPM no Continuous Drip IV no  Dialysis no  Life Vest no  Oxygen - currently on 3L O2 by nasal cannula, wears 2L O2 at baseline Special Bed no Trach Size no  Wound Vac (area) no  Skin - bruised R thumb and hand  Bowel mgmt: last BM on 08-11-14, + incontinence Bladder mgmt: + urinary incontinence, pt reports using the [facility] with assistance (?) Diabetic mgmt - managed at home with insulin R UE PICC line present.   Previous Home Environment Living Arrangements: Spouse/significant other Available Help at Discharge: Family, Available 24 hours/day Type of Home: House Home Layout: One level Home Access: Ramped entrance Bathroom Shower/Tub: Walk-in shower, Charity fundraiser: [place]: No  Discharge Living Setting Plans for Discharge Living Setting: Patient's home Type of Home at Discharge: House Discharge Home Access: [place] entrance Discharge Bathroom Shower/Tub: Walk-in shower Discharge Bathroom Toilet: Standard Does the patient have any problems obtaining  your medications?: No  Social/Family/Support Systems Patient Roles: Spouse (involved in  her family) Contact Information: husband Shanon Brow is primary contact Anticipated Caregiver: husband Anticipated Caregiver's Contact Information: see above Ability/Limitations of Caregiver: no limitations from husband Caregiver Availability: 24/7 Discharge Plan Discussed with Primary Caregiver: Yes Is Caregiver In Agreement with Plan?: Yes Does Caregiver/Family have Issues with Lodging/Transportation while Pt is in Rehab?: No  Goals/Additional Needs Patient/Family Goal for Rehab: Mod ind and supervision with PT/OT; NA for SLP Expected length of stay: 12-18 days Cultural Considerations: [employer] Dietary Needs: heart healthy Equipment Needs: to be determined Pt/Family Agrees to Admission and willing to participate: Yes (spoke with pt/husband on several occasions) Program Orientation Provided & Reviewed with Pt/Caregiver Including Roles & Responsibilities: Yes   Decrease burden of Care through IP rehab admission: NA  Possible need for SNF placement upon discharge: not anticipated  Patient Condition: This patient's condition remains as documented in the consult dated 08-11-14, in which the Rehabilitation Physician determined and documented that the patient's condition is appropriate for intensive rehabilitative care in an inpatient rehabilitation facility. Will admit to inpatient rehab today.  Preadmission Screen Completed By: Nanetta Batty, PT, 08/12/2014 1:21 PM ______________________________________________________________________  Discussed status with Dr. Naaman Plummer on 08-12-14 at 1321 and received telephone approval for admission today.  Admission Coordinator: Nanetta Batty, PT, time1321/Date 08-12-14          Cosigned by: Meredith Staggers, MD at 08/12/2014 3:06 PM  Revision History     Date/Time User Provider Type Action   08/12/2014 3:06 PM Meredith Staggers, MD Physician Cosign    08/12/2014 1:22 PM [facility] Admission Coordinator Sign

## 2014-08-15 NOTE — Progress Notes (Signed)
Placed patient on CPAP, via FFM auto titrate settings with 3 lpm O2 bleed in.  Patient tolerating well at this time.

## 2014-08-15 NOTE — Progress Notes (Signed)
Patient information reviewed and entered into eRehab System by Becky Cyani Kallstrom, covering PPS coordinator. Information including medical coding and functional independence measure will be reviewed and updated through discharge.  Per nursing, patient was given "Data Collection Information Summary for Patients in Inpatient Rehabilitation Facilities with attached Privacy Act Statement Health Care Records" upon admission.     

## 2014-08-15 NOTE — Progress Notes (Signed)
Occupational Therapy Session Note  Patient Details  Name: Jaquay Morneault MRN: [id number] Date of Birth: 12-Sep-1940  Today's Date: 08/15/2014 OT Individual Time: 1000-1100 OT Individual Time Calculation (min): 60 min    Short Term Goals: Week 1:  OT Short Term Goal 1 (Week 1): Pt will transfer to toilet/ BSC with min A  OT Short Term Goal 2 (Week 1): Pt will perform sit to stand from w/c with min A in prep for clothing management OT Short Term Goal 3 (Week 1): Pt will perform bed mobility to come to EOB with min A with extra time OT Short Term Goal 4 (Week 1): Pt will thread LB clothing with min  A  Skilled Therapeutic Interventions/Progress Updates:    Pt seen this session to facilitate activity tolerance and functional mobility. Pt had already had LB self care completed so she finished her UB bathing and dressing at the sink.  Pt worked on standing w/c to sink with min A to stand and cues for proper foot positioning and trunk alignment. Pt tolerated standing for 90 seconds with no desaturation. She then engaged in various UE and LE AROM exercises with very brief rest breaks after each 20 reps.  She then worked on standing to walker 4 x and each time she stood with less guidance and for longer periods of time.  Last stand, no A required, stood for 2 min with forward and backward stepping and hip sways. Discussed and practiced pressure relief strategies.  Pt resting in w/c at end of session with call light in reach.    Therapy Documentation Precautions:  Precautions Precautions: Fall Precaution Comments: edema in bilateral LEs - large TED hose in place Restrictions Weight Bearing Restrictions: No    Vital Signs: Oxygen Therapy SpO2: 97 % O2 Device: Nasal Cannula O2 Flow Rate (L/min): 3 L/min Pulse Oximetry Type: Intermittent Pain: Pain Assessment Pt c/o intermittent pain in knees when standing, no pain at rest. ADL: ADL ADL Comments: see FIM  See FIM for current functional  status  Therapy/Group: Individual Therapy  Vaness Jelinski 08/15/2014, 11:56 AM

## 2014-08-15 NOTE — Progress Notes (Signed)
[facility] RE[facility]ILITATION     PROGRESS NOTE    Subjective/Complaints: Right-sided rib pain, denies any trauma to that area denies any previous problems in that area, no rash noted per nursing Patient is on chronic O2 at home denies any shortness of breath Tolerated therapy over the weekend PT is in room preparing to get patient out of bed Review of systems: Negative except for weakness and right rib pain Objective: Vital Signs: Blood pressure 144/44, pulse 68, temperature 98.7 F (37.1 C), temperature source Oral, resp. rate 20, weight 141.568 kg (312 lb 1.6 oz), SpO2 95 %. No results found.  Recent Labs  08/12/14 1515 08/15/14 0625  WBC 6.7 4.4  HGB 10.3* 9.8*  HCT 34.8* 32.7*  PLT 212 179    Recent Labs  08/12/14 1515 08/15/14 0625  NA 144 142  K 3.9 3.6  CL 102 97  GLUCOSE 198* 153*  BUN 20 17  CREATININE 1.65* 1.88*  CALCIUM 9.2 8.4   CBG (last 3)   Recent Labs  08/14/14 1644 08/14/14 2117 08/15/14 0647  GLUCAP 205* 140* 147*    Wt Readings from Last 3 Encounters:  08/14/14 141.568 kg (312 lb 1.6 oz)  08/11/14 127.007 kg (280 lb)  06/28/14 130.636 kg (288 lb)    Physical Exam:  Constitutional: She is oriented to person, place, and time.  74 year old obese female in no distress HENT: oral mucosa moist, pink Head: Normocephalic.  Eyes: EOM are normal.  Neck: Normal range of motion. Neck supple. No thyromegaly present.  Cardiovascular:  Cardiac rate controlled without murmur Respiratory: no dyspnea, no wheezes or rales Lungs decreased breath sounds at the bases with good inspiratory effort--otherwise clear GI: Soft. Bowel sounds are normal. She exhibits no distension. Abdomen non tender Musculoskeletal:  +1 edema lower extremities. pain withpalpation along right axillary line/ribs. No bruising or swelling appreciated Neurological: She is alert and oriented to person, place, and time. . She exhibits normal muscle tone.  Mild tremor MMT-- Right: 3+/5 deltoid, 4-/5 bicep, 4-/5 tricep, 4/5 wrist extension, 4+/5 hand intrinsics. 13-/5 hip flexor, 3/5 knee extension, 3/5 ankle dorsiflexion, 3/5 ankle plantarflexion. Left: 3+/5 deltoid, 4-/5 bicep, 4-/5 tricep, 4/5 wrist extension, 4+/5 hand intrinsics. 3-/5 hip flexor, 3/5 knee extension, 3/5 ankle dorsiflexion, 3/5 ankle plantarflexion. Mild reduction to light touch, unable to feel when she has her TED hose on, proprioception intact in the toes, no sensory deficitsin the upper limbs Skin: Skin is warm and dry.stasis dermatitis changes bilateral pretibial, not severe ulcerations noted             Psychiatric: generally pleasant and cooperative. Appears anxious  Assessment/Plan: 1. Functional deficits secondary to critical illness myopathy which require 3+ hours per day of interdisciplinary therapy in a comprehensive inpatient rehab setting. Physiatrist is providing close team supervision and 24 hour management of active medical problems listed below. Physiatrist and rehab team continue to assess barriers to discharge/monitor patient progress toward functional and medical goals. FIM: FIM - Bathing Bathing Steps Patient Completed: Chest, Right Arm, Left Arm, Abdomen Bathing: 2: Max-Patient completes 3-4 75f 10 parts or 25-49%  FIM - Upper Body Dressing/Undressing Upper body dressing/undressing steps patient completed: Thread/unthread right sleeve of pullover shirt/dresss, Thread/unthread left sleeve of pullover shirt/dress, Put head through opening of pull over shirt/dress Upper body dressing/undressing: 4: Min-Patient completed 75 plus % of tasks FIM - Lower Body Dressing/Undressing Lower body dressing/undressing: 1: Total-Patient completed less than 25% of tasks     FIM - Toilet  Transfers Chief of Staff Devices: Recruitment consultant Transfers: 3-To toilet/BSC: Mod A (lift or lower assist), 3-From toilet/BSC: Mod A (lift or lower  assist)  FIM - Control and instrumentation engineer Devices: Walker, HOB elevated, Arm rests, Bed rails Bed/Chair Transfer: 3: Supine > Sit: Mod A (lifting assist/Pt. 50-74%/lift 2 legs, 3: Sit > Supine: Mod A (lifting assist/Pt. 50-74%/lift 2 legs), 1: Two helpers, 1: Chair or W/C > Bed: Total A (helper does all/Pt. < 25%), 4: Chair or W/C > Bed: Min A (steadying Pt. > 75%)  FIM - Locomotion: Wheelchair Distance: 35 Locomotion: Wheelchair: 0: Activity did not occur FIM - Locomotion: Ambulation Locomotion: Ambulation Assistive Devices: Administrator Ambulation/Gait Assistance: 3: Mod assist Locomotion: Ambulation: 1: Travels less than 50 ft with moderate assistance (Pt: 50 - 74%)  Comprehension Comprehension Mode: Auditory Comprehension: 5-Follows basic conversation/direction: With extra time/assistive device  Expression Expression Mode: Verbal Expression: 5-Expresses basic needs/ideas: With no assist  Social Interaction Social Interaction: 5-Interacts appropriately 90% of the time - Needs monitoring or encouragement for participation or interaction.  Problem Solving Problem Solving: 5-Solves basic 90% of the time/requires cueing < 10% of the time  Memory Memory: 5-Recognizes or recalls 90% of the time/requires cueing < 10% of the time  Medical Problem List and Plan: 1. 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 2. DVT Prophylaxis/Anticoagulation: Lovenox.Monitor for any bleeding episodes 3. Pain Management: Tylenol as needed 4. COPD/OSA. continue nebulizers. Titrate oxygen. Followed by Dr. Joya Gaskins 5. Neuropsych: This patient is capable of making decisions on her own behalf.  -prn xanax for anxiety 6. Skin/Wound Care: Routine skin checks/need to check for any pressure ulcers 7. Fluids/Electrolytes/Nutrition: Strict I&O.Follow up labs 8. Chronic renal insufficiency. Baseline creatinine 7.04.   9. Diastolic congestive  heart failure. Lasix 80 mg twice a day. Monitor for any signs of fluid overload. 10. Hypertension/atrial fibrillation. Amiodarone 200 mg daily, Imdur 30 mg daily. Coreg recently held. Cardiac rate control. Monitor with increased mobility. Echocardiogram remains pending from 08/11/2014 11. Hypothyroidism. Synthroid 12. Diabetes mellitus with peripheral neuropathy. Hemoglobin A1c 6.3.  Sliding scale insulin. Patient on 70/30 insulin 60 units twice a day prior to admission. Resume as tolerated 13. MRSA PCR screening positive. Contact precautions 14. Hyperlipidemia. Lipitor 15. RLS. Sinemet and Requip. 16. Nausea: ?related to feso4. Hold for now. Add protonix.  -moved bowels yesterday 17. Low grade temp: resolved, UA negative LOS (Days) 3 A FACE TO FACE EVALUATION WAS PERFORMED  KIRSTEINS,ANDREW E 08/15/2014 8:01 AM

## 2014-08-15 NOTE — Progress Notes (Addendum)
Physical Therapy Session Note  Patient Details  Name: Meagan Campbell MRN: [id number] Date of Birth: 17-Nov-1940  Today's Date: 08/15/2014 PT Individual Time: 0805-0910 PT Individual Time Calculation (min): 65 min   Short Term Goals: Week 1:  PT Short Term Goal 1 (Week 1): Pt will consistently sit to stand with 1 person assist and RW.  PT Short Term Goal 2 (Week 1): Pt will ambulate 40' with RW and 2nd assist for equipment management.  PT Short Term Goal 3 (Week 1): Pt will demonstrate bed mobility with HOB slightly elevated and bedrail (as at home) req min A.  PT Short Term Goal 4 (Week 1): Pt will self propel manual w/c >= 50' with B UEs req SBA.  PT Short Term Goal 5 (Week 1): Pt will tolerate 2 minutes of standing during activity without SaO2 dropping less than 90%.   Skilled Therapeutic Interventions/Progress Updates:   Bed mobility with min assist, HOB raised and bedrail. Assisted bridging to initiate donning pull up and pants.  Sit>< stand for donning brief and pants, +2  from raised bed. Pt stood x 2 minutes with wt shifting L<>R; min guard assist, with RW; limited by dizziness.  Bed> w/c using RW with mod assist, +2 to steady w/c. Gait on level tile x 22' , x 65' with min assist sit> stand to RW, min guard of 1, but +2 to follow with w/c for safety.    Therapy Documentation Precautions:  Precautions Precautions: Fall Precaution Comments: edema in bilateral LEs - large TED hose in place Restrictions Weight Bearing Restrictions: No   Vital Signs: Therapy Vitals Pulse Rate: 85 Oxygen Therapy SpO2: (!) 89 % (with exertion, after gait), quickly rising > 90% with rest and deep breathing O2 Device: Nasal Cannula O2 Flow Rate (L/min): 3 L/min Pain: Pain Assessment Pain Score: 6  Pain Location: Rib cage Pain Orientation: Right Pain Descriptors / Indicators: Aching Pain Intervention(s): RN made aware    See FIM for current functional status  Therapy/Group: Individual  Therapy  Dimitry Holsworth 08/15/2014, 9:45 AM

## 2014-08-15 NOTE — Progress Notes (Signed)
Meagan Staggers, MD Physician Signed Physical Medicine and Rehabilitation Consult Note 08/10/2014 11:52 AM  Related encounter: Admission (Discharged) from 08/05/2014 in [facility]apse [facility]        Physical Medicine and Rehabilitation Consult Reason for Consult: Debilitation related to respiratory failure Referring Physician: Critical care   HPI: Meagan Campbell is a 74 y.o. right handed female with history of severe OHS/BiPAP dependent, COPD on oxygen for many years, chronic kidney disease with baseline creatinine 7.48, CHF, diastolic congestive heart failure. Independent prior to admission using a walker living with her husband. Presented presented to [facility] 07/19/2014 with increased weight gain and shortness of breath. Treated with aggressive diuresis as well as findings of urinary tract infection placed on broad-spectrum antibiotics. Developed increased shortness of breath 07/27/2014 with respiratory arrest and was intubated. Extubated 08/04/2014 but required reintubation and transferred to [facility] 08/05/2014 for ongoing care per critical care medicine. Hospital course MRSA PCR screening positive. Patient was extubated 08/09/2014 with careful O2 titration. Subcutaneous Lovenox for DVT prophylaxis. Remains on amiodarone for history of atrial fibrillation. Tolerating a regular diet. Physical therapy evaluation completed 08/10/2014. Patient noted still to be limited by endurance fatigue factors. Recommendations made for physical medicine rehabilitation consult.   Review of Systems  Respiratory: Positive for shortness of breath.  Cardiovascular: Positive for palpitations and leg swelling.  Psychiatric/Behavioral: The patient has insomnia.  All other systems reviewed and are negative.  Past Medical History  Diagnosis Date  . Anemia   . Edema   . OSA (obstructive sleep apnea)   . Intention tremor     . Lymphedema   . Atrial fibrillation   . Chronic respiratory failure   . Diabetes   . DDD (degenerative disc disease)   . Respiratory failure   . PNA (pneumonia)   . Congestive heart failure   . Insomnia   . Adenomatous colon polyp   . MRSA (methicillin resistant staph aureus) culture positive   . Hypertension   . Proteinuria   . Hyperlipidemia   . Restless legs   . Idiopathic peripheral neuropathy   . Urinary tract infection    Past Surgical History  Procedure Laterality Date  . Rotator cuff repair    . Cholecystectomy     Family History  Problem Relation Age of Onset  . Diabetes Sister   . Hypertension Sister   . Cirrhosis Father   . Cancer - Colon Mother   . Cancer Brother     rectal cancer   Social History:  reports that she has never smoked. She has never used smokeless tobacco. She reports that she does not drink alcohol or use illicit drugs. Allergies:  Allergies  Allergen Reactions  . Codeine     nausea  . Darvon [Propoxyphene]     nausea   Medications Prior to Admission  Medication Sig Dispense Refill  . ACCU-CHEK AVIVA PLUS test strip Use as directed    . amiodarone (PACERONE) 200 MG tablet Take 200 mg by mouth daily.    Marland Kitchen aspirin 325 MG tablet Take 325 mg by mouth daily.    . Blood Glucose Calibration (ACCU-CHEK AVIVA) SOLN Use as directed    . Blood Glucose Monitoring Suppl (ACCU-CHEK AVIVA PLUS) W/DEVICE KIT Use as directed    . carbidopa-levodopa (SINEMET IR) 25-250 MG per tablet Take 1 tablet by mouth 3 (three) times daily.    . carvedilol (COREG) 6.25 MG tablet Take  6.25 mg by mouth 2 (two) times daily.    . ferrous sulfate 325 (65 FE) MG tablet Take 325 mg by mouth daily with breakfast.    . fosinopril (MONOPRIL) 40 MG tablet Take 40 mg by mouth daily.    . furosemide (LASIX) 40 MG tablet  Take 80 mg by mouth 2 (two) times daily.  0  . furosemide (LASIX) 80 MG tablet Take 120 mg by mouth 2 (two) times daily.    Marland Kitchen gabapentin (NEURONTIN) 300 MG capsule Take 300 mg by mouth daily.     . hydrALAZINE (APRESOLINE) 50 MG tablet Take 50 mg by mouth 3 (three) times daily.    . insulin aspart protamine- aspart (NOVOLOG MIX 70/30) (70-30) 100 UNIT/ML injection Inject 60 Units into the skin 2 (two) times daily with a meal.     . ipratropium-albuterol (DUONEB) 0.5-2.5 (3) MG/3ML SOLN Take 3 mLs by nebulization 2 (two) times daily.     . isosorbide mononitrate (IMDUR) 30 MG 24 hr tablet Take 30 mg by mouth daily.    Marland Kitchen levothyroxine (SYNTHROID, LEVOTHROID) 50 MCG tablet Take 50 mcg by mouth daily.  0  . magnesium oxide (MAG-OX) 400 MG tablet Take 400 mg by mouth 2 (two) times daily.    . Magnesium Oxide 400 (240 MG) MG TABS Take 1 tablet by mouth 2 (two) times daily.  3  . Melatonin 5 MG TABS Take 1 tablet by mouth at bedtime.     Marland Kitchen nystatin (MYCOSTATIN/NYSTOP) 100000 UNIT/GM POWD Apply topically as needed.   2  . Omega-3 Fatty Acids (FISH OIL CONCENTRATE PO) Take 2 capsules by mouth 2 (two) times daily.     . ondansetron (ZOFRAN) 4 MG tablet Take 1 tablet by mouth every 4 (four) hours as needed for nausea or vomiting.     Marland Kitchen rOPINIRole (REQUIP) 4 MG tablet Take 4 mg by mouth at bedtime. 2 tabs at bed time    . simvastatin (ZOCOR) 40 MG tablet Take 40 mg by mouth daily.    Marland Kitchen ULTILET CLASSIC LANCETS MISC by Does not apply route.      Home: Home Living Family/patient expects to be discharged to:: Private residence Living Arrangements: Spouse/significant other Available Help at Discharge: Family, Available 24 hours/day Type of Home: House Home Access: [place]: One [place]: Environmental consultant - 2 wheels, Environmental consultant - 4 wheels, Wheelchair - manual, Bedside commode, Shower seat  Functional  History: Prior Function Level of Independence: Needs assistance Gait / Transfers Assistance Needed: Ambulates with RW in house, rollator outside. ADL's / Homemaking Assistance Needed: Husband would assist with bath. Functional Status:  Mobility: Bed Mobility Overal bed mobility: Needs Assistance Bed Mobility: Supine to Sit Supine to sit: Min assist, HOB elevated General bed mobility comments: min assist for support through hand to allow pt to pull self into seated position. Able to scoot to edge of bed without assist. Requires extra time. Cues to breath frequently. Transfers Overall transfer level: Needs assistance Equipment used: Rolling walker (2 wheeled) Transfers: Sit to/from Stand, Stand Pivot Transfers Sit to Stand: Min assist, +2 physical assistance, From elevated surface Stand pivot transfers: Mod assist, +2 physical assistance General transfer comment: Min assist +2 for boost to stand and stability for posterior lean initially. Pivoting well until very end required Mod assist to control descent into chair as pt began to rush. Stating she felt lightheaded.      ADL:    Cognition: Cognition Overall Cognitive Status: Within Functional  Limits for tasks assessed Orientation Level: Oriented X4 Cognition Arousal/Alertness: Awake/alert Behavior During Therapy: WFL for tasks assessed/performed Overall Cognitive Status: Within Functional Limits for tasks assessed  Blood pressure 142/70, pulse 108, temperature 98.2 F (36.8 C), temperature source Oral, resp. rate 22, height $RemoveBe'5\' 6"'[id number]$  (1.676 m), weight 127.9 kg (281 lb 15.5 oz), SpO2 88 %. Physical Exam  Vitals reviewed. Constitutional: She is oriented to person, place, and time.  74 year old obese female  HENT:  Head: Normocephalic.  Eyes: EOM are normal.  Neck: Normal range of motion. Neck supple. No thyromegaly present.  Cardiovascular:  Cardiac rate controlled  Respiratory:  Lungs decreased breath sounds at the bases  with good inspiratory effort  GI: Soft. Bowel sounds are normal. She exhibits no distension.  Musculoskeletal:  +1 edema lower extremities.  Neurological: She is alert and oriented to person, place, and time. A cranial nerve deficit is present. She exhibits normal muscle tone.  MMT-- Right: 3/5 deltoid, 3/5 bicep, 3/5 tricep, 4/5 wrist extension, 4/5 hand intrinsics. 1+/5 hip flexor, 2/5 knee extension, 3/5 ankle dorsiflexion, 3/5 ankle plantarflexion. Left: 3/5 deltoid, 3/5 bicep, 3/5 tricep, 4/5 wrist extension, 4/5 hand intrinsics. 2-/5 hip flexor, 2/5 knee extension, 3/5 ankle dorsiflexion, 3/5 ankle plantarflexion. No sensory deficits Skin: Skin is warm and dry.  Psychiatric: She has a normal mood and affect. Her behavior is normal. Judgment and thought content normal.     Lab Results Last 24 Hours    Results for orders placed or performed during the hospital encounter of 08/05/14 (from the past 24 hour(s))  Glucose, capillary Status: Abnormal   Collection Time: 08/09/14 3:55 PM  Result Value Ref Range   Glucose-Capillary 160 (H) 70 - 99 mg/dL  Glucose, capillary Status: Abnormal   Collection Time: 08/09/14 7:14 PM  Result Value Ref Range   Glucose-Capillary 118 (H) 70 - 99 mg/dL  Glucose, capillary Status: Abnormal   Collection Time: 08/09/14 11:15 PM  Result Value Ref Range   Glucose-Capillary 135 (H) 70 - 99 mg/dL   Comment 1 Notify RN   Glucose, capillary Status: Abnormal   Collection Time: 08/10/14 3:52 AM  Result Value Ref Range   Glucose-Capillary 149 (H) 70 - 99 mg/dL   Comment 1 Notify RN   Basic metabolic panel Status: Abnormal   Collection Time: 08/10/14 5:14 AM  Result Value Ref Range   Sodium 144 135 - 145 mmol/L   Potassium 3.7 3.5 - 5.1 mmol/L   Chloride 103 96 - 112 mmol/L   CO2 35 (H) 19 - 32 mmol/L   Glucose, Bld 166 (H) 70 - 99 mg/dL   BUN 24  (H) 6 - 23 mg/dL   Creatinine, Ser 1.54 (H) 0.50 - 1.10 mg/dL   Calcium 9.0 8.4 - 10.5 mg/dL   GFR calc non Af Amer 32 (L) >90 mL/min   GFR calc Af Amer 37 (L) >90 mL/min   Anion gap 6 5 - 15  CBC Status: Abnormal   Collection Time: 08/10/14 5:14 AM  Result Value Ref Range   WBC 6.8 4.0 - 10.5 K/uL   RBC 3.61 (L) 3.87 - 5.11 MIL/uL   Hemoglobin 9.5 (L) 12.0 - 15.0 g/dL   HCT 33.3 (L) 36.0 - 46.0 %   MCV 92.2 78.0 - 100.0 fL   MCH 26.3 26.0 - 34.0 pg   MCHC 28.5 (L) 30.0 - 36.0 g/dL   RDW 19.2 (H) 11.5 - 15.5 %   Platelets 177 150 - 400 K/uL  Glucose, capillary  Status: Abnormal   Collection Time: 08/10/14 8:19 AM  Result Value Ref Range   Glucose-Capillary 133 (H) 70 - 99 mg/dL      Imaging Results (Last 48 hours)    Dg Chest Port 1 View  08/10/2014 CLINICAL DATA: Respiratory failure EXAM: PORTABLE CHEST - 1 VIEW COMPARISON: 08/09/2014; 08/07/2014; 08/06/2014 FINDINGS: Grossly unchanged enlarged cardiac silhouette and mediastinal contour given persistently reduced lung volumes. Atherosclerotic plaque within the thoracic aorta is. Interval extubation and removal of enteric tube. Otherwise, stable positioning of remaining support apparatus. No pneumothorax. Pulmonary vasculature remains indistinct with cephalization of flow. Heterogeneous opacities within the right mid and left lower lung are unchanged. No definite pleural effusion. Unchanged bones including old left-sided rib fractures. IMPRESSION: 1. Interval extubation and removal of enteric tube. Otherwise, stable positioning of remaining support apparatus. No pneumothorax. 2. Similar findings of pulmonary edema and bibasilar opacities, left greater than right, atelectasis versus infiltrate. Electronically Signed By: Sandi Mariscal M.D. On: 08/10/2014 07:11   Dg Chest Port 1 View  08/09/2014 CLINICAL DATA: Hypoxia/respiratory failure EXAM:  PORTABLE CHEST - 1 VIEW COMPARISON: August 07, 2014 FINDINGS: Endotracheal tube tip is 3.0 cm above the carina. Central catheter tip is in the right atrium. Nasogastric tube tip and side port are below the diaphragm. No pneumothorax. There is patchy bibasilar atelectatic change, slightly more on the left than on the right. Lungs elsewhere clear. Heart is mildly enlarged with pulmonary vascularity within normal limits. There is postoperative change in the lower cervical spine region. IMPRESSION: Patchy bibasilar atelectasis. Tube and catheter positions as described without pneumothorax. No change in cardiac silhouette. Electronically Signed By: Lowella Grip III M.D. On: 08/09/2014 07:31     Assessment/Plan: Diagnosis: Critical illness myopathy 1. Does the need for close, 24 hr/day medical supervision in concert with the patient's rehab needs make it unreasonable for this patient to be served in a less intensive setting? Yes 2. Co-Morbidities requiring supervision/potential complications: OSA, CHF, COPD, morbid obesity 3. Due to bladder management, bowel management, safety, skin/wound care, disease management, medication administration, pain management and patient education, does the patient require 24 hr/day rehab nursing? Yes 4. Does the patient require coordinated care of a physician, rehab nurse, PT (1-2 hrs/day, 5 days/week) and OT (1-2 hrs/day, 5 days/week) to address physical and functional deficits in the context of the above medical diagnosis(es)? Yes Addressing deficits in the following areas: balance, endurance, locomotion, strength, transferring, bowel/bladder control, bathing, dressing, feeding, grooming, toileting and psychosocial support 5. Can the patient actively participate in an intensive therapy program of at least 3 hrs of therapy per day at least 5 days per week? Yes 6. The potential for patient to make measurable gains while on inpatient rehab is  excellent 7. Anticipated functional outcomes upon discharge from inpatient rehab are modified independent and supervision with PT, modified independent and supervision with OT, n/a with SLP. 8. Estimated rehab length of stay to reach the above functional goals is: 12-18 days 9. Does the patient have adequate social supports and living environment to accommodate these discharge functional goals? Yes 10. Anticipated D/C setting: Home 11. Anticipated post D/C treatments: [place] therapy 12. Overall Rehab/Functional Prognosis: excellent  RECOMMENDATIONS: This patient's condition is appropriate for continued rehabilitative care in the following setting: CIR Patient has agreed to participate in recommended program. Yes Note that insurance prior authorization may be required for reimbursement for recommended care.  Comment: Rehab Admissions Coordinator to follow up.  Thanks,  Meagan Staggers, MD, Mellody Drown  08/10/2014       Revision History     Date/Time User Provider Type Action   08/11/2014 10:13 AM Meagan Staggers, MD Physician Sign   08/10/2014 12:12 PM Cathlyn Parsons, PA-C Physician Assistant Pend   View Details Report       Routing History     Date/Time From To Method   08/11/2014 10:13 AM Meagan Staggers, MD Meagan Staggers, MD In Basket   08/11/2014 10:13 AM Meagan Staggers, MD Nicoletta Dress, MD Fax

## 2014-08-15 NOTE — Progress Notes (Signed)
Physical Therapy Session Note  Patient Details  Name: Meagan Campbell MRN: [id number] Date of Birth: 01/14/1941  Today's Date: 08/15/2014 PT Individual Time: 1500-1600 PT Individual Time Calculation (min): 60 min   Short Term Goals: Week 1:  PT Short Term Goal 1 (Week 1): Pt will consistently sit to stand with 1 person assist and RW.  PT Short Term Goal 2 (Week 1): Pt will ambulate 75' with RW and 2nd assist for equipment management.  PT Short Term Goal 3 (Week 1): Pt will demonstrate bed mobility with HOB slightly elevated and bedrail (as at home) req min A.  PT Short Term Goal 4 (Week 1): Pt will self propel manual w/c >= 50' with B UEs req SBA.  PT Short Term Goal 5 (Week 1): Pt will tolerate 2 minutes of standing during activity without SaO2 dropping less than 90%.   Skilled Therapeutic Interventions/Progress Updates:   Pt requesting to use BSC for BM.  Pt transitioned to EOB with extra time with min-mod A.  Seated EOB pt reporting difficulty with breathing.  Verbally guided patient through pursed lip breathing and encouraged pt to relax tension in upper traps.  Once pt calmed, pt performed stand pivot bed > BSC with UE support on arm rests and mod A.  Pt stood with mod A and UE support on RW for NT to perform hygiene and to assist with donning clothing.  Pt transferred BSC > w/c stand pivot with RW mod A.  Transported to gym in w/c total A.  Pt performed ambulation x 20' + 20' with RW and min A for endurance training with one sitting rest break; during ambulation patient verbally cued for shoulder depression and more upright trunk for increased breath support.  During seated rest break pt engaged in LE strengthening exercises and pursed lip breathing.  Pt reporting pain in her buttocks this morning during prolonged sitting in w/c.  Secondary to patient at increased risk for breakdown due to low Braden score pt basic w/c cushion replaced with high profile ROHO.  Returned to room and patient  requesting to sit in recliner for dinner.  Pt transferred with RW and mod A.  Pt set up with all items within reach.         Therapy Documentation Precautions:  Precautions Precautions: Fall Precaution Comments: edema in bilateral LEs - large TED hose in place Restrictions Weight Bearing Restrictions: No Vital Signs: Therapy Vitals Temp: 98.1 F (36.7 C) Temp Source: Oral Pulse Rate: 81 Resp: 18 BP: (!) 135/59 mmHg Patient Position (if appropriate): Sitting Oxygen Therapy SpO2: 95 % O2 Device: Nasal Cannula O2 Flow Rate (L/min): 3 L/min Pulse Oximetry Type: Intermittent Pain: Pain Assessment Pain Assessment: No/denies pain Pain Score: 2  Locomotion : Ambulation Ambulation/Gait Assistance: 4: Min assist   See FIM for current functional status  Therapy/Group: Individual Therapy  Raylene Everts [facility] 08/15/2014, 4:49 PM

## 2014-08-15 NOTE — Progress Notes (Addendum)
Social Work Assessment and Plan  Patient Details  Name: Meagan Campbell MRN: [id number] Date of Birth: Nov 22, 1940  Today's Date: 08/15/2014  Problem List:  Patient Active Problem List   Diagnosis Date Noted  . 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 Acute respiratory failure with hypercapnia 08/05/2014  . Respiratory failure 08/05/2014  . Chronic pain 06/28/2014  . DDD (degenerative disc disease), lumbar 06/28/2014  . Diabetes mellitus type 2, uncontrolled 06/28/2014  . Nerve root pain 06/28/2014  . Adiposity 06/28/2014  . Arthritis of knee, degenerative 06/28/2014  . DM (diabetes mellitus), type 2 with neurological complications 05/29/7251  . Obesity hypoventilation syndrome 08/11/2013  . Morbid obesity 08/11/2013  . CKD (chronic kidney disease) stage 4, GFR 15-29 ml/min 08/10/2013  . Anemia   . OSA (obstructive sleep apnea)   . Atrial fibrillation   . Chronic respiratory failure   . DM type 2, uncontrolled, with renal complications   . DDD (degenerative disc disease)   . Chronic diastolic heart failure   . Idiopathic peripheral neuropathy   . Restless legs   . Hyperlipidemia   . Proteinuria   . MRSA (methicillin resistant staph aureus) culture positive   . Hypertension    Past Medical History:  Past Medical History  Diagnosis Date  . Anemia   . Edema   . Intention tremor   . Lymphedema   . Atrial fibrillation   . Chronic respiratory failure   . Respiratory failure   . Congestive heart failure   . Insomnia   . Adenomatous colon polyp   . MRSA (methicillin resistant staph aureus) culture positive   . Hypertension   . Proteinuria   . Hyperlipidemia   . Restless legs   . Idiopathic peripheral neuropathy   . Urinary tract infection   . COPD (chronic obstructive pulmonary disease)   . On home oxygen therapy     "2L at night" (08/10/2014)  . PNA (pneumonia) 05/2013; 05/2014  . OSA on CPAP   . Type II diabetes mellitus   . DDD (degenerative disc disease)   .  Arthritis     "joints" (08/10/2014   Past Surgical History:  Past Surgical History  Procedure Laterality Date  . Shoulder arthroscopy w/ rotator cuff repair Left   . Abdominal hysterectomy    . Laparoscopic cholecystectomy    . Knee arthroscopy Right   . Cataract extraction w/ intraocular lens  implant, bilateral     Social History:  reports that she has never smoked. She has never used smokeless tobacco. She reports that she does not drink alcohol or use illicit drugs.  Family / Support Systems Marital Status: Married How Long?: 34 years Patient Roles: Spouse, Parent Spouse/Significant Other: Sendy Pluta - husband - [phone] Children: Crisoforo Oxford - dtr - [phone]; Reyne Falconi - son - [phone]; Siarra Gilkerson - son - [phone] Anticipated Caregiver: husband Ability/Limitations of Caregiver: no limitations from husband Caregiver Availability: 24/7 Family Dynamics: supportive family  Social History Preferred language: English Religion: Baptist Read: Yes Write: Yes Employment Status: Retired Date Retired/Disabled/Unemployed: 2011 Age Retired: 31 Public relations account executive Issues: none reported Guardian/Conservator: N/A   Abuse/Neglect Physical Abuse: Denies Verbal Abuse: Denies Sexual Abuse: Denies Exploitation of patient/patient's resources: Denies Self-Neglect: Denies  Emotional Status Pt's affect, behavior and adjustment status: Pt reports feeling okay emotionally despite her hospitalization and medical issues.  Therapists have reported pt has anxiety with them, but she did not disclose this to [place]. Recent Psychosocial Issues: none reported Psychiatric History: none reported Substance  Abuse History: none reported  Patient / Family Perceptions, Expectations & Goals Pt/Family understanding of illness & functional limitations: Pt reports a good understanding of how CIR works and did not have any questions about CIR or her condition.  Pt was, however, uncomfortable  during out visit.  CSW notified pt's RN and PT. Premorbid pt/family roles/activities: Pt enjoys running errands with her husband, spending time with her family, and sitting on her porch outside.  Pt has 3 children, 4 grandchildren, and 8 great grandchildren.  Pt does family finances. Anticipated changes in roles/activities/participation: Pt hopes to be able to return to the above activities. Pt/family expectations/goals: Pt's goal is to get stronger so that she can get up and walk about on her own.  Community [employer] Agencies: None Premorbid Home Care/DME Agencies: Other (Comment) (O2 from [facility]; Home health from [facility]) Transportation available at discharge: family Resource referrals recommended: Neuropsychology  Discharge Planning Living Arrangements: Spouse/significant other Support Systems: Spouse/significant other, Children Type of Residence: Private residence Insurance Resources: Multimedia programmer (specify) ([place] Medicare) Financial Resources: Radio broadcast assistant Screen Referred: No Money Management: Patient, Family (dtr is helping with money management while pt is admitted to CIR) Does the patient have any problems obtaining your medications?: No Home Management: Pt's husband performs most of the household chores. Patient/Family Preliminary Plans: Pt plans to return home to the care of her husband and family. Barriers to Discharge:  (none - pt has ramped entrance) Social Work Anticipated Follow Up Needs: HH/OP Expected length of stay: 14 to 17 daya  Clinical Impression CSW met with pt to introduce self and role of CSW, as well as to complete assessment.  Pt was visibly uncomfortable during CSW's visit.  Pt had just received tylenol, but she was not receiving relief from it yet.  CSW informed pt's RN and PT, as pt was in the w/c and may need specialty cushion due to skin breakdown.  Pt's family was not visiting at the time, but pt expects that her husband  will be her caregiver at d/c. Pt reports her husband is physically able to help her as needed.  She receives strength through her family.  Her dtr is helping with finances, as pt would normally do this at home.  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  Pt wants to get stronger so that she can get around on her own like she was PTA.  Pt had no current concerns/questions/needs, however CSW will continue to follow and assist as needed throughout pt's stay on CIR.  Holten Spano, Silvestre Mesi 08/15/2014, 1:46 PM

## 2014-08-15 NOTE — Progress Notes (Signed)
[facility] Individual Statement of Services  Patient Name:  Shenique Childers  Date:  08/15/2014  Welcome to the [facility].  Our goal is to provide you with an individualized program based on your diagnosis and situation, designed to meet your specific needs.  With this comprehensive rehabilitation program, you will be expected to participate in at least 3 hours of rehabilitation therapies Monday-Friday, with modified therapy programming on the weekends.  Your rehabilitation program will include the following services:  Physical Therapy (PT), Occupational Therapy (OT), 24 hour per day rehabilitation nursing, Case Management (Social Worker), Rehabilitation Medicine, Nutrition Services and Pharmacy Services  Weekly team conferences will be held on Wednesdays to discuss your progress.  Your Social Worker will talk with you frequently to get your input and to update you on team discussions.  Team conferences with you and your family in attendance may also be held.  Expected length of stay:  14 to 17 days  Overall anticipated outcome:  Supervision  Depending on your progress and recovery, your program may change. Your Social Worker will coordinate services and will keep you informed of any changes. Your Social Worker's name and contact numbers are listed  below.  The following services may also be recommended but are not provided by the [facility] will be made to provide these services after discharge if needed.  Arrangements include referral to agencies that provide these services.  Your insurance has been verified to be:  [facility] Medicare Your primary doctor is:  Dr. Nelda Bucks  Pertinent information will be shared with your doctor and your insurance company.  Social Worker:  Alfonse Alpers, LCSW  [phone] or ([phone]  Information discussed with and copy given to patient by: Trey Sailors, 08/15/2014, 2:28 PM

## 2014-08-16 ENCOUNTER — Inpatient Hospital Stay (HOSPITAL_COMMUNITY): Payer: Medicare Other | Admitting: *Deleted

## 2014-08-16 ENCOUNTER — Inpatient Hospital Stay (HOSPITAL_COMMUNITY): Payer: Medicare Other | Admitting: Occupational Therapy

## 2014-08-16 DIAGNOSIS — I8311 Varicose veins of right lower extremity with inflammation: Secondary | ICD-10-CM

## 2014-08-16 DIAGNOSIS — I8312 Varicose veins of left lower extremity with inflammation: Secondary | ICD-10-CM

## 2014-08-16 LAB — GLUCOSE, CAPILLARY
GLUCOSE-CAPILLARY: 137 mg/dL — AB (ref 70–99)
Glucose-Capillary: 162 mg/dL — ABNORMAL HIGH (ref 70–99)
Glucose-Capillary: 165 mg/dL — ABNORMAL HIGH (ref 70–99)
Glucose-Capillary: 156 mg/dL — ABNORMAL HIGH (ref 70–99)

## 2014-08-16 LAB — URINE CULTURE: Colony Count: 40000

## 2014-08-16 MED ORDER — AMMONIUM LACTATE 5 % EX LOTN
TOPICAL_LOTION | Freq: Two times a day (BID) | CUTANEOUS | Status: DC
  Administered 2014-08-16 – 2014-08-19 (×7): via TOPICAL
  Filled 2014-08-16: qty 222

## 2014-08-16 NOTE — Progress Notes (Signed)
Physical Therapy Session Note  Patient Details  Name: Meagan Campbell MRN: [id number] Date of Birth: [birth year]  Today's Date: 08/16/2014 PT Individual Time:  11:06-12:05 (35min)  And 13:00-14:02 (73min)   Short Term Goals: Week 1:  PT Short Term Goal 1 (Week 1): Pt will consistently sit to stand with 1 person assist and RW.  PT Short Term Goal 2 (Week 1): Pt will ambulate 48' with RW and 2nd assist for equipment management.  PT Short Term Goal 3 (Week 1): Pt will demonstrate bed mobility with HOB slightly elevated and bedrail (as at home) req min A.  PT Short Term Goal 4 (Week 1): Pt will self propel manual w/c >= 50' with B UEs req SBA.  PT Short Term Goal 5 (Week 1): Pt will tolerate 2 minutes of standing during activity without SaO2 dropping less than 90%.   Skilled Therapeutic Interventions/Progress Updates:   First tx focused on functional mobility training, Five times Sit to Stand test, gait with RW, and therex for strengthening and activity tolerance. Handoff from OT - trialing pt on 2 L, however once pt up in standing, 02 dropped to 86%, so returned to 3L for remained of tx. Pt fatigued throuhgout, but able to participate in all tasks with therapeutric rest breaks.   Pt performed 5X STS test at 2:12 time needed, indicating high risk of recurrent falls. Pt needed to perform from elevated 22" surface with close S and cues for anterior translation over BOS. Pt quite SOB during this task.   Pt needed up to mod lifting assist from lower surfaces and min/mod for stand step transfers with RW and cues for safety throughout tx.  Nustep x10 min level 3>>5 with bil UE/LE at 45-60spm pace without rest break but excursion limited by body habitus. Pt performed seated marching and LAQ 2x10 unsupported sitting during rest breaks.   Gait with RW 2x25' with Min steadying assist. Pt self-selecting distance based on fatigue; cues for posture.   Second tx focused on gait over ramp, WC propulsion, and  therex via step-taps and step-up.  Pt instructed in WC propulsion x40' with Min A for steering.   Pt performed repeated gait trials  2x40' and 1x60' up incline at [facility] with min-guard A and cues for breathing, posture, encouragement to continue. Pt able to perform 50% sit<>stands with close S 50% of the time, and Min A for toilet transfer.   Step-taps with bil UE assist at stairs to 4" step x10 bil with encouragement to continue.  Step-up/down 4" step x2 with bil rails and min A for steadying. Limited by fatigue.   Reviewed and instructed pt in incentive spirometer x10 with cues for technique.        Therapy Documentation Precautions:  Precautions Precautions: Fall Precaution Comments: edema in bilateral LEs - large TED hose in place Restrictions Weight Bearing Restrictions: No General:   Vital Signs: Oxygen Therapy SpO2: 97 % O2 Device: Nasal Cannula O2 Flow Rate (L/min): 3 L/min Pain: none    See FIM for current functional status  Therapy/Group: Individual Therapy  Kennieth Rad, PT, DPT   08/16/2014, 11:41 AM

## 2014-08-16 NOTE — Progress Notes (Signed)
[facility] PHYSICAL MEDICINE & REHABILITATION     PROGRESS NOTE    Subjective/Complaints: Slept poorly, restless legs Denies breathing issues, bowel or bladder issues or pain that is keeping her awake Review of systems: Negative except for weakness and right rib pain Objective: Vital Signs: Blood pressure 141/62, pulse 79, temperature 98.6 F (37 C), temperature source Oral, resp. rate 17, weight 130.3 kg (287 lb 4.2 oz), SpO2 99 %. No results found.  Recent Labs  08/15/14 0625  WBC 4.4  HGB 9.8*  HCT 32.7*  PLT 179    Recent Labs  08/15/14 0625  NA 142  K 3.6  CL 97  GLUCOSE 153*  BUN 17  CREATININE 1.88*  CALCIUM 8.4   CBG (last 3)   Recent Labs  08/15/14 1653 08/15/14 2050 08/16/14 0651  GLUCAP 116* 133* 137*    Wt Readings from Last 3 Encounters:  08/16/14 130.3 kg (287 lb 4.2 oz)  08/11/14 127.007 kg (280 lb)  06/28/14 130.636 kg (288 lb)    Physical Exam:  Constitutional: She is oriented to person, place, and time.  74 year old obese female in no distress HENT: oral mucosa moist, pink Head: Normocephalic.  Eyes: EOM are normal.  Neck: Normal range of motion. Neck supple. No thyromegaly present.  Cardiovascular:  Cardiac rate controlled without murmur Respiratory: no dyspnea, no wheezes or rales Lungs decreased breath sounds at the bases with good inspiratory effort--otherwise clear GI: Soft. Bowel sounds are normal. She exhibits no distension. Abdomen non tender Musculoskeletal:  +1 edema lower extremities. pain withpalpation along right axillary line/ribs. No bruising or swelling appreciated Neurological: She is alert and oriented to person, place, and time. . She exhibits normal muscle tone. Mild tremor MMT-- Right: 3+/5 deltoid, 4-/5 bicep, 4-/5 tricep, 4/5 wrist extension, 4+/5 hand intrinsics. 3-/5 hip flexor, 3/5 knee extension, 3/5 ankle dorsiflexion, 3/5 ankle plantarflexion. Left: 3+/5 deltoid, 4-/5 bicep, 4-/5 tricep, 4/5  wrist extension, 4+/5 hand intrinsics. 3-/5 hip flexor, 3/5 knee extension, 3/5 ankle dorsiflexion, 3/5 ankle plantarflexion. Mild reduction to light touch, unable to feel when she has her TED hose on, proprioception intact in the toes, no sensory deficitsin the upper limbs Skin: Skin is warm and dry.stasis dermatitis changes bilateral pretibial, no ulcerations noted             Psychiatric: generally pleasant and cooperative. Appears anxious  Assessment/Plan: 1. Functional deficits secondary to critical illness myopathy which require 3+ hours per day of interdisciplinary therapy in a comprehensive inpatient rehab setting. Physiatrist is providing close team supervision and 24 hour management of active medical problems listed below. Physiatrist and rehab team continue to assess barriers to discharge/monitor patient progress toward functional and medical goals. FIM: FIM - Bathing Bathing Steps Patient Completed: Chest, Right Arm, Left Arm, Abdomen Bathing: 2: Max-Patient completes 3-4 48f 10 parts or 25-49%  FIM - Upper Body Dressing/Undressing Upper body dressing/undressing steps patient completed: Thread/unthread right sleeve of pullover shirt/dresss, Thread/unthread left sleeve of pullover shirt/dress, Put head through opening of pull over shirt/dress, Pull shirt over trunk Upper body dressing/undressing: 5: Set-up assist to: Obtain clothing/put away FIM - Lower Body Dressing/Undressing Lower body dressing/undressing: 1: Total-Patient completed less than 25% of tasks  FIM - Toileting Toileting: 1: Two helpers  FIM - Radio producer Devices: Recruitment consultant Transfers: 3-To toilet/BSC: Mod A (lift or lower assist), 3-From toilet/BSC: Mod A (lift or lower assist)  FIM - Control and instrumentation engineer Devices: Adult nurse Transfer: 3: Chair  or W/C > Bed: Mod A (lift or lower assist), 3: Bed > Chair or W/C: Mod A (lift or lower  assist)  FIM - Locomotion: Wheelchair Distance: 35 Locomotion: Wheelchair: 1: Total Assistance/staff pushes wheelchair (Pt<25%) FIM - Locomotion: Ambulation Locomotion: Ambulation Assistive Devices: Administrator Ambulation/Gait Assistance: 4: Min assist Locomotion: Ambulation: 1: Travels less than 50 ft with minimal assistance (Pt.>75%)  Comprehension Comprehension Mode: Auditory Comprehension: 5-Follows basic conversation/direction: With extra time/assistive device  Expression Expression Mode: Verbal Expression: 5-Expresses basic needs/ideas: With no assist  Social Interaction Social Interaction: 5-Interacts appropriately 90% of the time - Needs monitoring or encouragement for participation or interaction.  Problem Solving Problem Solving: 5-Solves basic 90% of the time/requires cueing < 10% of the time  Memory Memory: 5-Recognizes or recalls 90% of the time/requires cueing < 10% of the time  Medical Problem List and Plan: 1. 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 2. DVT Prophylaxis/Anticoagulation: Lovenox.Monitor for any bleeding episodes 3. Pain Management: Tylenol as needed 4. COPD/OSA. continue nebulizers. Titrate oxygen. Followed by Dr. Joya Gaskins 5. Neuropsych: This patient is capable of making decisions on her own behalf.  -prn xanax for anxiety 6. Skin/Wound Care: Routine skin checks/need to check for any pressure ulcers, Lac Hydrin for dermatitis 7. Fluids/Electrolytes/Nutrition: Strict I&O.Follow up labs 8. Chronic renal insufficiency. Baseline creatinine 5.46.   9. Diastolic congestive heart failure. Lasix 80 mg twice a day. Monitor for any signs of fluid overload. 10. Hypertension/atrial fibrillation. Amiodarone 200 mg daily, Imdur 30 mg daily. Coreg recently held. Cardiac rate control. Monitor with increased mobility. Echocardiogram remains pending from 08/11/2014 11. Hypothyroidism. Synthroid 12. Diabetes mellitus  with peripheral neuropathy. Hemoglobin A1c 6.3.  Sliding scale insulin. Patient on 70/30 insulin 60 units twice a day prior to admission. Resume as tolerated 13. MRSA PCR screening positive. Contact precautions 14. Hyperlipidemia. Lipitor 15. RLS. Sinemet and Requip.Adjust dose 16. Nausea: ?related to feso4. Hold for now. Add protonix.  -moved bowels yesterday 17. Low grade temp: resolved, UA negative LOS (Days) 4 A FACE TO FACE EVALUATION WAS PERFORMED  Alysia Penna E 08/16/2014 8:29 AM

## 2014-08-16 NOTE — Progress Notes (Signed)
Occupational Therapy Session Note  Patient Details  Name: Meagan Campbell MRN: [id number] Date of Birth: 1941/03/04  Today's Date: 08/16/2014 OT Individual Time: 1005-1106 OT Individual Time Calculation (min): 61 min    Short Term Goals: Week 1:  OT Short Term Goal 1 (Week 1): Pt will transfer to toilet/ BSC with min A  OT Short Term Goal 2 (Week 1): Pt will perform sit to stand from w/c with min A in prep for clothing management OT Short Term Goal 3 (Week 1): Pt will perform bed mobility to come to EOB with min A with extra time OT Short Term Goal 4 (Week 1): Pt will thread LB clothing with min  A  Skilled Therapeutic Interventions/Progress Updates:    Pt seen for ADL retraining on 3L of O2 with a focus on activity tolerance and functional mobility. Pt did not desat during the  OT session. She was able to complete all sit to stands from w/c and from [facility] with very close S and only one cue needed today for safe foot placement. Pt was able to maintain standing for a few minutes at a time with S while she completed LB self care. Pt's husband arrived and stated that he always helps her with donning pants over her feet, socks, and shoes. He also assists her into the walk in shower.  Pt demonstrated for her husband her transfer and standing skills. Pt's husband very pleased with progress. He stated she was on 2L at home. Pt taken to gym and O2 reduced to 2L. Pt engaged in several light AROM exercises for arms while seated using a dowel bar. Pt's O2 levels remained at 95% sat rate throughout exercises. Pt's PT arrived for next session and informed her that O2 levels reduced but may be too low for pt with walking.    Therapy Documentation Precautions:  Precautions Precautions: Fall Precaution Comments: edema in bilateral LEs - large TED hose in place Restrictions Weight Bearing Restrictions: No    Vital Signs: Oxygen Therapy SpO2: 97 % O2 Device: Nasal Cannula O2 Flow Rate (L/min): 3  L/min Pain: Pain Assessment Pain Assessment: No/denies pain ADL: ADL ADL Comments: see FIM  See FIM for current functional status  Therapy/Group: Individual Therapy  Meagan Campbell 08/16/2014, 11:59 AM

## 2014-08-17 ENCOUNTER — Inpatient Hospital Stay (HOSPITAL_COMMUNITY): Payer: Medicare Other | Admitting: Occupational Therapy

## 2014-08-17 ENCOUNTER — Inpatient Hospital Stay (HOSPITAL_COMMUNITY): Payer: Medicare Other

## 2014-08-17 ENCOUNTER — Inpatient Hospital Stay (HOSPITAL_COMMUNITY): Payer: Medicare Other | Admitting: Rehabilitation

## 2014-08-17 LAB — GLUCOSE, CAPILLARY
GLUCOSE-CAPILLARY: 105 mg/dL — AB (ref 70–99)
GLUCOSE-CAPILLARY: 146 mg/dL — AB (ref 70–99)
Glucose-Capillary: 130 mg/dL — ABNORMAL HIGH (ref 70–99)
Glucose-Capillary: 171 mg/dL — ABNORMAL HIGH (ref 70–99)

## 2014-08-17 NOTE — Progress Notes (Signed)
[place] PHYSICAL MEDICINE & REHABILITATION     PROGRESS NOTE    Subjective/Complaints: Slept ok No pains Review of systems: Negative except for weakness  Objective: Vital Signs: Blood pressure 144/55, pulse 71, temperature 98.8 F (37.1 C), temperature source Oral, resp. rate 19, weight 129.8 kg (286 lb 2.5 oz), SpO2 98 %. No results found.  Recent Labs  08/15/14 0625  WBC 4.4  HGB 9.8*  HCT 32.7*  PLT 179    Recent Labs  08/15/14 0625  NA 142  K 3.6  CL 97  GLUCOSE 153*  BUN 17  CREATININE 1.88*  CALCIUM 8.4   CBG (last 3)   Recent Labs  08/16/14 1635 08/16/14 2042 08/17/14 0652  GLUCAP 165* 156* 130*    Wt Readings from Last 3 Encounters:  08/17/14 129.8 kg (286 lb 2.5 oz)  08/11/14 127.007 kg (280 lb)  06/28/14 130.636 kg (288 lb)    Physical Exam:  Constitutional: She is oriented to person, place, and time.  74-year-old obese female in no distress HENT: oral mucosa moist, pink Head: Normocephalic.  Eyes: EOM are normal.  Neck: Normal range of motion. Neck supple. No thyromegaly present.  Cardiovascular:  Cardiac rate controlled without murmur Respiratory: no dyspnea, no wheezes or rales Lungs decreased breath sounds at the bases with good inspiratory effort--otherwise clear GI: Soft. Bowel sounds are normal. She exhibits no distension. Abdomen non tender Musculoskeletal:  +1 edema lower extremities. pain withpalpation along right axillary line/ribs. No bruising or swelling appreciated Neurological: She is alert and oriented to person, place, and time. . She exhibits normal muscle tone. Mild tremor MMT-- Right: 3+/5 deltoid, 4-/5 bicep, 4-/5 tricep, 4/5 wrist extension, 4+/5 hand intrinsics. 3-/5 hip flexor, 3/5 knee extension, 3/5 ankle dorsiflexion, 3/5 ankle plantarflexion. Left: 3+/5 deltoid, 4-/5 bicep, 4-/5 tricep, 4/5 wrist extension, 4+/5 hand intrinsics. 3-/5 hip flexor, 3/5 knee extension, 3/5 ankle dorsiflexion, 3/5  ankle plantarflexion. Mild reduction to light touch, unable to feel when she has her TED hose on, proprioception intact in the toes, no sensory deficitsin the upper limbs Skin: Skin is warm and dry.stasis dermatitis changes bilateral pretibial, no ulcerations noted             Psychiatric:  pleasant and cooperative.  Assessment/Plan: 1. Functional deficits secondary to critical illness myopathy which require 3+ hours per day of interdisciplinary therapy in a comprehensive inpatient rehab setting. Physiatrist is providing close team supervision and 24 hour management of active medical problems listed below. Physiatrist and rehab team continue to assess barriers to discharge/monitor patient progress toward functional and medical goals. Team conference today please see physician documentation under team conference tab, met with team face-to-face to discuss problems,progress, and goals. Formulized individual treatment plan based on medical history, underlying problem and comorbidities. FIM: FIM - Bathing Bathing Steps Patient Completed: Chest, Right Arm, Left Arm, Abdomen, Front perineal area, Buttocks, Right upper leg, Left upper leg Bathing: 4: Min-Patient completes 8-9 0f 10 parts or 75+ percent  FIM - Upper Body Dressing/Undressing Upper body dressing/undressing steps patient completed: Thread/unthread right sleeve of pullover shirt/dresss, Thread/unthread left sleeve of pullover shirt/dress, Put head through opening of pull over shirt/dress, Pull shirt over trunk Upper body dressing/undressing: 5: Set-up assist to: Obtain clothing/put away FIM - Lower Body Dressing/Undressing Lower body dressing/undressing steps patient completed: Pull underwear up/down, Pull pants up/down Lower body dressing/undressing: 2: Max-Patient completed 25-49% of tasks  FIM - Toileting Toileting steps completed by patient: Performs perineal hygiene Toileting Assistive Devices: Grab bar   or rail for support Toileting:  2: Max-Patient completed 1 of 3 steps  FIM - Radio producer Devices: Elevated toilet seat, Environmental consultant, Grab bars Toilet Transfers: 4-To toilet/BSC: Min A (steadying Pt. > 75%), 4-From toilet/BSC: Min A (steadying Pt. > 75%)  FIM - Bed/Chair Transfer Bed/Chair Transfer Assistive Devices: Walker, Arm rests Bed/Chair Transfer: 3: Bed > Chair or W/C: Mod A (lift or lower assist), 5: Chair or W/C > Bed: Supervision (verbal cues/safety issues)  FIM - Locomotion: Wheelchair Distance: 40 Locomotion: Wheelchair: 1: Travels less than 50 ft with minimal assistance (Pt.>75%) FIM - Locomotion: Ambulation Locomotion: Ambulation Assistive Devices: Administrator Ambulation/Gait Assistance: 4: Min assist Locomotion: Ambulation: 2: Travels 50 - 149 ft with minimal assistance (Pt.>75%)  Comprehension Comprehension Mode: Auditory Comprehension: 5-Follows basic conversation/direction: With extra time/assistive device  Expression Expression Mode: Verbal Expression: 5-Expresses basic needs/ideas: With no assist  Social Interaction Social Interaction: 5-Interacts appropriately 90% of the time - Needs monitoring or encouragement for participation or interaction.  Problem Solving Problem Solving: 5-Solves basic 90% of the time/requires cueing < 10% of the time  Memory Memory: 5-Recognizes or recalls 90% of the time/requires cueing < 10% of the time  Medical Problem List and Plan: 1. 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 2. DVT Prophylaxis/Anticoagulation: Lovenox.Monitor for any bleeding episodes 3. Pain Management: Tylenol as needed 4. COPD/OSA. continue nebulizers. Titrate oxygen. Followed by Dr. Joya Gaskins 5. Neuropsych: This patient is capable of making decisions on her own behalf.  -prn xanax for anxiety 6. Skin/Wound Care: Routine skin checks/need to check for any pressure ulcers, Lac Hydrin for dermatitis 7.  Fluids/Electrolytes/Nutrition: Strict I&O.Follow up labs 8. Chronic renal insufficiency. Baseline creatinine 2.45.   9. Diastolic congestive heart failure. Lasix 80 mg twice a day. Monitor for any signs of fluid overload. 10. Hypertension/atrial fibrillation. Amiodarone 200 mg daily, Imdur 30 mg daily. Coreg recently held. Cardiac rate control. Monitor with increased mobility. Echocardiogram remains pending from 08/11/2014 11. Hypothyroidism. Synthroid 12. Diabetes mellitus with peripheral neuropathy. Hemoglobin A1c 6.3.  Sliding scale insulin. Patient on 70/30 insulin 60 units twice a day prior to admission. Resume as tolerated 13. MRSA PCR screening positive. Contact precautions 14. Hyperlipidemia. Lipitor 15. RLS. Sinemet and Requip.Adjust dose 16. Nausea: ?related to feso4. Hold for now. Add protonix.  -moved bowels yesterday 17. Low grade temp: resolved, UA negative LOS (Days) 5 A FACE TO FACE EVALUATION WAS PERFORMED  Charlett Blake 08/17/2014 9:18 AM

## 2014-08-17 NOTE — Progress Notes (Signed)
Occupational Therapy Session Note  Patient Details  Name: Meagan Campbell MRN: [id number] Date of Birth: 06-03-1940  Today's Date: 08/17/2014 OT Individual Time: 0910-0950 OT Individual Time Calculation (min): 40 min    Short Term Goals: Week 1:  OT Short Term Goal 1 (Week 1): Pt will transfer to toilet/ BSC with min A  OT Short Term Goal 2 (Week 1): Pt will perform sit to stand from w/c with min A in prep for clothing management OT Short Term Goal 3 (Week 1): Pt will perform bed mobility to come to EOB with min A with extra time OT Short Term Goal 4 (Week 1): Pt will thread LB clothing with min  A  Skilled Therapeutic Interventions/Progress Updates:    Pt seen this session to facilitate ADL skills with safe functional mobility and activity tolerance.  Pt received sitting and w/c and requested to bathe at sink. She was able to stand up from her w/c to RW 4x this session with close S and no verbal cues needed for foot placement or forward leaning.  She continues to need A to don pants over feet as she has been needing this help at home for quite some time, but she pulls pants over her hips without A.  Pt stated that this morning she transferred well to [facility] but could not cleanse herself.  Pt needed a great deal of A to problem solve how to do this task more easily.  She needed cues that if she stays seated she cannot reach her hand around through the [facility] seat.  Pt is able to stand and wash her botttom. Pt practiced standing and taking a cloth to reach to her backside in which she was able to reach sufficiently. Pt continues to be fearful and anxious with standing and needs a great deal of encouragement to lean and reach.  Recommended pt use moist washcloths after cleansing with toilet paper to ensure a thorough cleanse. Pt tolerated standing for a few minutes at a time today.  Pt resting in w/c with leg rests on, call light and phone in reach.  Therapy Documentation Precautions:   Precautions Precautions: Fall Precaution Comments: edema in bilateral LEs - large TED hose in place Restrictions Weight Bearing Restrictions: No   Pain: Pain Assessment Pain Assessment: No/denies pain ADL: ADL ADL Comments: see FIM  See FIM for current functional status  Therapy/Group: Individual Therapy  Izic Stfort 08/17/2014, 10:35 AM

## 2014-08-17 NOTE — Progress Notes (Signed)
Physical Therapy Session Note  Patient Details  Name: Meagan Campbell MRN: [id number] Date of Birth: [birth year]  Today's Date: 08/17/2014 PT Individual Time: 1030-1115 PT Individual Time Calculation (min): 45 min   Short Term Goals: Week 1:  PT Short Term Goal 1 (Week 1): Pt will consistently sit to stand with 1 person assist and RW.  PT Short Term Goal 2 (Week 1): Pt will ambulate 1' with RW and 2nd assist for equipment management.  PT Short Term Goal 3 (Week 1): Pt will demonstrate bed mobility with HOB slightly elevated and bedrail (as at home) req min A.  PT Short Term Goal 4 (Week 1): Pt will self propel manual w/c >= 50' with B UEs req SBA.  PT Short Term Goal 5 (Week 1): Pt will tolerate 2 minutes of standing during activity without SaO2 dropping less than 90%.   Skilled Therapeutic Interventions/Progress Updates:   Pt received sitting in w/c in room, agreeable to therapy session.  Switched O2 tank since one in room was empty prior to leaving room.  Skilled session focused on gait training for improved quality as well as to increase endurance and activity tolerance, as well as seated nustep and sit<>stand transfers for endurance and BLEs/UEs strengthening.  Performed 3 bouts of gait (50' x 1, 45' x 2) with RW at min/guard A level for steadying with cues for sequencing and technique when standing.  Cues during gait for upright posture and pursed lip breathing.  Maintaining sats in upper 90's on 3LO2 throughout gait and therex activities.  Performed seated nustep x 7 mins at level 3 resistance with BUEs/LEs for overall strengthening and endurance.  Provided education on the BORG RPE scale and what level to stop and rest and what level to keep pushing into (13-14).  Pt verbalized understanding.  Ended session with 5 reps of sit<>stand with hand placement on thighs for increased quad/glute activation/strengthening.  Requires min to mod A, esp as she fatigues.  Facilitation for increased forward  weight shift.  Assisted pt back to w/c with steadying A.  Assisted pt to room and left in w/c with all needs in reach.   Therapy Documentation Precautions:  Precautions Precautions: Fall Precaution Comments: edema in bilateral LEs - large TED hose in place Restrictions Weight Bearing Restrictions: No  Pain: Pain Assessment Pain Assessment: No/denies pain Mobility:   Locomotion : Ambulation Ambulation/Gait Assistance: 4: Min guard   See FIM for current functional status  Therapy/Group: Individual Therapy  Denice Bors 08/17/2014, 11:59 AM

## 2014-08-17 NOTE — Progress Notes (Signed)
Physical Therapy Session Note  Patient Details  Name: Meagan Campbell MRN: [id number] Date of Birth: 10-May-1941  Today's Date: 08/17/2014 PT Individual Time: 1300-1405 PT Individual Time Calculation (min): 65 min   Short Term Goals: Week 1:  PT Short Term Goal 1 (Week 1): Pt will consistently sit to stand with 1 person assist and RW.  PT Short Term Goal 2 (Week 1): Pt will ambulate 31' with RW and 2nd assist for equipment management.  PT Short Term Goal 3 (Week 1): Pt will demonstrate bed mobility with HOB slightly elevated and bedrail (as at home) req min A.  PT Short Term Goal 4 (Week 1): Pt will self propel manual w/c >= 50' with B UEs req SBA.  PT Short Term Goal 5 (Week 1): Pt will tolerate 2 minutes of standing during activity without SaO2 dropping less than 90%.   Skilled Therapeutic Interventions/Progress Updates:  W/c propulsion using  bil UEs x 25' with supervision, limited by dyspnea.  Family ed with husband for simulated van transfer, using RW, min assist. Pt and husband report that she uses a 4 [employer] outdoors and in/out of van in order to navigate grass which is necessary from [place] to>< house. In the house, she uses RW.   Trial of gait with 4WW on level tile, which pt found unstable and uncomrfortable.  PT recommended that she not use 4 WW for this until checked out by HHPT.  Gait x 35' with RW, S/min guard assist, bringing O2 and w/c behind. 30 m walk test= 45 seconds.  Therapeutic exercise performed with LEs to increase strength for functional mobility: [place] A exs for fall prevention: standing wiith bil UE support10 x 1 calf raises (no heel elevation visualized), toe raises (no toe elevation noted), R and L hip abd.  Pt dyspneic with each 10 reps, but able to remain standing for 2 sets. She stated calves burned after attempting calf raises. Pt issued hand out.  PT returned pt to room; she was left sitting up in w/c with all needs within reach.    Therapy  Documentation Precautions:  Precautions Precautions: Fall Precaution Comments: edema in bilateral LEs - large TED hose in place Restrictions Weight Bearing Restrictions: No   Vital Signs: Oxygen Therapy sats 100%  On 3 L O2 via [place] HR 75 after exertion Pain: Pain Assessment Pain Score: 3  Pain Location: Calf Pain Orientation:  (bil , after ex) Pain Intervention(s):  (rest)   Locomotion : Ambulation Ambulation/Gait Assistance: 4: Min guard     See FIM for current functional status  Therapy/Group: Individual Therapy  Batsheva Stevick 08/17/2014, 3:54 PM

## 2014-08-18 ENCOUNTER — Inpatient Hospital Stay (HOSPITAL_COMMUNITY): Payer: Medicare Other | Admitting: Occupational Therapy

## 2014-08-18 ENCOUNTER — Encounter (HOSPITAL_COMMUNITY): Payer: Medicare Other

## 2014-08-18 ENCOUNTER — Inpatient Hospital Stay (HOSPITAL_COMMUNITY): Payer: Medicare Other

## 2014-08-18 DIAGNOSIS — F4322 Adjustment disorder with anxiety: Secondary | ICD-10-CM

## 2014-08-18 LAB — GLUCOSE, CAPILLARY
GLUCOSE-CAPILLARY: 126 mg/dL — AB (ref 70–99)
Glucose-Capillary: 164 mg/dL — ABNORMAL HIGH (ref 70–99)
Glucose-Capillary: 154 mg/dL — ABNORMAL HIGH (ref 70–99)
Glucose-Capillary: 144 mg/dL — ABNORMAL HIGH (ref 70–99)

## 2014-08-18 NOTE — Progress Notes (Signed)
Occupational Therapy Session Note  Patient Details  Name: Meagan Campbell MRN: [id number] Date of Birth: 1941-01-08  Today's Date: 08/18/2014 OT Individual Time: 3845-3646 OT Individual Time Calculation (min): 50 min    Short Term Goals: Week 1:  OT Short Term Goal 1 (Week 1): Pt will transfer to toilet/ BSC with min A  OT Short Term Goal 2 (Week 1): Pt will perform sit to stand from w/c with min A in prep for clothing management OT Short Term Goal 3 (Week 1): Pt will perform bed mobility to come to EOB with min A with extra time OT Short Term Goal 4 (Week 1): Pt will thread LB clothing with min  A  Skilled Therapeutic Interventions/Progress Updates:      Pt seen for BADL retraining of toileting, bathing at shower level, and dressing with a focus on activity tolerance and sit to stand.  Pt was able to use bed rails and elevated bed to get to EOB and stand with S and cues. She needed verbal cues to have bed elevated and to push of hand rail to stand. Pt has decreased problem solving and processing with functional movement strategies. She was then able to ambulate with RW to bathroom, toilet, walk to shower, shower (all body parts except feet) with S. She walked back to toilet to complete dressing with A. She then ambulated out to w/c in room. Pt stated she felt good and was moving smoothly throughout the session with no A needed for any sit><stand activity. Continue to recommend close S with ambulation and transfers for pt's safety.  Pt resting in w/c with nurse attending to her.  Therapy Documentation Precautions:  Precautions Precautions: Fall Precaution Comments: edema in bilateral LEs - large TED hose in place Restrictions Weight Bearing Restrictions: No  Pain: Pain Assessment Pain Assessment: No/denies pain ADL: ADL ADL Comments: see FIM  See FIM for current functional status  Therapy/Group: Individual Therapy  [place] 08/18/2014, 11:35 AM

## 2014-08-18 NOTE — Consult Note (Signed)
  NEUROCOGNITIVE STATUS EXAMINATION - [facility]   Mrs. Meagan Campbell is a 74 year old, right-handed woman, who was seen for a brief neurocognitive status examination to evaluate her emotional state and mental status in the setting of physical deconditioning.  According to her medical record, she was admitted on 07/19/14 with increased weight gain and shortness of breath.  She was treated with aggressive diuresis and she was placed on broad-spectrum antibiotics for treatment of UTI.  She developed increased shortness of breath on 07/27/14 with respiratory arrest and was intubated.  She was extubated on 08/04/14 but required reintubation and transfer to the current hospital on 08/05/14 for ongoing critical care.  She was last extubated on 08/09/14 with careful O2 titration.  She screened positive for MRSA and contact precautions were implemented.  She continues to exhibit endurance fatigue and inpatient rehabilitation was recommended.  Owing to observed flat affect on her inpatient stay, a neuropsychological consult was requested.    Emotional Functioning:  During the clinical interview, [name] denied symptoms of depression, including suicidal ideation.  She did endorse a few symptoms of anxiety, at a subclinical level.  She said that her worrisome thoughts surround frustration regarding inability to do the things that she used to be able to do (i.e. difficulty adjusting to illness).  Time was spent exploring coping strategies that she has utilized in the past to help her get through difficult situations.  She said that she has found success with relying on spirituality and in taking one step at a time; strategies that she is implementing currently.  She also stated that she feels as though she can (and will) talk with her children about her worries and stressors.  [name] denied problems with sleep and appetite.  Overall, she said that she feels that she is ready to  discharge and is looking forward to being back in her familiar environment.    [name] responses to self-report measures of mood symptoms were not suggestive of the presence of clinically significant depression or anxiety at this time.    Mental Status:  [name] total score on a very brief measure of mental status was not suggestive of the presence of significant cognitive disruption (MMSE-2 brief = 15/16).  She lost one point for failing to freely recall all of three previously studied words after a brief delay.  Subjectively, she denied noticing cognitive changes.    Impressions and Recommendations:  [name] score on a brief mental status measure was not suggestive of significant cognitive disruption.  From an emotional standpoint, she seems to be coping well.  Time was spent exploring coping strategies that she can use to continue to help her get through this difficult time and any tough transitions that may be in her future.  Risk for development of depression post-discharge was discussed and she seemed to understand the symptoms of more serious mood disruption.  She expressed willingness to inform her care team, should she notice increased depression or anxiety post-discharge.    DIAGNOSES:   Critical Illness Myopathy Chronic diastolic heart failure Adjustment disorder with anxious mood  Marlane Hatcher, Psy.D.  Clinical Neuropsychologist

## 2014-08-18 NOTE — Discharge Summary (Signed)
Discharge summary job [id number]

## 2014-08-18 NOTE — Discharge Summary (Signed)
NAMEZOHAR, LAING NO.:  [id number]  MEDICAL RECORD NO.:  [id number]  LOCATION:  4W05C                        FACILITY:  [facility]  PHYSICIAN:  Charlett Blake, M.D.DATE OF BIRTH:  Nov 20, 1940  DATE OF ADMISSION:  08/12/2014 DATE OF DISCHARGE:  08/19/2014                              DISCHARGE SUMMARY   DISCHARGE DIAGNOSES: 1. 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 2. Subcutaneous Lovenox for deep vein thrombosis prophylaxis. 3. Chronic obstructive pulmonary disease. 4. Chronic renal insufficiency.  His baseline creatinine is 8.25. 5. Diastolic congestive heart failure. 6. Hypertension with atrial fibrillation. 7. Hypothyroidism. 8. Diabetes mellitus with peripheral neuropathy. 9. Methicillin-resistant Staphylococcus aureus PCR screening positive. 10.Hyperlipidemia. 11.Restless legs syndrome.  HISTORY OF PRESENT ILLNESS:  This is a 74 year old right-handed female with history of COPD, on oxygen for many years, chronic renal insufficiency as well as diastolic congestive heart failure and atrial fibrillation.  Independent prior to admission, using a walker, living with her husband.  Presented to [facility] on July 19, 2014, with increased weight gain, shortness of breath.  Treated with aggressive diuresis as well as findings of urinary tract infection. Placed on broad-spectrum antibiotics.  Developed increased shortness of breath, respiratory arrest, intubated.  Slow progress, later extubated on August 04, 2014.  Transferred to [facility] on August 05, 2014, for ongoing care.  Hospital course, MRSA PCR screening positive. Careful titration of oxygen.  Subcutaneous Lovenox for DVT prophylaxis. Remained on amiodarone for atrial fibrillation.  Tolerating a regular diet.  Physical and occupational therapy ongoing.  The patient was admitted for comprehensive rehab program.  PAST MEDICAL  HISTORY:  See discharge diagnoses.  SOCIAL HISTORY:  Lives with spouse.  FUNCTIONAL HISTORY:  Prior to admission, independent, used a walker sometimes within the home.  Functional status upon admission to [facility] was +2 physical assist stand pivot transfers, endurance factors, mod max assist, activities of daily living.  PHYSICAL EXAMINATION:  VITAL SIGNS:  Blood pressure 141/39, pulse 77, temperature 98.4, respirations 20. GENERAL:  This was an alert female, oriented x3.  Fair awareness of her deficits. LUNGS:  Decreased breath sounds.  Clear to auscultation. CARDIAC:  Rate controlled. ABDOMEN:  Soft, nontender.  Good bowel sounds.  REHABILITATION HOSPITAL COURSE:  The patient was admitted to Inpatient Rehab Services with therapies initiated on a 3-hour daily basis, consisting of physical therapy, occupational therapy, and rehabilitation nursing.  The following issues were addressed during the patient's rehabilitation stay.  Pertaining to Ms. Mani's critical illness myopathy after respiratory failure, she continued to participate fully with therapies with increased strength and endurance.  Subcutaneous Lovenox for DVT prophylaxis.  No bleeding episodes.  She had a long history of COPD with obstructive sleep apnea.  She continued on nebulizer treatments.  She would follow up with Pulmonary Services. Chronic renal insufficiency, baseline 2.62, remained stable.  Close monitoring of electrolytes.  She exhibited no signs of fluid overload. She continued on Lasix as advised.  Cardiac rate controlled.  Diabetes mellitus, peripheral neuropathy.  Hemoglobin A1c 6.3.  Restless legs syndrome with Sinemet as well as ReQuip.  She remained on contact precautions for MRSA PCR screening being  positive.  She was afebrile. She had some mild nausea.  Her iron tablet was held with good results. The patient received weekly collaborative interdisciplinary team conferences to discuss estimated  length of stay, family teaching, and any barriers to discharge.  The patient ambulating 35 feet rolling walker, supervision to minimal assist with her oxygen in place. Sessions focused on endurance and activity modification.  Sit to stand with hand placements, requiring minimal to mod assist at times due to fatigue.  Activities of daily living.  She was able to complete all in a sit-to-stand position very close supervision.  Full family teaching completed with her husband.  Plan discharge home.  DISCHARGE MEDICATIONS: 1. Xanax 0.25 mg p.o. t.i.d. as needed. 2. Amiodarone 200 mg p.o. daily. 3. Aspirin 325 mg p.o. daily. 4. Lipitor 20 mg p.o. daily. 5. Sinemet 25/250 mg 1 tablet t.i.d. 6. Lasix 80 mg p.o. b.i.d. 7. NovoLog insulin 4 units t.i.d. 8. Imdur 30 mg p.o. daily. 9. Synthroid 75 mcg p.o. daily. 10.Lidoderm patch every 12 hours. 11.Protonix 40 mg p.o. daily. 12.ReQuip 4 mg p.o. q.h.s.  DIET:  Diabetic diet.  SPECIAL INSTRUCTIONS:  The patient would follow Dr. Alysia Penna at the outpatient rehab center as needed; Dr. Asencion Noble, Pulmonary Services, call for appointment; Dr. Nelda Bucks, medical management. The patient will continue oxygen therapy as directed.  Ongoing therapies had been arranged.     Lauraine Rinne, P.A.   ______________________________ Charlett Blake, M.D.    DA/MEDQ  D:  08/18/2014  T:  08/18/2014  Job:  [id number]  cc:   Dr. Noni Saupe, MD

## 2014-08-18 NOTE — Progress Notes (Addendum)
Occupational Therapy Session Note  Patient Details  Name: Meagan Campbell MRN: [id number] Date of Birth: Dec 30, 1940  Today's Date: 08/18/2014 OT Individual Time: 1500 -1525 OT Individual Time Calculation (min): 25 min    Short Term Goals: Week 1:  OT Short Term Goal 1 (Week 1): Pt will transfer to toilet/ BSC with min A  OT Short Term Goal 2 (Week 1): Pt will perform sit to stand from w/c with min A in prep for clothing management OT Short Term Goal 3 (Week 1): Pt will perform bed mobility to come to EOB with min A with extra time OT Short Term Goal 4 (Week 1): Pt will thread LB clothing with min  A  Skilled Therapeutic Interventions/Progress Updates:  Upon entering the room, pt seated in wheelchair with no c/o pain this session. STS from wheelchair x 3 reps with close supervision. Pt manipulating RW in tight space to turn around towards elevated bed where laundry awaited. Pt standing in order to engage in dynamic standing balance activity of utilizing B UEs in task. Pt able to use both hands unsupported to fold ~ 2 items at a time with close supervision - steady assist. After 2 items, pt holding onto walker for a few seconds before attempting again. Pt having increased anxiety about engaging in tasks without B UE support. Pt standing for ~ 11 minutes during session. Pt sitting in order to rest from fatigue. OT educated pt on energy conservation tasks with house hold activities, self care, and general principles. Pt verbalized understanding and pt with therapist discussed strategy for doctors appointments in order to conserve energy. Pt requesting to switch to recliner chair with steady assist for task. Pt seated in recliner chair, reclined back, with call bell and all needed items within reach upon exiting the room.   Therapy Documentation Precautions:  Precautions Precautions: Fall Precaution Comments: edema in bilateral LEs - large TED hose in place Restrictions Weight Bearing  Restrictions: No Vital Signs: Therapy Vitals Temp: 98.1 F (36.7 C) Temp Source: Oral Pulse Rate: 84 Resp: 18 BP: (!) 127/43 mmHg Patient Position (if appropriate): Sitting Oxygen Therapy SpO2: 97 % O2 Device: Nasal Cannula O2 Flow Rate (L/min): 3 L/min ADL: ADL ADL Comments: see FIM  See FIM for current functional status  Therapy/Group: Individual Therapy  Phineas Semen 08/18/2014, 3:45 PM

## 2014-08-18 NOTE — Progress Notes (Addendum)
[facility] PHYSICAL MEDICINE & REHABILITATION     PROGRESS NOTE    Subjective/Complaints: C/o tremor, had this at home Review of systems: Negative except for weakness and right rib pain Objective: Vital Signs: Blood pressure 153/42, pulse 77, temperature 98.3 F (36.8 C), temperature source Oral, resp. rate 18, weight 135.6 kg (298 lb 15.1 oz), SpO2 95 %. No results found. No results for input(s): WBC, HGB, HCT, PLT in the last 72 hours. No results for input(s): NA, K, CL, GLUCOSE, BUN, CREATININE, CALCIUM in the last 72 hours.  Invalid input(s): CO CBG (last 3)   Recent Labs  08/17/14 1645 08/17/14 2148 08/18/14 0650  GLUCAP 105* 146* 164*    Wt Readings from Last 3 Encounters:  08/18/14 135.6 kg (298 lb 15.1 oz)  08/11/14 127.007 kg (280 lb)  06/28/14 130.636 kg (288 lb)    Physical Exam:  Constitutional: She is oriented to person, place, and time.  74 year old obese female in no distress HENT: oral mucosa moist, pink Head: Normocephalic.  Eyes: EOM are normal.  Neck: Normal range of motion. Neck supple. No thyromegaly present.  Cardiovascular:  Cardiac rate controlled without murmur Respiratory: no dyspnea, no wheezes or rales Lungs decreased breath sounds at the bases with good inspiratory effort--otherwise clear GI: Soft. Bowel sounds are normal. She exhibits no distension. Abdomen non tender Musculoskeletal:  +1 edema lower extremities. pain withpalpation along right axillary line/ribs. No bruising or swelling appreciated Neurological: She is alert and oriented to person, place, and time. . She exhibits normal muscle tone. Mild tremor MMT-- Right: 3+/5 deltoid, 4-/5 bicep, 4-/5 tricep, 4/5 wrist extension, 4+/5 hand intrinsics. 3-/5 hip flexor, 3/5 knee extension, 3/5 ankle dorsiflexion, 3/5 ankle plantarflexion. Left: 3+/5 deltoid, 4-/5 bicep, 4-/5 tricep, 4/5 wrist extension, 4+/5 hand intrinsics. 3-/5 hip flexor, 3/5 knee extension, 3/5 ankle  dorsiflexion, 3/5 ankle plantarflexion.  Fine intention tremor no resting tremor , BUE, no head tremor Skin: Skin is warm and dry.stasis dermatitis changes bilateral pretibial, no ulcerations noted             Psychiatric: generally pleasant and cooperative. Appears anxious  Assessment/Plan: 1. Functional deficits secondary to critical illness myopathy which require 3+ hours per day of interdisciplinary therapy in a comprehensive inpatient rehab setting. Physiatrist is providing close team supervision and 24 hour management of active medical problems listed below. Physiatrist and rehab team continue to assess barriers to discharge/monitor patient progress toward functional and medical goals. FIM: FIM - Bathing Bathing Steps Patient Completed: Chest, Right Arm, Left Arm, Abdomen, Front perineal area, Buttocks, Right upper leg, Left upper leg Bathing: 4: Min-Patient completes 8-9 68f 10 parts or 75+ percent  FIM - Upper Body Dressing/Undressing Upper body dressing/undressing steps patient completed: Thread/unthread right sleeve of pullover shirt/dresss, Thread/unthread left sleeve of pullover shirt/dress, Put head through opening of pull over shirt/dress Upper body dressing/undressing: 4: Min-Patient completed 75 plus % of tasks FIM - Lower Body Dressing/Undressing Lower body dressing/undressing steps patient completed: Pull underwear up/down, Pull pants up/down Lower body dressing/undressing: 2: Max-Patient completed 25-49% of tasks  FIM - Toileting Toileting steps completed by patient: Performs perineal hygiene Toileting Assistive Devices: Grab bar or rail for support Toileting: 2: Max-Patient completed 1 of 3 steps  FIM - Radio producer Devices: Elevated toilet seat, Environmental consultant, Grab bars Toilet Transfers: 0-Activity did not occur  FIM - Control and instrumentation engineer Devices: Walker, Arm rests Bed/Chair Transfer: 4: Bed > Chair or W/C: Min A  (steadying Pt. >  75%), 4: Chair or W/C > Bed: Min A (steadying Pt. > 75%) (mat<>w/c)  FIM - Locomotion: Wheelchair Distance: 40 Locomotion: Wheelchair: 1: Travels less than 50 ft with supervision, cueing or coaxing FIM - Locomotion: Ambulation Locomotion: Ambulation Assistive Devices: Administrator Ambulation/Gait Assistance: 4: Min guard Locomotion: Ambulation: 1: Travels less than 50 ft with minimal assistance (Pt.>75%)  Comprehension Comprehension Mode: Auditory Comprehension: 5-Follows basic conversation/direction: With extra time/assistive device  Expression Expression Mode: Verbal Expression: 5-Expresses basic needs/ideas: With no assist  Social Interaction Social Interaction: 5-Interacts appropriately 90% of the time - Needs monitoring or encouragement for participation or interaction.  Problem Solving Problem Solving: 5-Solves basic 90% of the time/requires cueing < 10% of the time  Memory Memory: 5-Recognizes or recalls 90% of the time/requires cueing < 10% of the time  Medical Problem List and Plan: 1. 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 2. DVT Prophylaxis/Anticoagulation: Lovenox.Monitor for any bleeding episodes 3. Pain Management: Tylenol as needed 4. COPD/OSA. continue nebulizers. Titrate oxygen. Followed by Dr. Joya Gaskins 5. Neuropsych: This patient is capable of making decisions on her own behalf.  -prn xanax for anxiety 6. Skin/Wound Care: Routine skin checks/need to check for any pressure ulcers, Lac Hydrin for dermatitis 7. Fluids/Electrolytes/Nutrition: Strict I&O.Follow up labs 8. Chronic renal insufficiency. Baseline creatinine 0.04.   9. Diastolic congestive heart failure. Lasix 80 mg twice a day. Monitor for any signs of fluid overload. 10. Hypertension/atrial fibrillation. Amiodarone 200 mg daily, Imdur 30 mg daily. Coreg recently held. Cardiac rate control. Monitor with increased mobility. Echocardiogram  remains pending from 08/11/2014 11. Hypothyroidism. Synthroid 12. Diabetes mellitus with peripheral neuropathy. Hemoglobin A1c 6.3.  Sliding scale insulin. Patient on 70/30 insulin 60 units twice a day prior to admission. Resume as tolerated 13. MRSA PCR screening positive. Contact precautions 14. Hyperlipidemia. Lipitor 15. RLS. Sinemet and Requip.Adjust dose 16. Nausea: ?related to feso4. Hold for now. Add protonix.  -moved bowels yesterday 17. Tremor- likely essential, do not think this is parkinsonian, may be med related,  LOS (Days) 6 A FACE TO FACE EVALUATION WAS PERFORMED  KIRSTEINS,ANDREW E 08/18/2014 9:04 AM

## 2014-08-18 NOTE — Progress Notes (Signed)
Physical Therapy Discharge Summary  Patient Details  Name: Meagan Campbell MRN: [id number] Date of Birth: 09/17/1940  Today's Date: 08/18/2014    Patient has met 11 of 11 long term goals due to improved activity tolerance, improved balance, improved postural control, increased strength and increased range of motion.  Patient to discharge at an ambulatory level Supervision.   Patient's care partner is independent to provide the necessary set up assistance for O2 at discharge.  Reasons goals not met: pt continues to require O2 and set up assistance for it during transfers and standing balance.  Note that she also needs S for set up of bed in order to best get into standing from bed.   Recommendation:  Patient will benefit from ongoing skilled PT services in home health setting to continue to advance safe functional mobility, address ongoing impairments in activity tolerance, strength, functional mobility, gait, and minimize fall risk.  Equipment: No equipment provided; owns RW and w/c  Reasons for discharge: treatment goals met and discharge from hospital  Patient/family agrees with progress made and goals achieved: Yes  PT Discharge Precautions/Restrictions Precautions Precautions: Fall Precaution Comments: edema in bilateral LEs - large TED hose in place Restrictions Weight Bearing Restrictions: No Vital Signs Pulse Rate: 84 Patient Position (if appropriate): Sitting Oxygen Therapy SpO2: 95- 100 % O2 Device: Nasal Cannula O2 Flow Rate (L/min): 3 L/min Pain Pain Assessment Pain Assessment: No/denies pain     Cognition Overall Cognitive Status: Within Functional Limits for tasks assessed Arousal/Alertness: Awake/alert Orientation Level: Oriented X4 Comments: low confidence in abilities, despite improvement Sensation Sensation Light Touch: Impaired Detail Light Touch Impaired Details: Impaired RLE;Impaired LLE Stereognosis: Not tested Hot/Cold: Not  tested Proprioception: Appears Intact Additional Comments: partial numbness in B legs from peripheral neuropathy Coordination Gross Motor Movements are Fluid and Coordinated: Yes Fine Motor Movements are Fluid and Coordinated: Not tested Finger Nose Finger Test: intention tremor bilaterally, but wfl Heel Shin Test: limited by hip flexor weakness, but smooth motion and accurate direction Motor  Motor Motor: Other (comment) (generalized weakness) Motor - Skilled Clinical Observations: intention tremors in hands  Mobility Bed Mobility Bed Mobility:  (HOB raised, using rail,modified independent for all) Transfers Transfers: Yes (with RW) Sit to Stand: 5: Supervision Sit to Stand Details: Verbal cues for precautions/safety Stand Pivot Transfer Details (indicate cue type and reason): supervision sit-up for safety due to pt's need for O2 and needed cues for set up of bed for ease of getting into standing.  Locomotion  Ambulation Ambulation/Gait Assistance: 5: Supervision Ambulation Distance (Feet): 100 Feet Assistive device: Rolling walker Gait Gait: Yes Gait Pattern: Impaired Gait Pattern: Left foot flat;Right foot flat Gait velocity: 0.73'/second Stairs / Additional Locomotion Stairs: Yes Stairs Assistance: 5: Supervision Stair Management Technique: Two rails Number of Stairs: 7 Height of Stairs: 3 Ramp: 5: Psychiatric nurse: Yes Wheelchair Assistance: 5: Investment banker, operational Details: Verbal cues for Marketing executive: Both upper extremities Wheelchair Parts Management: Needs assistance Distance: 100  Trunk/Postural Assessment  Cervical Assessment Cervical Assessment: Exceptions to [facility] Cervical AROM Overall Cervical AROM: Deficits;Due to premorid status Overall Cervical AROM Comments: forward head Thoracic Assessment Thoracic Assessment: Exceptions to [facility] Thoracic AROM Overall Thoracic AROM: Deficits;Due to  premorid status Overall Thoracic AROM Comments: rounded shoulders Lumbar Assessment Lumbar Assessment: Exceptions to [facility] Lumbar AROM Overall Lumbar AROM: Deficits;Due to premorid status Overall Lumbar AROM Comments: posterior pelvic tilt Postural Control Postural Control: Deficits on evaluation Trunk Control: posterior lean in sit  Postural  Limitations: rigid spine in sit/stand - likely due to fear  Balance Balance Balance Assessed: Yes Static Sitting Balance Static Sitting - Level of Assistance: 6: Modified independent (Device/Increase time) Dynamic Sitting Balance Dynamic Sitting - Balance Support: During functional activity;Feet supported Dynamic Sitting - Level of Assistance: 6: Modified independent (Device/Increase time) Static Standing Balance Static Standing - Balance Support: Bilateral upper extremity supported;During functional activity Static Standing - Level of Assistance: 6: Modified independent (Device/Increase time) Dynamic Standing Balance Dynamic Standing - Balance Support: Bilateral upper extremity supported;During functional activity Dynamic Standing - Level of Assistance: 5: Stand by assistance Extremity Assessment      RLE Assessment RLE Assessment: Exceptions to WFL (moderate edema, non pitting; tender to touch) RLE Strength RLE Overall Strength: Deficits RLE Overall Strength Comments: grossly in sitting: 3- /5 but able to take some resistance; 4-/5 hip add, 4+/5 hip abd, 4-/5 knee ext and ankle DF LLE Assessment LLE Assessment: Exceptions to [facility] (moderate edema, non pitting, tender to touch) LLE Strength LLE Overall Strength Comments: grossly in sitting:hip flex 3-/5 but able to take some resistance; 4-/5 hip adduction, 4+/5 hip abduction, 4-/5 knee ext, ankle DF  See FIM for current functional status  COOK,CAROLINE 08/18/2014, 3:56 PM

## 2014-08-18 NOTE — Progress Notes (Signed)
Physical Therapy Session Note  Patient Details  Name: Meagan Campbell MRN: [id number] Date of Birth: 1940/10/02  Today's Date: 08/18/2014 PT Individual Time: 1110-1215 PT Individual Time Calculation (min): 65 min   Short Term Goals: Week 1:  PT Short Term Goal 1 (Week 1): Pt will consistently sit to stand with 1 person assist and RW.  PT Short Term Goal 2 (Week 1): Pt will ambulate 32' with RW and 2nd assist for equipment management.  PT Short Term Goal 3 (Week 1): Pt will demonstrate bed mobility with HOB slightly elevated and bedrail (as at home) req min A.  PT Short Term Goal 4 (Week 1): Pt will self propel manual w/c >= 50' with B UEs req SBA.  PT Short Term Goal 5 (Week 1): Pt will tolerate 2 minutes of standing during activity without SaO2 dropping less than 90%.      Skilled Therapeutic Interventions/Progress Updates:   Pt stated her calves were sore from exs yesterday (calf raises and toe raises in standing).  PT educated pt about muscle soreness of weak muscles, and recommended further strengthening/activity at home so that she will continue to improve in safe mobility.  Husband attended for further family ed for gait on level tile x 100' including through busy hallway to simulate community environment, up/down 8 stairs 2 rails, basic transfer, gait up/down ramp, all with supervision.   Husband stated that he can pull van up close to house and pt will not have to navigate the grass ; PT recommended this until HHPT assesses pt gait on grass.  W/c propulsion x 100' with supervision on level tile in hallway and in congested gym, using bil UEs, cues for technique for steering.      Pt returned to room; all needs left within reach, husband present. Therapy Documentation Precautions:  Precautions Precautions: Fall Precaution Comments: edema in bilateral LEs - large TED hose in place Restrictions Weight Bearing Restrictions: No   Vital Signs: Therapy Vitals Pulse Rate:  84 Patient Position (if appropriate): Sitting Oxygen Therapy SpO2: 97 % O2 Device: Nasal Cannula O2 Flow Rate (L/min): 3 L/min Pain: Pain Assessment Pain Assessment: No/denies pain  Locomotion : Ambulation Ambulation/Gait Assistance: 5: Supervision Ambulation Distance (Feet): 100 Feet Assistive device: Rolling walker Gait velocity: 0.73'/second Stairs / Additional Locomotion Stairs: Yes Stairs Assistance: 5: Supervision Stair Management Technique: Two rails Number of Stairs: 7 Height of Stairs: 3 Ramp: 5: Psychiatric nurse: Yes Wheelchair Assistance: 5: Investment banker, operational Details: Verbal cues for Marketing executive: Both upper extremities Wheelchair Parts Management: Needs assistance Distance: 100     See FIM for current functional status  Therapy/Group: Individual Therapy  Tianni Escamilla 08/18/2014, 4:13 PM

## 2014-08-18 NOTE — Progress Notes (Addendum)
Occupational Therapy Session Note  Patient Details  Name: Meagan Campbell MRN: [id number] Date of Birth: 1941-05-13  Today's Date: 08/18/2014 OT Individual Time: 1300-1345 OT Individual Time Calculation (min): 45 min   Skilled Therapeutic Interventions/Progress Updates:    Pt performed simulated shower transfer using wooden edge to step over.  Pt was able to navigate edge backwards with use of the RW for support.  Bedside commode placed in shower to simulate seat.  She was able to transfer both in and out of the shower with supervision.  Also worked on static standing balance and dynamic balance in the therapy gym.  She was able to stand with RW for support and toss horseshoes with close supervision.  Once all were thrown, had pt ambulate over and use the reacher to retrieve them.  She was able to utilize the reacher in the left hand and then take them off of the reacher and place on the front of the walker with the right hand.  She demonstrated increased dyspnea with each attempt and O2 sats at 94% on 2Ls nasal cannula.  HR increased to 104 with pt needing min instructional cueing for purse lip breathing techniques.    Therapy Documentation Precautions:  Precautions Precautions: Fall Precaution Comments: edema in bilateral LEs - large TED hose in place Restrictions Weight Bearing Restrictions: No  Pain: Pain Assessment Pain Assessment: No/denies pain ADL: See FIM for current functional status  Therapy/Group: Individual Therapy  Philo Kurtz OTR/L 08/18/2014, 4:13 PM

## 2014-08-18 NOTE — Discharge Instructions (Signed)
Inpatient Rehab Discharge Instructions  Mikeala Girdler Discharge date and time: No discharge date for patient encounter.   Activities/Precautions/ Functional Status: Activity: activity as tolerated Diet: diabetic diet Wound Care: none needed Functional status:  ___ No restrictions     ___ Walk up steps independently ___ 24/7 supervision/assistance   ___ Walk up steps with assistance ___ Intermittent supervision/assistance  ___ Bathe/dress independently ___ Walk with walker     ___ Bathe/dress with assistance ___ Walk Independently    ___ Shower independently ___ Walk with assistance    ___ Shower with assistance ___ No alcohol     ___ Return to work/school ________  COMMUNITY REFERRALS UPON DISCHARGE:   Home Health:   PT     RN  Agency:  [facility] Phone:  [phone]  Medical Equipment/Items Ordered:  None needed   Special Instructions: Continue oxygen therapy and nebulizer treatments as prior to admission   My questions have been answered and I understand these instructions. I will adhere to these goals and the provided educational materials after my discharge from the hospital.  Patient/Caregiver Signature _______________________________ Date __________  Clinician Signature _______________________________________ Date __________  Please bring this form and your medication list with you to all your follow-up doctor's appointments.

## 2014-08-19 ENCOUNTER — Inpatient Hospital Stay (HOSPITAL_COMMUNITY): Payer: Medicare Other | Admitting: Occupational Therapy

## 2014-08-19 ENCOUNTER — Inpatient Hospital Stay (HOSPITAL_COMMUNITY): Payer: Medicare Other | Admitting: Rehabilitation

## 2014-08-19 LAB — GLUCOSE, CAPILLARY
GLUCOSE-CAPILLARY: 146 mg/dL — AB (ref 70–99)
Glucose-Capillary: 194 mg/dL — ABNORMAL HIGH (ref 70–99)

## 2014-08-19 MED ORDER — LIDOCAINE 5 % EX PTCH: 1.0000 | MEDICATED_PATCH | CUTANEOUS | Status: AC

## 2014-08-19 MED ORDER — SIMVASTATIN 40 MG PO TABS: 40.0000 mg | ORAL_TABLET | Freq: Every day | ORAL | Status: AC

## 2014-08-19 MED ORDER — ISOSORBIDE MONONITRATE ER 30 MG PO TB24: 30.0000 mg | ORAL_TABLET | Freq: Every day | ORAL | Status: AC

## 2014-08-19 MED ORDER — AMMONIUM LACTATE 5 % EX LOTN: 1.0000 "application " | TOPICAL_LOTION | Freq: Two times a day (BID) | CUTANEOUS | Status: AC

## 2014-08-19 MED ORDER — INSULIN ASPART 100 UNIT/ML ~~LOC~~ SOLN: 4.0000 [IU] | Freq: Three times a day (TID) | SUBCUTANEOUS | Status: AC

## 2014-08-19 MED ORDER — FERROUS SULFATE 325 (65 FE) MG PO TABS: 325.0000 mg | ORAL_TABLET | Freq: Every day | ORAL | Status: AC

## 2014-08-19 MED ORDER — PANTOPRAZOLE SODIUM 40 MG PO TBEC: 40.0000 mg | DELAYED_RELEASE_TABLET | Freq: Every day | ORAL | Status: AC

## 2014-08-19 MED ORDER — AMIODARONE HCL 200 MG PO TABS: 200.0000 mg | ORAL_TABLET | Freq: Every day | ORAL | Status: AC

## 2014-08-19 MED ORDER — ALPRAZOLAM 0.25 MG PO TABS: 0.2500 mg | ORAL_TABLET | Freq: Three times a day (TID) | ORAL | Status: AC | PRN

## 2014-08-19 MED ORDER — FUROSEMIDE 80 MG PO TABS: 80.0000 mg | ORAL_TABLET | Freq: Two times a day (BID) | ORAL | Status: AC

## 2014-08-19 MED ORDER — ASPIRIN 325 MG PO TABS: 325.0000 mg | ORAL_TABLET | Freq: Every day | ORAL | Status: AC

## 2014-08-19 MED ORDER — LEVOTHYROXINE SODIUM 75 MCG PO TABS: 75.0000 ug | ORAL_TABLET | Freq: Every day | ORAL | Status: DC

## 2014-08-19 MED ORDER — CARBIDOPA-LEVODOPA 25-250 MG PO TABS: 1.0000 | ORAL_TABLET | Freq: Three times a day (TID) | ORAL | Status: AC

## 2014-08-19 NOTE — Patient Care Conference (Signed)
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 Adjustment disorder with anxious mood   . 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 Acute respiratory failure with hypercapnia 08/05/2014  . Respiratory failure 08/05/2014  . Chronic pain 06/28/2014  . DDD (degenerative disc disease), lumbar 06/28/2014  . Diabetes mellitus type 2, uncontrolled 06/28/2014  . Nerve root pain 06/28/2014  . Adiposity 06/28/2014  . Arthritis of knee, degenerative 06/28/2014  . DM (diabetes mellitus), type 2 with neurological complications 88/50/2774  . Obesity hypoventilation syndrome 08/11/2013  . Morbid obesity 08/11/2013  . CKD (chronic kidney disease) stage 4, GFR 15-29 ml/min 08/10/2013  . Anemia   . OSA (obstructive sleep apnea)   . Atrial fibrillation   . Chronic respiratory failure   . DM type 2, uncontrolled, with renal complications   . DDD (degenerative disc disease)   . Chronic diastolic heart failure   . Idiopathic peripheral neuropathy   . Restless legs   . Hyperlipidemia   . Proteinuria   . MRSA (methicillin resistant staph aureus) culture positive   . Hypertension     Expected Discharge Date: Expected Discharge Date: 08/19/14  Team Members Present: Physician leading conference: Dr. Alysia Penna Social Worker Present: Alfonse Alpers, LCSW Nurse Present: Heather Roberts, RN PT Present: Georjean Mode, PT;Blair  Hobble, PT OT Present: Simonne Come, OT SLP Present: Windell Moulding, SLP PPS Coordinator present : Daiva Nakayama, RN, CRRN     Current Status/Progress Goal Weekly Team Focus  Medical   resp status stable, anxiety still present during therapy  home d/c medically stable  improve endurance   Bowel/Bladder   Continent of bowel and bladder; LBM 3/23  Patient to clean herself, min assist  remain continent of bowel and bladder   Swallow/Nutrition/ Hydration             ADL's   min A BSC transfers with RW, max LB dressing (same as PLOF), min - mod bathing and toileting  supervision BSC transfers, supervision toileting, min A shower stall, min bathing, max LB dressing  ADL retraining, functional mobility training, activity tolerance, pt/family education   Mobility   Min/Mod A transfers, Min A gait 25-50' with RW, 40' WC prop with Min A,   Mod I transfers and bed mobility, S gait x100', S WC prop x100'  Activity tolerance, strengthening, functional balance, gait training   Communication             Safety/Cognition/ Behavioral Observations            Pain   c/o Right lower rib pain, Lidocaine patch for rib pain, 325-650mg  tylenol q 4hr prn  <2 on a 0-10 scale  assess pain q 4hr and medicate as needed   Skin   MASD to bottom, barrier cream applied, skin dry and flaky  remain free from additional infection/breakdown while on rehab  assess skin q shift    Rehab  Goals Patient on target to meet rehab goals: Yes Rehab Goals Revised: none *See Care Plan and progress notes for long and short-term goals.  Barriers to Discharge: see above    Possible Resolutions to Barriers:  work on sit to stand    Discharge Planning/Teaching Needs:  Pt to return to her home to the care of her husband.  Pt's husband has been with pt for therapy and has received education.   Team Discussion:  Pt's restless leg syndrome seems better and she is sleeping better.  Pt is on chronic O2 and is at 3L with therapies, but  2L at home.  Pt is continent, but wears briefs during the day in case of leaks.  Pt is at her baseline with OT and husband helps a lot.  Pt's anxiety limits her more than anything else.  Sit to stand has improved.  Pt to be allowed to stand to clean herself with toileting.  Pt has all needed DME, but will need HH f/u for PT.  Revisions to Treatment Plan:  none   Continued Need for Acute Rehabilitation Level of Care: The patient requires daily medical management by a physician with specialized training in physical medicine and rehabilitation for the following conditions: Daily direction of a multidisciplinary physical rehabilitation program to ensure safe treatment while eliciting the highest outcome that is of practical value to the patient.: Yes Daily medical management of patient stability for increased activity during participation in an intensive rehabilitation regime.: Yes Daily analysis of laboratory values and/or radiology reports with any subsequent need for medication adjustment of medical intervention for : Neurological problems;Other;Pulmonary problems  Mikle Sternberg, Silvestre Mesi 08/19/2014, 12:05 AM

## 2014-08-19 NOTE — Progress Notes (Signed)
Occupational Therapy Session Note  Patient Details  Name: Meagan Campbell MRN: [id number] Date of Birth: [birth year]  Today's Date: 08/19/2014 OT Individual Time: 1025-1110 OT Individual Time Calculation (min): 45 min    Short Term Goals: Week 1:  OT Short Term Goal 1 (Week 1): Pt will transfer to toilet/ BSC with min A  OT Short Term Goal 1 - Progress (Week 1): Met OT Short Term Goal 2 (Week 1): Pt will perform sit to stand from w/c with min A in prep for clothing management OT Short Term Goal 2 - Progress (Week 1): Met OT Short Term Goal 3 (Week 1): Pt will perform bed mobility to come to EOB with min A with extra time OT Short Term Goal 3 - Progress (Week 1): Met OT Short Term Goal 4 (Week 1): Pt will thread LB clothing with min  A OT Short Term Goal 4 - Progress (Week 1): Met  Skilled Therapeutic Interventions/Progress Updates:    Pt seen for ADL retraining with family education with pt's husband with a focus on education of activity tolerance and balance.  Pt completed bathing with min A, LB dressing with max A. She was able to sit>< stand numerous times with S and ambulated in and out of bathroom with RW with S to transfer in and out of shower and to toilet. Reviewed with pt and husband the need for her to try to be as active as possible at home with simple home management tasks and this is written on her discharge recommendations. Pt has all needed DME and is ready for discharge.  Therapy Documentation Precautions:  Precautions Precautions: Fall Precaution Comments: edema in bilateral LEs - large TED hose in place Restrictions Weight Bearing Restrictions: No Vital Signs: Oxygen Therapy O2 Device: Nasal Cannula O2 Flow Rate (L/min): 2.5 L/min Pain: Pain Assessment Pain Assessment: No/denies pain Pain Score: 0-No pain ADL: ADL ADL Comments: see FIM   See FIM for current functional status  Therapy/Group: Individual Therapy  Tresean Mattix 08/19/2014, 11:38 AM

## 2014-08-19 NOTE — Progress Notes (Signed)
Social Work Patient ID: Meagan Campbell, female   DOB: 1940/10/14, 74 y.o.   MRN: [id number]   CSW met with pt and her husband on 08-17-14 after team conference to update them on discussion.  Pt and husband were both pleased to learn that pt could go home on 08-19-14 after therapies.  Pt has all needed DME at home already.  CSW to arrange home health for PT through [facility].  No concerns/questions/needs noted at this time.  CSW will remain available to assist as needed.

## 2014-08-19 NOTE — Progress Notes (Signed)
Occupational Therapy Discharge Summary  Patient Details  Name: Meagan Campbell MRN: [id number] Date of Birth: 1940/06/23  Patient has met 8 of 8 long term goals due to improved activity tolerance, improved balance and ability to compensate for deficits.  Patient to discharge at overall Supervision level.  Patient's care partner is independent to provide the necessary physical and cognitive assistance at discharge.    Reasons goals not met: n/a  Recommendation:  No further OT services needed.  Equipment: No equipment provided  Reasons for discharge: treatment goals met  Patient/family agrees with progress made and goals achieved: Yes  OT Discharge Precautions/Restrictions  Precautions Precautions: Fall Precaution Comments: edema in bilateral LEs - large TED hose in place Restrictions Weight Bearing Restrictions: No   Vital Signs Oxygen Therapy O2 Device: Nasal Cannula O2 Flow Rate (L/min): 2.5 L/min Pain Pain Assessment Pain Assessment: No/denies pain Pain Score: 0-No pain ADL ADL Grooming: Supervision/safety Upper Body Bathing: Supervision/safety Lower Body Bathing: Minimal assistance Upper Body Dressing: Setup Lower Body Dressing: Maximal assistance Toileting: Supervision/safety Toilet Transfer: Close supervision Toilet Transfer Equipment: Geophysical data processor: Close supervision Youth worker: Shower seat with back ADL Comments: see FIM Vision/Perception  Vision- History Baseline Vision/History: Wears glasses Wears Glasses: At all times Patient Visual Report: No change from baseline Vision- Assessment Vision Assessment?: No apparent visual deficits Perception Comments: WFL  Cognition Overall Cognitive Status: Within Functional Limits for tasks assessed (slow processing) Orientation Level: Oriented X4 Sensation Sensation Light Touch: Impaired Detail Light Touch Impaired Details: Impaired RLE;Impaired LLE Stereognosis:  Appears Intact Hot/Cold: Appears Intact Proprioception: Appears Intact Proprioception Impaired Details: Impaired LLE;Impaired RLE Additional Comments: partial numbness in B legs from peripheral neuropathy Coordination Gross Motor Movements are Fluid and Coordinated: Yes Fine Motor Movements are Fluid and Coordinated: Yes Finger Nose Finger Test: intention tremor bilaterally, but wfl Motor  Motor Motor: Other (comment) (generalized weakness) Motor - Discharge Observations: intention tremors in hands Mobility    Supervision with basic transfers and ambulation (short distances) with RW Trunk/Postural Assessment  Cervical AROM Overall Cervical AROM Comments: forward head Thoracic AROM Overall Thoracic AROM Comments: rounded shoulders Lumbar AROM Overall Lumbar AROM Comments: posterior pelvic tilt Postural Control Trunk Control: posterior lean in sit  Postural Limitations: rigid spine in sit/stand - likely due to fear  Balance Static Sitting Balance Static Sitting - Level of Assistance: 6: Modified independent (Device/Increase time) Dynamic Sitting Balance Dynamic Sitting - Level of Assistance: 6: Modified independent (Device/Increase time) Static Standing Balance Static Standing - Level of Assistance: 6: Modified independent (Device/Increase time) Dynamic Standing Balance Dynamic Standing - Level of Assistance: 5: Stand by assistance Extremity/Trunk Assessment RUE AROM (degrees) RUE Overall AROM Comments: armflexion minimally limited; elbow & grip wfl RUE Strength RUE Overall Strength Comments: grossly 3+/5 LUE AROM (degrees) LUE Overall AROM Comments: arm flexion minimally limited; elbow & grip wfl LUE Strength LUE Overall Strength Comments: grossly 4/5  See FIM for current functional status  Temeca Somma 08/19/2014, 11:53 AM

## 2014-08-19 NOTE — Progress Notes (Signed)
Patient and caregiver received discharge instructions from Marlowe Shores, PA-C with verbal understanding. Patient discharged to home with caregiver and belongings.

## 2014-08-19 NOTE — Progress Notes (Signed)
Physical Therapy Session Note  Patient Details  Name: Meagan Campbell MRN: [id number] Date of Birth: [birth year]  Today's Date: 08/19/2014 PT Individual Time: 0830-0930 PT Individual Time Calculation (min): 60 min   Short Term Goals: Week 1:  PT Short Term Goal 1 (Week 1): Pt will consistently sit to stand with 1 person assist and RW.  PT Short Term Goal 2 (Week 1): Pt will ambulate 13' with RW and 2nd assist for equipment management.  PT Short Term Goal 3 (Week 1): Pt will demonstrate bed mobility with HOB slightly elevated and bedrail (as at home) req min A.  PT Short Term Goal 4 (Week 1): Pt will self propel manual w/c >= 50' with B UEs req SBA.  PT Short Term Goal 5 (Week 1): Pt will tolerate 2 minutes of standing during activity without SaO2 dropping less than 90%.   Skilled Therapeutic Interventions/Progress Updates:   Pt received sitting in w/c in room, agreeable to therapy session.  Skilled session focused on checking off remaining goals for safe D/C home with stand pivot transfers and focus on management of O2 lines, bed mobility in hospital bed and issuing and performing OTAGO HEP for strengthening and fall prevention.  RN in room to provide meds while PT got new O2 tank and educated and provided pt with D/C paper work and recommendations from PT and OT.  Pt verbalized understanding.  Prior to leaving room, discussed goals of mod I transfer and being able to manage O2 tubing during transfer.  She was able to perform all aspects of getting to the bed at mod I level and performed bed mobility at mod I level, however was unable to complete bed>chair at mod I level due to needing cues for bed adjustment in order to improve ability to stand.  Assisted pt back to w/c and back to therapy gym.  Performed all OTAGO HEP x 10 reps (see handout).  Provided extensive education on how often to perform, being compliant, charting her progress with exercises, and importance to decrease fall risk.  Ended  session with gait x 75' with single seated rest break with RW at S level on 2LO2 with sats remaining in 90's throughout.  Pt assisted back to room and left in w/c with all needs in reach.    Therapy Documentation Precautions:  Precautions Precautions: Fall Precaution Comments: edema in bilateral LEs - large TED hose in place Restrictions Weight Bearing Restrictions: No  Pain: Pt with no c/o pain during session.    Locomotion : Ambulation Ambulation/Gait Assistance: 5: Supervision Wheelchair Mobility Distance: 100   See FIM for current functional status  Therapy/Group: Individual Therapy  Denice Bors 08/19/2014, 9:29 AM

## 2014-08-19 NOTE — Progress Notes (Signed)
[facility] PHYSICAL MEDICINE & REHABILITATION     PROGRESS NOTE    Subjective/Complaints: No issues overnite, breathing ok Review of systems: Negative except for weakness and right rib pain Objective: Vital Signs: Blood pressure 150/40, pulse 81, temperature 98.4 F (36.9 C), temperature source Oral, resp. rate 18, weight 139.5 kg (307 lb 8.7 oz), SpO2 90 %. No results found. No results for input(s): WBC, HGB, HCT, PLT in the last 72 hours. No results for input(s): NA, K, CL, GLUCOSE, BUN, CREATININE, CALCIUM in the last 72 hours.  Invalid input(s): CO CBG (last 3)   Recent Labs  08/18/14 1624 08/18/14 2119 08/19/14 0649  GLUCAP 144* 126* 146*    Wt Readings from Last 3 Encounters:  08/19/14 139.5 kg (307 lb 8.7 oz)  08/11/14 127.007 kg (280 lb)  06/28/14 130.636 kg (288 lb)    Physical Exam:  Constitutional: She is oriented to person, place, and time.  74 year old obese female in no distress HENT: oral mucosa moist, pink Head: Normocephalic.  Eyes: EOM are normal.  Neck: Normal range of motion. Neck supple. No thyromegaly present.  Cardiovascular:  Cardiac rate controlled without murmur Respiratory: no dyspnea, no wheezes or rales Lungs decreased breath sounds at the bases with good inspiratory effort--otherwise clear GI: Soft. Bowel sounds are normal. She exhibits no distension. Abdomen non tender Musculoskeletal:  +1 edema lower extremities. pain withpalpation along right axillary line/ribs. No bruising or swelling appreciated Neurological: She is alert and oriented to person, place, and time. . She exhibits normal muscle tone. Mild tremor MMT-- Right: 3+/5 deltoid, 4-/5 bicep, 4-/5 tricep, 4/5 wrist extension, 4+/5 hand intrinsics. 3-/5 hip flexor, 3/5 knee extension, 3/5 ankle dorsiflexion, 3/5 ankle plantarflexion. Left: 3+/5 deltoid, 4-/5 bicep, 4-/5 tricep, 4/5 wrist extension, 4+/5 hand intrinsics. 3-/5 hip flexor, 3/5 knee extension, 3/5  ankle dorsiflexion, 3/5 ankle plantarflexion.   Fine intention tremor no resting tremor , BUE, no head tremor Skin: Skin is warm and dry.stasis dermatitis changes bilateral pretibial, no ulcerations noted             Psychiatric: generally pleasant and cooperative. Appears anxious  Assessment/Plan: 1. Functional deficits secondary to critical illness myopathy Stable for D/C today F/u PCP in 1-2 weeks F/u PM&R 3 weeks See D/C summary See D/C instructionsFIM: FIM - Bathing Bathing Steps Patient Completed: Chest, Right Arm, Left Arm, Abdomen, Front perineal area, Buttocks, Right upper leg, Left upper leg Bathing: 4: Min-Patient completes 8-9 62f 10 parts or 75+ percent  FIM - Upper Body Dressing/Undressing Upper body dressing/undressing steps patient completed: Thread/unthread right sleeve of pullover shirt/dresss, Thread/unthread left sleeve of pullover shirt/dress, Put head through opening of pull over shirt/dress, Pull shirt over trunk Upper body dressing/undressing: 4: Min-Patient completed 75 plus % of tasks FIM - Lower Body Dressing/Undressing Lower body dressing/undressing steps patient completed: Pull underwear up/down, Pull pants up/down Lower body dressing/undressing: 2: Max-Patient completed 25-49% of tasks  FIM - Toileting Toileting steps completed by patient: Performs perineal hygiene, Adjust clothing prior to toileting, Adjust clothing after toileting Toileting Assistive Devices: Grab bar or rail for support Toileting: 5: Supervision: Safety issues/verbal cues  FIM - Radio producer Devices: Elevated toilet seat, Environmental consultant, Grab bars Toilet Transfers: 5-To toilet/BSC: Supervision (verbal cues/safety issues), 5-From toilet/BSC: Supervision (verbal cues/safety issues)  FIM - Control and instrumentation engineer Devices: Bed rails, HOB elevated, Walker Bed/Chair Transfer: 6: Supine > Sit: No assist, 5: Bed > Chair or W/C: Supervision (verbal  cues/safety issues), 5: Chair or  W/C > Bed: Supervision (verbal cues/safety issues)  FIM - Locomotion: Wheelchair Distance: 100 Locomotion: Wheelchair: 5: Travels 150 ft or more: maneuvers on rugs and over door sills with supervision, cueing or coaxing FIM - Locomotion: Ambulation Locomotion: Ambulation Assistive Devices: Administrator Ambulation/Gait Assistance: 5: Supervision Locomotion: Ambulation: 2: Travels 50 - 149 ft with supervision/safety issues  Comprehension Comprehension Mode: Auditory Comprehension: 5-Follows basic conversation/direction: With extra time/assistive device  Expression Expression Mode: Nonverbal Expression: 5-Expresses basic needs/ideas: With no assist  Social Interaction Social Interaction: 5-Interacts appropriately 90% of the time - Needs monitoring or encouragement for participation or interaction.  Problem Solving Problem Solving: 5-Solves basic 90% of the time/requires cueing < 10% of the time  Memory Memory: 5-Recognizes or recalls 90% of the time/requires cueing < 10% of the time  Medical Problem List and Plan: 1. 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 2. DVT Prophylaxis/Anticoagulation: Lovenox.Monitor for any bleeding episodes 3. Pain Management: Tylenol as needed 4. COPD/OSA. continue nebulizers. Titrate oxygen. Followed by Dr. Joya Gaskins 5. Neuropsych: This patient is capable of making decisions on her own behalf.  -prn xanax for anxiety 6. Skin/Wound Care: Routine skin checks/need to check for any pressure ulcers, Lac Hydrin for dermatitis 7. Fluids/Electrolytes/Nutrition: Strict I&O.Follow up labs 8. Chronic renal insufficiency. Baseline creatinine 2.24.   9. Diastolic congestive heart failure. Lasix 80 mg twice a day. Monitor for any signs of fluid overload. 10. Hypertension/atrial fibrillation. Amiodarone 200 mg daily, Imdur 30 mg daily. Coreg recently held. Cardiac rate control. Monitor  with increased mobility. Echocardiogram remains pending from 08/11/2014 11. Hypothyroidism. Synthroid 12. Diabetes mellitus with peripheral neuropathy. Hemoglobin A1c 6.3.  Sliding scale insulin. Patient on 70/30 insulin 60 units twice a day prior to admission. Resume as tolerated 13. MRSA PCR screening positive. Contact precautions 14. Hyperlipidemia. Lipitor 15. RLS. Sinemet and Requip.Adjust dose 16. Nausea: ?related to feso4. Hold for now. Add protonix.  -moved bowels yesterday 17. Tremor- likely essential, do not think this is parkinsonian, may be med related,  LOS (Days) 7 A FACE TO FACE EVALUATION WAS PERFORMED  KIRSTEINS,ANDREW E 08/19/2014 8:11 AM

## 2014-08-22 DIAGNOSIS — I10 Essential (primary) hypertension: Secondary | ICD-10-CM | POA: Diagnosis not present

## 2014-08-22 DIAGNOSIS — J441 Chronic obstructive pulmonary disease with (acute) exacerbation: Secondary | ICD-10-CM | POA: Diagnosis not present

## 2014-08-22 DIAGNOSIS — E119 Type 2 diabetes mellitus without complications: Secondary | ICD-10-CM | POA: Diagnosis not present

## 2014-08-22 DIAGNOSIS — Z794 Long term (current) use of insulin: Secondary | ICD-10-CM | POA: Diagnosis not present

## 2014-08-22 DIAGNOSIS — I509 Heart failure, unspecified: Secondary | ICD-10-CM | POA: Diagnosis not present

## 2014-08-22 NOTE — Progress Notes (Signed)
Social Work Discharge Note  The overall goal for the admission was met for:   Discharge location: Yes - home  Length of Stay: Yes - 7 days  Discharge activity level: Yes - supervision with some minimal assistance  Home/community participation: Yes  Services provided included: MD, RD, PT, OT, RN, Pharmacy, [place]: Private Insurance: [facility] Medicare  Follow-up services arranged: Home Health: RN/PT and Patient/Family request agency HH: [facility]  Comments (or additional information):  Pt to return to her home with her husband and extended family for support, as needed.  Pt already has oxygen provided by [employer] and all needed DME at home.  Patient/Family verbalized understanding of follow-up arrangements: Yes  Individual responsible for coordination of the follow-up plan: pt's husband  Confirmed correct DME delivered: Trey Sailors 08/22/2014    Mckynlee Luse, Silvestre Mesi

## 2014-08-24 DIAGNOSIS — Z794 Long term (current) use of insulin: Secondary | ICD-10-CM | POA: Diagnosis not present

## 2014-08-24 DIAGNOSIS — I10 Essential (primary) hypertension: Secondary | ICD-10-CM | POA: Diagnosis not present

## 2014-08-24 DIAGNOSIS — J441 Chronic obstructive pulmonary disease with (acute) exacerbation: Secondary | ICD-10-CM | POA: Diagnosis not present

## 2014-08-24 DIAGNOSIS — I509 Heart failure, unspecified: Secondary | ICD-10-CM | POA: Diagnosis not present

## 2014-08-24 DIAGNOSIS — E119 Type 2 diabetes mellitus without complications: Secondary | ICD-10-CM | POA: Diagnosis not present

## 2014-08-25 DIAGNOSIS — I480 Paroxysmal atrial fibrillation: Secondary | ICD-10-CM | POA: Diagnosis not present

## 2014-08-25 DIAGNOSIS — J441 Chronic obstructive pulmonary disease with (acute) exacerbation: Secondary | ICD-10-CM | POA: Diagnosis not present

## 2014-08-25 DIAGNOSIS — E662 Morbid (severe) obesity with alveolar hypoventilation: Secondary | ICD-10-CM | POA: Diagnosis not present

## 2014-08-25 DIAGNOSIS — E039 Hypothyroidism, unspecified: Secondary | ICD-10-CM | POA: Diagnosis not present

## 2014-08-25 DIAGNOSIS — I10 Essential (primary) hypertension: Secondary | ICD-10-CM | POA: Diagnosis not present

## 2014-08-25 DIAGNOSIS — I5023 Acute on chronic systolic (congestive) heart failure: Secondary | ICD-10-CM | POA: Diagnosis not present

## 2014-08-25 DIAGNOSIS — E538 Deficiency of other specified B group vitamins: Secondary | ICD-10-CM | POA: Diagnosis not present

## 2014-08-25 DIAGNOSIS — Z794 Long term (current) use of insulin: Secondary | ICD-10-CM | POA: Diagnosis not present

## 2014-08-25 DIAGNOSIS — N184 Chronic kidney disease, stage 4 (severe): Secondary | ICD-10-CM | POA: Diagnosis not present

## 2014-08-25 DIAGNOSIS — J9621 Acute and chronic respiratory failure with hypoxia: Secondary | ICD-10-CM | POA: Diagnosis not present

## 2014-08-25 DIAGNOSIS — E119 Type 2 diabetes mellitus without complications: Secondary | ICD-10-CM | POA: Diagnosis not present

## 2014-08-25 DIAGNOSIS — I509 Heart failure, unspecified: Secondary | ICD-10-CM | POA: Diagnosis not present

## 2014-08-26 DIAGNOSIS — I509 Heart failure, unspecified: Secondary | ICD-10-CM | POA: Diagnosis not present

## 2014-08-26 DIAGNOSIS — I10 Essential (primary) hypertension: Secondary | ICD-10-CM | POA: Diagnosis not present

## 2014-08-26 DIAGNOSIS — J449 Chronic obstructive pulmonary disease, unspecified: Secondary | ICD-10-CM | POA: Diagnosis not present

## 2014-08-26 DIAGNOSIS — Z794 Long term (current) use of insulin: Secondary | ICD-10-CM | POA: Diagnosis not present

## 2014-08-26 DIAGNOSIS — J441 Chronic obstructive pulmonary disease with (acute) exacerbation: Secondary | ICD-10-CM | POA: Diagnosis not present

## 2014-08-26 DIAGNOSIS — E119 Type 2 diabetes mellitus without complications: Secondary | ICD-10-CM | POA: Diagnosis not present

## 2014-08-29 DIAGNOSIS — E119 Type 2 diabetes mellitus without complications: Secondary | ICD-10-CM | POA: Diagnosis not present

## 2014-08-29 DIAGNOSIS — Z794 Long term (current) use of insulin: Secondary | ICD-10-CM | POA: Diagnosis not present

## 2014-08-29 DIAGNOSIS — I509 Heart failure, unspecified: Secondary | ICD-10-CM | POA: Diagnosis not present

## 2014-08-29 DIAGNOSIS — I10 Essential (primary) hypertension: Secondary | ICD-10-CM | POA: Diagnosis not present

## 2014-08-29 DIAGNOSIS — J441 Chronic obstructive pulmonary disease with (acute) exacerbation: Secondary | ICD-10-CM | POA: Diagnosis not present

## 2014-08-30 DIAGNOSIS — E119 Type 2 diabetes mellitus without complications: Secondary | ICD-10-CM | POA: Diagnosis not present

## 2014-08-30 DIAGNOSIS — I509 Heart failure, unspecified: Secondary | ICD-10-CM | POA: Diagnosis not present

## 2014-08-30 DIAGNOSIS — Z794 Long term (current) use of insulin: Secondary | ICD-10-CM | POA: Diagnosis not present

## 2014-08-30 DIAGNOSIS — J441 Chronic obstructive pulmonary disease with (acute) exacerbation: Secondary | ICD-10-CM | POA: Diagnosis not present

## 2014-08-30 DIAGNOSIS — I10 Essential (primary) hypertension: Secondary | ICD-10-CM | POA: Diagnosis not present

## 2014-08-31 DIAGNOSIS — I10 Essential (primary) hypertension: Secondary | ICD-10-CM | POA: Diagnosis not present

## 2014-08-31 DIAGNOSIS — I509 Heart failure, unspecified: Secondary | ICD-10-CM | POA: Diagnosis not present

## 2014-08-31 DIAGNOSIS — E119 Type 2 diabetes mellitus without complications: Secondary | ICD-10-CM | POA: Diagnosis not present

## 2014-08-31 DIAGNOSIS — Z794 Long term (current) use of insulin: Secondary | ICD-10-CM | POA: Diagnosis not present

## 2014-08-31 DIAGNOSIS — J441 Chronic obstructive pulmonary disease with (acute) exacerbation: Secondary | ICD-10-CM | POA: Diagnosis not present

## 2014-09-01 DIAGNOSIS — Z794 Long term (current) use of insulin: Secondary | ICD-10-CM | POA: Diagnosis not present

## 2014-09-01 DIAGNOSIS — J449 Chronic obstructive pulmonary disease, unspecified: Secondary | ICD-10-CM | POA: Diagnosis not present

## 2014-09-01 DIAGNOSIS — I10 Essential (primary) hypertension: Secondary | ICD-10-CM | POA: Diagnosis not present

## 2014-09-01 DIAGNOSIS — J9600 Acute respiratory failure, unspecified whether with hypoxia or hypercapnia: Secondary | ICD-10-CM | POA: Diagnosis not present

## 2014-09-01 DIAGNOSIS — I509 Heart failure, unspecified: Secondary | ICD-10-CM | POA: Diagnosis not present

## 2014-09-01 DIAGNOSIS — E119 Type 2 diabetes mellitus without complications: Secondary | ICD-10-CM | POA: Diagnosis not present

## 2014-09-01 DIAGNOSIS — J441 Chronic obstructive pulmonary disease with (acute) exacerbation: Secondary | ICD-10-CM | POA: Diagnosis not present

## 2014-09-02 DIAGNOSIS — E119 Type 2 diabetes mellitus without complications: Secondary | ICD-10-CM | POA: Diagnosis not present

## 2014-09-02 DIAGNOSIS — I509 Heart failure, unspecified: Secondary | ICD-10-CM | POA: Diagnosis not present

## 2014-09-02 DIAGNOSIS — Z794 Long term (current) use of insulin: Secondary | ICD-10-CM | POA: Diagnosis not present

## 2014-09-02 DIAGNOSIS — J441 Chronic obstructive pulmonary disease with (acute) exacerbation: Secondary | ICD-10-CM | POA: Diagnosis not present

## 2014-09-02 DIAGNOSIS — I10 Essential (primary) hypertension: Secondary | ICD-10-CM | POA: Diagnosis not present

## 2014-09-05 DIAGNOSIS — J441 Chronic obstructive pulmonary disease with (acute) exacerbation: Secondary | ICD-10-CM | POA: Diagnosis not present

## 2014-09-05 DIAGNOSIS — E119 Type 2 diabetes mellitus without complications: Secondary | ICD-10-CM | POA: Diagnosis not present

## 2014-09-05 DIAGNOSIS — I509 Heart failure, unspecified: Secondary | ICD-10-CM | POA: Diagnosis not present

## 2014-09-05 DIAGNOSIS — Z794 Long term (current) use of insulin: Secondary | ICD-10-CM | POA: Diagnosis not present

## 2014-09-05 DIAGNOSIS — I10 Essential (primary) hypertension: Secondary | ICD-10-CM | POA: Diagnosis not present

## 2014-09-07 DIAGNOSIS — Z794 Long term (current) use of insulin: Secondary | ICD-10-CM | POA: Diagnosis not present

## 2014-09-07 DIAGNOSIS — I509 Heart failure, unspecified: Secondary | ICD-10-CM | POA: Diagnosis not present

## 2014-09-07 DIAGNOSIS — I10 Essential (primary) hypertension: Secondary | ICD-10-CM | POA: Diagnosis not present

## 2014-09-07 DIAGNOSIS — E119 Type 2 diabetes mellitus without complications: Secondary | ICD-10-CM | POA: Diagnosis not present

## 2014-09-07 DIAGNOSIS — J441 Chronic obstructive pulmonary disease with (acute) exacerbation: Secondary | ICD-10-CM | POA: Diagnosis not present

## 2014-09-08 DIAGNOSIS — E119 Type 2 diabetes mellitus without complications: Secondary | ICD-10-CM | POA: Diagnosis not present

## 2014-09-08 DIAGNOSIS — J441 Chronic obstructive pulmonary disease with (acute) exacerbation: Secondary | ICD-10-CM | POA: Diagnosis not present

## 2014-09-08 DIAGNOSIS — Z794 Long term (current) use of insulin: Secondary | ICD-10-CM | POA: Diagnosis not present

## 2014-09-08 DIAGNOSIS — I509 Heart failure, unspecified: Secondary | ICD-10-CM | POA: Diagnosis not present

## 2014-09-08 DIAGNOSIS — I10 Essential (primary) hypertension: Secondary | ICD-10-CM | POA: Diagnosis not present

## 2014-09-09 DIAGNOSIS — I10 Essential (primary) hypertension: Secondary | ICD-10-CM | POA: Diagnosis not present

## 2014-09-09 DIAGNOSIS — I509 Heart failure, unspecified: Secondary | ICD-10-CM | POA: Diagnosis not present

## 2014-09-09 DIAGNOSIS — J441 Chronic obstructive pulmonary disease with (acute) exacerbation: Secondary | ICD-10-CM | POA: Diagnosis not present

## 2014-09-09 DIAGNOSIS — Z794 Long term (current) use of insulin: Secondary | ICD-10-CM | POA: Diagnosis not present

## 2014-09-09 DIAGNOSIS — E119 Type 2 diabetes mellitus without complications: Secondary | ICD-10-CM | POA: Diagnosis not present

## 2014-09-12 DIAGNOSIS — Z794 Long term (current) use of insulin: Secondary | ICD-10-CM | POA: Diagnosis not present

## 2014-09-12 DIAGNOSIS — J441 Chronic obstructive pulmonary disease with (acute) exacerbation: Secondary | ICD-10-CM | POA: Diagnosis not present

## 2014-09-12 DIAGNOSIS — E119 Type 2 diabetes mellitus without complications: Secondary | ICD-10-CM | POA: Diagnosis not present

## 2014-09-12 DIAGNOSIS — I509 Heart failure, unspecified: Secondary | ICD-10-CM | POA: Diagnosis not present

## 2014-09-12 DIAGNOSIS — I10 Essential (primary) hypertension: Secondary | ICD-10-CM | POA: Diagnosis not present

## 2014-09-14 DIAGNOSIS — Z794 Long term (current) use of insulin: Secondary | ICD-10-CM | POA: Diagnosis not present

## 2014-09-14 DIAGNOSIS — I10 Essential (primary) hypertension: Secondary | ICD-10-CM | POA: Diagnosis not present

## 2014-09-14 DIAGNOSIS — J441 Chronic obstructive pulmonary disease with (acute) exacerbation: Secondary | ICD-10-CM | POA: Diagnosis not present

## 2014-09-14 DIAGNOSIS — I509 Heart failure, unspecified: Secondary | ICD-10-CM | POA: Diagnosis not present

## 2014-09-14 DIAGNOSIS — E119 Type 2 diabetes mellitus without complications: Secondary | ICD-10-CM | POA: Diagnosis not present

## 2014-09-15 DIAGNOSIS — I10 Essential (primary) hypertension: Secondary | ICD-10-CM | POA: Diagnosis not present

## 2014-09-15 DIAGNOSIS — Z794 Long term (current) use of insulin: Secondary | ICD-10-CM | POA: Diagnosis not present

## 2014-09-15 DIAGNOSIS — J441 Chronic obstructive pulmonary disease with (acute) exacerbation: Secondary | ICD-10-CM | POA: Diagnosis not present

## 2014-09-15 DIAGNOSIS — I509 Heart failure, unspecified: Secondary | ICD-10-CM | POA: Diagnosis not present

## 2014-09-15 DIAGNOSIS — E119 Type 2 diabetes mellitus without complications: Secondary | ICD-10-CM | POA: Diagnosis not present

## 2014-09-16 DIAGNOSIS — I509 Heart failure, unspecified: Secondary | ICD-10-CM | POA: Diagnosis not present

## 2014-09-16 DIAGNOSIS — E119 Type 2 diabetes mellitus without complications: Secondary | ICD-10-CM | POA: Diagnosis not present

## 2014-09-16 DIAGNOSIS — J441 Chronic obstructive pulmonary disease with (acute) exacerbation: Secondary | ICD-10-CM | POA: Diagnosis not present

## 2014-09-16 DIAGNOSIS — I10 Essential (primary) hypertension: Secondary | ICD-10-CM | POA: Diagnosis not present

## 2014-09-16 DIAGNOSIS — Z794 Long term (current) use of insulin: Secondary | ICD-10-CM | POA: Diagnosis not present

## 2014-09-19 DIAGNOSIS — Z6841 Body Mass Index (BMI) 40.0 and over, adult: Secondary | ICD-10-CM | POA: Diagnosis not present

## 2014-09-19 DIAGNOSIS — I5023 Acute on chronic systolic (congestive) heart failure: Secondary | ICD-10-CM | POA: Diagnosis not present

## 2014-09-19 DIAGNOSIS — L03011 Cellulitis of right finger: Secondary | ICD-10-CM | POA: Diagnosis not present

## 2014-09-20 DIAGNOSIS — J441 Chronic obstructive pulmonary disease with (acute) exacerbation: Secondary | ICD-10-CM | POA: Diagnosis not present

## 2014-09-20 DIAGNOSIS — E119 Type 2 diabetes mellitus without complications: Secondary | ICD-10-CM | POA: Diagnosis not present

## 2014-09-20 DIAGNOSIS — I509 Heart failure, unspecified: Secondary | ICD-10-CM | POA: Diagnosis not present

## 2014-09-20 DIAGNOSIS — I10 Essential (primary) hypertension: Secondary | ICD-10-CM | POA: Diagnosis not present

## 2014-09-20 DIAGNOSIS — Z794 Long term (current) use of insulin: Secondary | ICD-10-CM | POA: Diagnosis not present

## 2014-09-21 ENCOUNTER — Ambulatory Visit (INDEPENDENT_AMBULATORY_CARE_PROVIDER_SITE_OTHER): Payer: Medicare Other | Admitting: Adult Health

## 2014-09-21 ENCOUNTER — Encounter: Payer: Self-pay | Admitting: Adult Health

## 2014-09-21 ENCOUNTER — Ambulatory Visit (INDEPENDENT_AMBULATORY_CARE_PROVIDER_SITE_OTHER)
Admission: RE | Admit: 2014-09-21 | Discharge: 2014-09-21 | Disposition: A | Discharge status: Home / Self Care | Payer: Medicare Other | Source: Ambulatory Visit | Attending: Adult Health | Admitting: Adult Health

## 2014-09-21 ENCOUNTER — Other Ambulatory Visit (INDEPENDENT_AMBULATORY_CARE_PROVIDER_SITE_OTHER): Payer: Medicare Other

## 2014-09-21 ENCOUNTER — Other Ambulatory Visit: Payer: Self-pay | Admitting: Adult Health

## 2014-09-21 VITALS — BP 92/62 | HR 79 | Temp 98.1°F | Ht 68.0 in | Wt 293.0 lb

## 2014-09-21 DIAGNOSIS — J810 Acute pulmonary edema: Secondary | ICD-10-CM

## 2014-09-21 DIAGNOSIS — J9611 Chronic respiratory failure with hypoxia: Secondary | ICD-10-CM

## 2014-09-21 DIAGNOSIS — G4733 Obstructive sleep apnea (adult) (pediatric): Secondary | ICD-10-CM

## 2014-09-21 DIAGNOSIS — R0602 Shortness of breath: Secondary | ICD-10-CM | POA: Diagnosis not present

## 2014-09-21 DIAGNOSIS — I5032 Chronic diastolic (congestive) heart failure: Secondary | ICD-10-CM

## 2014-09-21 DIAGNOSIS — I509 Heart failure, unspecified: Secondary | ICD-10-CM | POA: Diagnosis not present

## 2014-09-21 DIAGNOSIS — J9811 Atelectasis: Secondary | ICD-10-CM | POA: Diagnosis not present

## 2014-09-21 LAB — BASIC METABOLIC PANEL
BUN: 36 mg/dL — ABNORMAL HIGH (ref 6–23)
CHLORIDE: 101 meq/L (ref 96–112)
CO2: 34 mEq/L — ABNORMAL HIGH (ref 19–32)
Calcium: 9.4 mg/dL (ref 8.4–10.5)
Creatinine, Ser: 2.1 mg/dL — ABNORMAL HIGH (ref 0.40–1.20)
GFR: 24.39 mL/min — AB (ref >60.00)
Glucose, Bld: 113 mg/dL — ABNORMAL HIGH (ref 70–99)
POTASSIUM: 4.4 meq/L (ref 3.5–5.1)
Sodium: 139 mEq/L (ref 135–145)

## 2014-09-21 LAB — BRAIN NATRIURETIC PEPTIDE: PRO B NATRI PEPTIDE: 233 pg/mL — AB (ref 0.0–100.0)

## 2014-09-21 NOTE — Assessment & Plan Note (Signed)
Cont on O2 .  

## 2014-09-21 NOTE — Assessment & Plan Note (Signed)
OSA /OHS   Plan  Wear BIPAP with naps and At bedtime  .  Follow up Dr. Joya Gaskins  In 4-6 weeks and As needed   Please contact office for sooner follow up if symptoms do not improve or worsen or seek emergency care

## 2014-09-21 NOTE — Patient Instructions (Addendum)
Refer to you cardiology this week for CHF .  Low salt diet  Keep legs elevated.  Cut back on fluid intake.  Hold fish oil.  Wear BIPAP with naps and At bedtime  .  Labs and chest xray today .  Follow up Dr. Joya Gaskins  In 4-6 weeks and As needed   Please contact office for sooner follow up if symptoms do not improve or worsen or seek emergency care

## 2014-09-21 NOTE — Progress Notes (Signed)
   Subjective:    Patient ID: Meagan Campbell, female    DOB: 1941/03/31, 73 y.o.   MRN: [id number]  HPI 74 yo morbidly obese female with OHS w/  chronic hypercarbic respiratory failure   Has d chronic diastolic heart failure   8/34/1962 [facility] follow up  Patient presents for a post hospital follow-up Patient was transferred to [facility] March 11 for hospital admission. Patient was transferred from [facility] on March 11 after admission for acute respiratory failure requiring intubation. Likely secondary to CHF decompensation. Patient was treated with aggressive diuresis.  She was extubated to oxygen and nocturnal BiPAP. Patient was discharged to inpatient rehabilitation. Since discharge home 1 month ago   Patient is feeling.some better. Has HHN and PT at home.  She denies any chest pain, orthopnea, PND .  Feels breathing is better . But  Legs still very swollen. Wt trend back up.  On lasix 160mg  daily . Denies excess salt in take  Does drink 3-4 cups of water and pepsi each day.  Followed by Cardiology in [place], has not seen for >1 year.  Seen by PCP on Mon, rx for abx for finger infection.  Wears bipap at night. Does nap during daytime, not wearing BIPAP with naps.        Review of Systems Constitutional:   No  weight loss, night sweats,  Fevers, chills, + fatigue, or  lassitude.  HEENT:   No headaches,  Difficulty swallowing,  Tooth/dental problems, or  Sore throat,                No sneezing, itching, ear ache, nasal congestion, post nasal drip,   CV:  No chest pain,  Orthopnea, PND,  , anasarca, dizziness, palpitations, syncope.   GI  No heartburn, indigestion, abdominal pain, nausea, vomiting, diarrhea, change in bowel habits, loss of appetite, bloody stools.   Resp:  No excess mucus, no productive cough,  No non-productive cough,  No coughing up of blood.  No change in color of mucus.  No wheezing.  No chest wall deformity  Skin: no rash or  lesions.  GU: no dysuria, change in color of urine, no urgency or frequency.  No flank pain, no hematuria   MS:  No joint pain or swelling.  No decreased range of motion.  No back pain.  Psych:  No change in mood or affect. No depression or anxiety.  No memory loss.         Objective:   Physical Exam GEN: A/Ox3; pleasant , NAD, morbidly obese in wc   HEENT:  [place]/AT,  EACs-clear, TMs-wnl, NOSE-clear, THROAT-clear, no lesions, no postnasal drip or exudate noted.   NECK:  Supple w/ fair ROM; no JVD; normal carotid impulses w/o bruits; no thyromegaly or nodules palpated; no lymphadenopathy.  RESP  Decreased BS in bases , no accessory muscle use, no dullness to percussion  CARD:  RRR, no m/r/g  , 2+  peripheral edema, pulses intact, no cyanosis or clubbing.  GI:   Soft & nt; nml bowel sounds; no organomegaly or masses detected.  Musco: Warm bil, no deformities or joint swelling noted.   Neuro: alert, no focal deficits noted.    Skin: Warm, no lesions or rashes         Assessment & Plan:

## 2014-09-21 NOTE — Assessment & Plan Note (Signed)
Appears to have some fluid overload with increasing wt /leg swelling  On max lasix at 160mg  daily with renal insufficiency  Will check labs and bnp today  Need follow up with cards -she is established with [facility]  Has seen renal in past  Advised on salt and fluid intake  Needs to wear BIPAP with naps as well.

## 2014-09-22 DIAGNOSIS — J441 Chronic obstructive pulmonary disease with (acute) exacerbation: Secondary | ICD-10-CM | POA: Diagnosis not present

## 2014-09-22 DIAGNOSIS — I10 Essential (primary) hypertension: Secondary | ICD-10-CM | POA: Diagnosis not present

## 2014-09-22 DIAGNOSIS — I509 Heart failure, unspecified: Secondary | ICD-10-CM | POA: Diagnosis not present

## 2014-09-22 DIAGNOSIS — Z794 Long term (current) use of insulin: Secondary | ICD-10-CM | POA: Diagnosis not present

## 2014-09-22 DIAGNOSIS — E119 Type 2 diabetes mellitus without complications: Secondary | ICD-10-CM | POA: Diagnosis not present

## 2014-09-23 NOTE — Progress Notes (Signed)
Quick Note:  Called spoke with patient, advised of lab results / recs as stated by TP. Pt verbalized her understanding and denied any questions. Stressed to pt the importance of wearing her BiPAP daily with naps and at bedtime - pt voiced her understanding. Pt stated she has appt with her cardiologist Dr. Jimmie Molly on May 3 (verified in chart). Records sent to Dr. Andrey Campanile office. ______

## 2014-09-25 DIAGNOSIS — J441 Chronic obstructive pulmonary disease with (acute) exacerbation: Secondary | ICD-10-CM | POA: Diagnosis not present

## 2014-09-25 DIAGNOSIS — Z794 Long term (current) use of insulin: Secondary | ICD-10-CM | POA: Diagnosis not present

## 2014-09-25 DIAGNOSIS — I509 Heart failure, unspecified: Secondary | ICD-10-CM | POA: Diagnosis not present

## 2014-09-25 DIAGNOSIS — I10 Essential (primary) hypertension: Secondary | ICD-10-CM | POA: Diagnosis not present

## 2014-09-25 DIAGNOSIS — E119 Type 2 diabetes mellitus without complications: Secondary | ICD-10-CM | POA: Diagnosis not present

## 2014-09-26 DIAGNOSIS — I10 Essential (primary) hypertension: Secondary | ICD-10-CM | POA: Diagnosis not present

## 2014-09-26 DIAGNOSIS — E119 Type 2 diabetes mellitus without complications: Secondary | ICD-10-CM | POA: Diagnosis not present

## 2014-09-26 DIAGNOSIS — I509 Heart failure, unspecified: Secondary | ICD-10-CM | POA: Diagnosis not present

## 2014-09-26 DIAGNOSIS — Z794 Long term (current) use of insulin: Secondary | ICD-10-CM | POA: Diagnosis not present

## 2014-09-26 DIAGNOSIS — J441 Chronic obstructive pulmonary disease with (acute) exacerbation: Secondary | ICD-10-CM | POA: Diagnosis not present

## 2014-09-27 DIAGNOSIS — R609 Edema, unspecified: Secondary | ICD-10-CM | POA: Diagnosis not present

## 2014-09-27 DIAGNOSIS — I5030 Unspecified diastolic (congestive) heart failure: Secondary | ICD-10-CM | POA: Diagnosis not present

## 2014-09-27 DIAGNOSIS — I4891 Unspecified atrial fibrillation: Secondary | ICD-10-CM | POA: Diagnosis not present

## 2014-09-29 DIAGNOSIS — J441 Chronic obstructive pulmonary disease with (acute) exacerbation: Secondary | ICD-10-CM | POA: Diagnosis not present

## 2014-09-29 DIAGNOSIS — I10 Essential (primary) hypertension: Secondary | ICD-10-CM | POA: Diagnosis not present

## 2014-09-29 DIAGNOSIS — Z794 Long term (current) use of insulin: Secondary | ICD-10-CM | POA: Diagnosis not present

## 2014-09-29 DIAGNOSIS — E119 Type 2 diabetes mellitus without complications: Secondary | ICD-10-CM | POA: Diagnosis not present

## 2014-09-29 DIAGNOSIS — I509 Heart failure, unspecified: Secondary | ICD-10-CM | POA: Diagnosis not present

## 2014-09-30 DIAGNOSIS — N189 Chronic kidney disease, unspecified: Secondary | ICD-10-CM | POA: Diagnosis not present

## 2014-09-30 DIAGNOSIS — E039 Hypothyroidism, unspecified: Secondary | ICD-10-CM | POA: Diagnosis not present

## 2014-09-30 DIAGNOSIS — E1129 Type 2 diabetes mellitus with other diabetic kidney complication: Secondary | ICD-10-CM | POA: Diagnosis not present

## 2014-09-30 DIAGNOSIS — E785 Hyperlipidemia, unspecified: Secondary | ICD-10-CM | POA: Diagnosis not present

## 2014-09-30 DIAGNOSIS — Z23 Encounter for immunization: Secondary | ICD-10-CM | POA: Diagnosis not present

## 2014-09-30 DIAGNOSIS — Z1389 Encounter for screening for other disorder: Secondary | ICD-10-CM | POA: Diagnosis not present

## 2014-09-30 DIAGNOSIS — N184 Chronic kidney disease, stage 4 (severe): Secondary | ICD-10-CM | POA: Diagnosis not present

## 2014-10-01 DIAGNOSIS — J449 Chronic obstructive pulmonary disease, unspecified: Secondary | ICD-10-CM | POA: Diagnosis not present

## 2014-10-01 DIAGNOSIS — J9600 Acute respiratory failure, unspecified whether with hypoxia or hypercapnia: Secondary | ICD-10-CM | POA: Diagnosis not present

## 2014-10-03 DIAGNOSIS — Z794 Long term (current) use of insulin: Secondary | ICD-10-CM | POA: Diagnosis not present

## 2014-10-03 DIAGNOSIS — L03019 Cellulitis of unspecified finger: Secondary | ICD-10-CM | POA: Diagnosis not present

## 2014-10-03 DIAGNOSIS — I10 Essential (primary) hypertension: Secondary | ICD-10-CM | POA: Diagnosis not present

## 2014-10-03 DIAGNOSIS — S6991XA Unspecified injury of right wrist, hand and finger(s), initial encounter: Secondary | ICD-10-CM | POA: Diagnosis not present

## 2014-10-03 DIAGNOSIS — E119 Type 2 diabetes mellitus without complications: Secondary | ICD-10-CM | POA: Diagnosis not present

## 2014-10-03 DIAGNOSIS — M7989 Other specified soft tissue disorders: Secondary | ICD-10-CM | POA: Diagnosis not present

## 2014-10-03 DIAGNOSIS — J441 Chronic obstructive pulmonary disease with (acute) exacerbation: Secondary | ICD-10-CM | POA: Diagnosis not present

## 2014-10-03 DIAGNOSIS — M79641 Pain in right hand: Secondary | ICD-10-CM | POA: Diagnosis not present

## 2014-10-03 DIAGNOSIS — M79646 Pain in unspecified finger(s): Secondary | ICD-10-CM | POA: Diagnosis not present

## 2014-10-03 DIAGNOSIS — I509 Heart failure, unspecified: Secondary | ICD-10-CM | POA: Diagnosis not present

## 2014-10-06 DIAGNOSIS — I10 Essential (primary) hypertension: Secondary | ICD-10-CM | POA: Diagnosis not present

## 2014-10-06 DIAGNOSIS — M79644 Pain in right finger(s): Secondary | ICD-10-CM | POA: Diagnosis not present

## 2014-10-06 DIAGNOSIS — L03011 Cellulitis of right finger: Secondary | ICD-10-CM | POA: Diagnosis not present

## 2014-10-06 DIAGNOSIS — J449 Chronic obstructive pulmonary disease, unspecified: Secondary | ICD-10-CM | POA: Diagnosis not present

## 2014-10-07 DIAGNOSIS — I10 Essential (primary) hypertension: Secondary | ICD-10-CM | POA: Diagnosis not present

## 2014-10-07 DIAGNOSIS — Z794 Long term (current) use of insulin: Secondary | ICD-10-CM | POA: Diagnosis not present

## 2014-10-07 DIAGNOSIS — I509 Heart failure, unspecified: Secondary | ICD-10-CM | POA: Diagnosis not present

## 2014-10-07 DIAGNOSIS — J441 Chronic obstructive pulmonary disease with (acute) exacerbation: Secondary | ICD-10-CM | POA: Diagnosis not present

## 2014-10-07 DIAGNOSIS — E119 Type 2 diabetes mellitus without complications: Secondary | ICD-10-CM | POA: Diagnosis not present

## 2014-10-10 DIAGNOSIS — L089 Local infection of the skin and subcutaneous tissue, unspecified: Secondary | ICD-10-CM | POA: Diagnosis not present

## 2014-10-10 DIAGNOSIS — M869 Osteomyelitis, unspecified: Secondary | ICD-10-CM | POA: Diagnosis not present

## 2014-10-12 DIAGNOSIS — M79644 Pain in right finger(s): Secondary | ICD-10-CM | POA: Diagnosis not present

## 2014-10-12 DIAGNOSIS — I509 Heart failure, unspecified: Secondary | ICD-10-CM | POA: Diagnosis not present

## 2014-10-12 DIAGNOSIS — L989 Disorder of the skin and subcutaneous tissue, unspecified: Secondary | ICD-10-CM | POA: Diagnosis not present

## 2014-10-12 DIAGNOSIS — J441 Chronic obstructive pulmonary disease with (acute) exacerbation: Secondary | ICD-10-CM | POA: Diagnosis not present

## 2014-10-12 DIAGNOSIS — E119 Type 2 diabetes mellitus without complications: Secondary | ICD-10-CM | POA: Diagnosis not present

## 2014-10-12 DIAGNOSIS — Z794 Long term (current) use of insulin: Secondary | ICD-10-CM | POA: Diagnosis not present

## 2014-10-12 DIAGNOSIS — M1990 Unspecified osteoarthritis, unspecified site: Secondary | ICD-10-CM | POA: Diagnosis not present

## 2014-10-12 DIAGNOSIS — I10 Essential (primary) hypertension: Secondary | ICD-10-CM | POA: Diagnosis not present

## 2014-10-26 ENCOUNTER — Ambulatory Visit: Payer: Medicare Other | Admitting: Critical Care Medicine

## 2014-10-26 DIAGNOSIS — N184 Chronic kidney disease, stage 4 (severe): Secondary | ICD-10-CM | POA: Diagnosis not present

## 2014-10-26 DIAGNOSIS — M5137 Other intervertebral disc degeneration, lumbosacral region: Secondary | ICD-10-CM | POA: Diagnosis not present

## 2014-10-26 DIAGNOSIS — I5022 Chronic systolic (congestive) heart failure: Secondary | ICD-10-CM | POA: Diagnosis not present

## 2014-10-26 DIAGNOSIS — E662 Morbid (severe) obesity with alveolar hypoventilation: Secondary | ICD-10-CM | POA: Diagnosis not present

## 2014-10-27 ENCOUNTER — Ambulatory Visit: Payer: Medicare Other | Admitting: Critical Care Medicine

## 2014-10-31 ENCOUNTER — Encounter (HOSPITAL_COMMUNITY): Payer: Self-pay

## 2014-10-31 ENCOUNTER — Emergency Department (HOSPITAL_COMMUNITY): Payer: Medicare Other

## 2014-10-31 ENCOUNTER — Inpatient Hospital Stay (HOSPITAL_COMMUNITY)
Admission: EM | Admit: 2014-10-31 | Discharge: 2014-11-25 | DRG: 291 | Disposition: E | Payer: Medicare Other | Attending: Pulmonary Disease | Admitting: Pulmonary Disease

## 2014-10-31 DIAGNOSIS — Z885 Allergy status to narcotic agent status: Secondary | ICD-10-CM

## 2014-10-31 DIAGNOSIS — M1990 Unspecified osteoarthritis, unspecified site: Secondary | ICD-10-CM | POA: Diagnosis present

## 2014-10-31 DIAGNOSIS — D509 Iron deficiency anemia, unspecified: Secondary | ICD-10-CM | POA: Diagnosis present

## 2014-10-31 DIAGNOSIS — N179 Acute kidney failure, unspecified: Secondary | ICD-10-CM | POA: Diagnosis not present

## 2014-10-31 DIAGNOSIS — IMO0002 Reserved for concepts with insufficient information to code with codable children: Secondary | ICD-10-CM | POA: Diagnosis present

## 2014-10-31 DIAGNOSIS — Z7401 Bed confinement status: Secondary | ICD-10-CM | POA: Diagnosis not present

## 2014-10-31 DIAGNOSIS — J9610 Chronic respiratory failure, unspecified whether with hypoxia or hypercapnia: Secondary | ICD-10-CM | POA: Diagnosis not present

## 2014-10-31 DIAGNOSIS — G931 Anoxic brain damage, not elsewhere classified: Secondary | ICD-10-CM | POA: Insufficient documentation

## 2014-10-31 DIAGNOSIS — I6789 Other cerebrovascular disease: Secondary | ICD-10-CM | POA: Diagnosis not present

## 2014-10-31 DIAGNOSIS — Z515 Encounter for palliative care: Secondary | ICD-10-CM | POA: Diagnosis not present

## 2014-10-31 DIAGNOSIS — N390 Urinary tract infection, site not specified: Secondary | ICD-10-CM | POA: Diagnosis present

## 2014-10-31 DIAGNOSIS — J449 Chronic obstructive pulmonary disease, unspecified: Secondary | ICD-10-CM | POA: Diagnosis not present

## 2014-10-31 DIAGNOSIS — Z6841 Body Mass Index (BMI) 40.0 and over, adult: Secondary | ICD-10-CM

## 2014-10-31 DIAGNOSIS — L03115 Cellulitis of right lower limb: Secondary | ICD-10-CM | POA: Diagnosis not present

## 2014-10-31 DIAGNOSIS — G4733 Obstructive sleep apnea (adult) (pediatric): Secondary | ICD-10-CM | POA: Diagnosis not present

## 2014-10-31 DIAGNOSIS — G253 Myoclonus: Secondary | ICD-10-CM | POA: Diagnosis not present

## 2014-10-31 DIAGNOSIS — I5031 Acute diastolic (congestive) heart failure: Secondary | ICD-10-CM | POA: Diagnosis present

## 2014-10-31 DIAGNOSIS — Z7982 Long term (current) use of aspirin: Secondary | ICD-10-CM

## 2014-10-31 DIAGNOSIS — R0602 Shortness of breath: Secondary | ICD-10-CM | POA: Diagnosis not present

## 2014-10-31 DIAGNOSIS — Z452 Encounter for adjustment and management of vascular access device: Secondary | ICD-10-CM

## 2014-10-31 DIAGNOSIS — Z4682 Encounter for fitting and adjustment of non-vascular catheter: Secondary | ICD-10-CM | POA: Diagnosis not present

## 2014-10-31 DIAGNOSIS — I4891 Unspecified atrial fibrillation: Secondary | ICD-10-CM | POA: Diagnosis not present

## 2014-10-31 DIAGNOSIS — D638 Anemia in other chronic diseases classified elsewhere: Secondary | ICD-10-CM | POA: Diagnosis present

## 2014-10-31 DIAGNOSIS — E1121 Type 2 diabetes mellitus with diabetic nephropathy: Secondary | ICD-10-CM | POA: Diagnosis not present

## 2014-10-31 DIAGNOSIS — I878 Other specified disorders of veins: Secondary | ICD-10-CM | POA: Diagnosis not present

## 2014-10-31 DIAGNOSIS — L89312 Pressure ulcer of right buttock, stage 2: Secondary | ICD-10-CM | POA: Diagnosis present

## 2014-10-31 DIAGNOSIS — I468 Cardiac arrest due to other underlying condition: Secondary | ICD-10-CM | POA: Diagnosis not present

## 2014-10-31 DIAGNOSIS — R200 Anesthesia of skin: Secondary | ICD-10-CM | POA: Diagnosis not present

## 2014-10-31 DIAGNOSIS — G2581 Restless legs syndrome: Secondary | ICD-10-CM | POA: Diagnosis present

## 2014-10-31 DIAGNOSIS — L03116 Cellulitis of left lower limb: Secondary | ICD-10-CM | POA: Diagnosis not present

## 2014-10-31 DIAGNOSIS — J9601 Acute respiratory failure with hypoxia: Secondary | ICD-10-CM | POA: Diagnosis not present

## 2014-10-31 DIAGNOSIS — I959 Hypotension, unspecified: Secondary | ICD-10-CM | POA: Diagnosis not present

## 2014-10-31 DIAGNOSIS — E1129 Type 2 diabetes mellitus with other diabetic kidney complication: Secondary | ICD-10-CM | POA: Diagnosis present

## 2014-10-31 DIAGNOSIS — R600 Localized edema: Secondary | ICD-10-CM | POA: Insufficient documentation

## 2014-10-31 DIAGNOSIS — E1122 Type 2 diabetes mellitus with diabetic chronic kidney disease: Secondary | ICD-10-CM | POA: Diagnosis not present

## 2014-10-31 DIAGNOSIS — B961 Klebsiella pneumoniae [K. pneumoniae] as the cause of diseases classified elsewhere: Secondary | ICD-10-CM | POA: Diagnosis present

## 2014-10-31 DIAGNOSIS — I5033 Acute on chronic diastolic (congestive) heart failure: Principal | ICD-10-CM | POA: Diagnosis present

## 2014-10-31 DIAGNOSIS — N184 Chronic kidney disease, stage 4 (severe): Secondary | ICD-10-CM | POA: Diagnosis present

## 2014-10-31 DIAGNOSIS — E1165 Type 2 diabetes mellitus with hyperglycemia: Secondary | ICD-10-CM | POA: Diagnosis present

## 2014-10-31 DIAGNOSIS — J80 Acute respiratory distress syndrome: Secondary | ICD-10-CM | POA: Diagnosis present

## 2014-10-31 DIAGNOSIS — G609 Hereditary and idiopathic neuropathy, unspecified: Secondary | ICD-10-CM | POA: Diagnosis present

## 2014-10-31 DIAGNOSIS — I10 Essential (primary) hypertension: Secondary | ICD-10-CM | POA: Diagnosis present

## 2014-10-31 DIAGNOSIS — J9620 Acute and chronic respiratory failure, unspecified whether with hypoxia or hypercapnia: Secondary | ICD-10-CM | POA: Diagnosis present

## 2014-10-31 DIAGNOSIS — I890 Lymphedema, not elsewhere classified: Secondary | ICD-10-CM | POA: Diagnosis present

## 2014-10-31 DIAGNOSIS — Z794 Long term (current) use of insulin: Secondary | ICD-10-CM | POA: Diagnosis not present

## 2014-10-31 DIAGNOSIS — J9600 Acute respiratory failure, unspecified whether with hypoxia or hypercapnia: Secondary | ICD-10-CM | POA: Diagnosis not present

## 2014-10-31 DIAGNOSIS — E785 Hyperlipidemia, unspecified: Secondary | ICD-10-CM | POA: Diagnosis present

## 2014-10-31 DIAGNOSIS — R930 Abnormal findings on diagnostic imaging of skull and head, not elsewhere classified: Secondary | ICD-10-CM | POA: Diagnosis not present

## 2014-10-31 DIAGNOSIS — R0600 Dyspnea, unspecified: Secondary | ICD-10-CM | POA: Diagnosis not present

## 2014-10-31 DIAGNOSIS — J984 Other disorders of lung: Secondary | ICD-10-CM | POA: Diagnosis not present

## 2014-10-31 DIAGNOSIS — I129 Hypertensive chronic kidney disease with stage 1 through stage 4 chronic kidney disease, or unspecified chronic kidney disease: Secondary | ICD-10-CM | POA: Diagnosis present

## 2014-10-31 DIAGNOSIS — Z4659 Encounter for fitting and adjustment of other gastrointestinal appliance and device: Secondary | ICD-10-CM

## 2014-10-31 DIAGNOSIS — I509 Heart failure, unspecified: Secondary | ICD-10-CM | POA: Diagnosis not present

## 2014-10-31 DIAGNOSIS — E039 Hypothyroidism, unspecified: Secondary | ICD-10-CM | POA: Diagnosis present

## 2014-10-31 DIAGNOSIS — I469 Cardiac arrest, cause unspecified: Secondary | ICD-10-CM

## 2014-10-31 DIAGNOSIS — Z9981 Dependence on supplemental oxygen: Secondary | ICD-10-CM

## 2014-10-31 DIAGNOSIS — R4020 Unspecified coma: Secondary | ICD-10-CM | POA: Diagnosis not present

## 2014-10-31 DIAGNOSIS — R570 Cardiogenic shock: Secondary | ICD-10-CM

## 2014-10-31 DIAGNOSIS — Z9289 Personal history of other medical treatment: Secondary | ICD-10-CM

## 2014-10-31 DIAGNOSIS — Z66 Do not resuscitate: Secondary | ICD-10-CM | POA: Diagnosis not present

## 2014-10-31 DIAGNOSIS — L8990 Pressure ulcer of unspecified site, unspecified stage: Secondary | ICD-10-CM | POA: Diagnosis present

## 2014-10-31 LAB — BASIC METABOLIC PANEL
Anion gap: 12 (ref 5–15)
BUN: 37 mg/dL — ABNORMAL HIGH (ref 6–20)
CHLORIDE: 101 mmol/L (ref 101–111)
CO2: 31 mmol/L (ref 22–32)
Calcium: 8.9 mg/dL (ref 8.9–10.3)
Creatinine, Ser: 1.91 mg/dL — ABNORMAL HIGH (ref 0.44–1.00)
GFR calc Af Amer: 28 mL/min — ABNORMAL LOW (ref >60)
GFR calc non Af Amer: 25 mL/min — ABNORMAL LOW (ref >60)
GLUCOSE: 94 mg/dL (ref 65–99)
POTASSIUM: 3.7 mmol/L (ref 3.5–5.1)
Sodium: 144 mmol/L (ref 135–145)

## 2014-10-31 LAB — URINALYSIS, ROUTINE W REFLEX MICROSCOPIC
Bilirubin Urine: NEGATIVE
Glucose, UA: NEGATIVE mg/dL
Hgb urine dipstick: NEGATIVE
Ketones, ur: NEGATIVE mg/dL
Nitrite: POSITIVE — AB
PH: 7 (ref 5.0–8.0)
PROTEIN: NEGATIVE mg/dL
SPECIFIC GRAVITY, URINE: 1.009 (ref 1.005–1.030)
UROBILINOGEN UA: 0.2 mg/dL (ref 0.0–1.0)

## 2014-10-31 LAB — CBC
HCT: 30.5 % — ABNORMAL LOW (ref 36.0–46.0)
Hemoglobin: 9 g/dL — ABNORMAL LOW (ref 12.0–15.0)
MCH: 26.5 pg (ref 26.0–34.0)
MCHC: 29.5 g/dL — AB (ref 30.0–36.0)
MCV: 89.7 fL (ref 78.0–100.0)
Platelets: 178 10*3/uL (ref 150–400)
RBC: 3.4 MIL/uL — AB (ref 3.87–5.11)
RDW: 18.5 % — AB (ref 11.5–15.5)
WBC: 10.7 10*3/uL — ABNORMAL HIGH (ref 4.0–10.5)

## 2014-10-31 LAB — URINE MICROSCOPIC-ADD ON

## 2014-10-31 LAB — TSH: TSH: 3.501 u[IU]/mL (ref 0.350–4.500)

## 2014-10-31 LAB — BRAIN NATRIURETIC PEPTIDE: B Natriuretic Peptide: 207.1 pg/mL — ABNORMAL HIGH (ref 0.0–100.0)

## 2014-10-31 LAB — I-STAT TROPONIN, ED: Troponin i, poc: 0 ng/mL (ref 0.00–0.08)

## 2014-10-31 LAB — MRSA PCR SCREENING: MRSA by PCR: NEGATIVE

## 2014-10-31 LAB — GLUCOSE, CAPILLARY: Glucose-Capillary: 197 mg/dL — ABNORMAL HIGH (ref 65–99)

## 2014-10-31 MED ORDER — VANCOMYCIN HCL 10 G IV SOLR
1250.0000 mg | Freq: Once | INTRAVENOUS | Status: AC
  Administered 2014-10-31: 1250 mg via INTRAVENOUS
  Filled 2014-10-31: qty 1250

## 2014-10-31 MED ORDER — FERROUS SULFATE 325 (65 FE) MG PO TABS
325.0000 mg | ORAL_TABLET | Freq: Every day | ORAL | Status: DC
  Administered 2014-11-01 – 2014-11-03 (×3): 325 mg via ORAL
  Filled 2014-10-31 (×4): qty 1

## 2014-10-31 MED ORDER — FUROSEMIDE 10 MG/ML IJ SOLN
80.0000 mg | Freq: Once | INTRAMUSCULAR | Status: AC
  Administered 2014-10-31: 80 mg via INTRAVENOUS
  Filled 2014-10-31: qty 8

## 2014-10-31 MED ORDER — MAGNESIUM OXIDE 400 (241.3 MG) MG PO TABS
200.0000 mg | ORAL_TABLET | Freq: Two times a day (BID) | ORAL | Status: DC
  Administered 2014-10-31 – 2014-11-08 (×16): 200 mg via ORAL
  Filled 2014-10-31 (×18): qty 0.5

## 2014-10-31 MED ORDER — SODIUM CHLORIDE 0.9 % IJ SOLN
3.0000 mL | Freq: Two times a day (BID) | INTRAMUSCULAR | Status: DC
  Administered 2014-11-01 – 2014-11-08 (×12): 3 mL via INTRAVENOUS

## 2014-10-31 MED ORDER — ALBUTEROL SULFATE (2.5 MG/3ML) 0.083% IN NEBU: 2.5000 mg | INHALATION_SOLUTION | RESPIRATORY_TRACT | Status: DC | PRN

## 2014-10-31 MED ORDER — IPRATROPIUM-ALBUTEROL 0.5-2.5 (3) MG/3ML IN SOLN
3.0000 mL | Freq: Four times a day (QID) | RESPIRATORY_TRACT | Status: DC
  Administered 2014-10-31: 3 mL via RESPIRATORY_TRACT
  Filled 2014-10-31: qty 3

## 2014-10-31 MED ORDER — AMIODARONE HCL 200 MG PO TABS
200.0000 mg | ORAL_TABLET | Freq: Every day | ORAL | Status: DC
  Administered 2014-11-01 – 2014-11-08 (×8): 200 mg via ORAL
  Filled 2014-10-31 (×9): qty 1

## 2014-10-31 MED ORDER — VANCOMYCIN HCL IN DEXTROSE 1-5 GM/200ML-% IV SOLN
1000.0000 mg | Freq: Once | INTRAVENOUS | Status: AC
  Administered 2014-10-31: 1000 mg via INTRAVENOUS
  Filled 2014-10-31: qty 200

## 2014-10-31 MED ORDER — SODIUM CHLORIDE 0.9 % IJ SOLN: 3.0000 mL | INTRAMUSCULAR | Status: DC | PRN

## 2014-10-31 MED ORDER — PIPERACILLIN-TAZOBACTAM 3.375 G IVPB 30 MIN
3.3750 g | Freq: Once | INTRAVENOUS | Status: AC
  Administered 2014-10-31: 3.375 g via INTRAVENOUS
  Filled 2014-10-31: qty 50

## 2014-10-31 MED ORDER — ENOXAPARIN SODIUM 40 MG/0.4ML ~~LOC~~ SOLN
40.0000 mg | SUBCUTANEOUS | Status: DC
  Administered 2014-10-31 – 2014-11-02 (×3): 40 mg via SUBCUTANEOUS
  Filled 2014-10-31 (×4): qty 0.4

## 2014-10-31 MED ORDER — ASPIRIN 325 MG PO TABS
325.0000 mg | ORAL_TABLET | Freq: Every day | ORAL | Status: DC
Start: 2014-11-01 — End: 2014-11-09
  Administered 2014-11-01 – 2014-11-08 (×8): 325 mg via ORAL
  Filled 2014-10-31 (×8): qty 1

## 2014-10-31 MED ORDER — CARVEDILOL 6.25 MG PO TABS
6.2500 mg | ORAL_TABLET | Freq: Two times a day (BID) | ORAL | Status: DC
  Administered 2014-10-31 – 2014-11-07 (×13): 6.25 mg via ORAL
  Filled 2014-10-31 (×16): qty 1

## 2014-10-31 MED ORDER — CEFTRIAXONE SODIUM IN DEXTROSE 20 MG/ML IV SOLN
1.0000 g | INTRAVENOUS | Status: DC
  Filled 2014-10-31: qty 50

## 2014-10-31 MED ORDER — SODIUM CHLORIDE 0.9 % IV SOLN: 250.0000 mL | INTRAVENOUS | Status: DC | PRN

## 2014-10-31 MED ORDER — ALPRAZOLAM 0.25 MG PO TABS
0.2500 mg | ORAL_TABLET | Freq: Three times a day (TID) | ORAL | Status: DC | PRN
Start: 2014-10-31 — End: 2014-11-07
  Administered 2014-11-05: 0.25 mg via ORAL
  Filled 2014-10-31: qty 1

## 2014-10-31 MED ORDER — ISOSORBIDE MONONITRATE ER 30 MG PO TB24
30.0000 mg | ORAL_TABLET | Freq: Every day | ORAL | Status: DC
  Administered 2014-11-01 – 2014-11-06 (×6): 30 mg via ORAL
  Filled 2014-10-31 (×7): qty 1

## 2014-10-31 MED ORDER — HYDROCODONE-ACETAMINOPHEN 5-325 MG PO TABS
1.0000 | ORAL_TABLET | ORAL | Status: DC | PRN
  Administered 2014-11-02: 1 via ORAL
  Filled 2014-10-31: qty 1

## 2014-10-31 MED ORDER — FUROSEMIDE 10 MG/ML IJ SOLN
80.0000 mg | Freq: Two times a day (BID) | INTRAMUSCULAR | Status: DC
  Administered 2014-10-31 – 2014-11-02 (×5): 80 mg via INTRAVENOUS
  Filled 2014-10-31 (×8): qty 8

## 2014-10-31 MED ORDER — LEVOTHYROXINE SODIUM 50 MCG PO TABS
50.0000 ug | ORAL_TABLET | Freq: Every day | ORAL | Status: DC
  Administered 2014-11-01 – 2014-11-08 (×8): 50 ug via ORAL
  Filled 2014-10-31 (×9): qty 1

## 2014-10-31 MED ORDER — INSULIN ASPART 100 UNIT/ML ~~LOC~~ SOLN
0.0000 [IU] | Freq: Three times a day (TID) | SUBCUTANEOUS | Status: DC
  Administered 2014-11-01: 2 [IU] via SUBCUTANEOUS
  Administered 2014-11-01: 1 [IU] via SUBCUTANEOUS
  Administered 2014-11-01: 2 [IU] via SUBCUTANEOUS
  Administered 2014-11-02: 1 [IU] via SUBCUTANEOUS
  Administered 2014-11-02 (×2): 2 [IU] via SUBCUTANEOUS
  Administered 2014-11-03: 5 [IU] via SUBCUTANEOUS
  Administered 2014-11-03: 1 [IU] via SUBCUTANEOUS
  Administered 2014-11-03 – 2014-11-04 (×4): 2 [IU] via SUBCUTANEOUS
  Administered 2014-11-05: 5 [IU] via SUBCUTANEOUS
  Administered 2014-11-05: 2 [IU] via SUBCUTANEOUS
  Administered 2014-11-05: 5 [IU] via SUBCUTANEOUS
  Administered 2014-11-06: 3 [IU] via SUBCUTANEOUS
  Administered 2014-11-06: 5 [IU] via SUBCUTANEOUS
  Administered 2014-11-06 – 2014-11-07 (×2): 3 [IU] via SUBCUTANEOUS

## 2014-10-31 MED ORDER — HYDRALAZINE HCL 50 MG PO TABS
50.0000 mg | ORAL_TABLET | Freq: Three times a day (TID) | ORAL | Status: DC
  Administered 2014-10-31 – 2014-11-07 (×20): 50 mg via ORAL
  Filled 2014-10-31 (×23): qty 1

## 2014-10-31 MED ORDER — VANCOMYCIN HCL 10 G IV SOLR
1250.0000 mg | INTRAVENOUS | Status: DC
  Administered 2014-11-01 – 2014-11-02 (×2): 1250 mg via INTRAVENOUS
  Filled 2014-10-31 (×3): qty 1250

## 2014-10-31 MED ORDER — PIPERACILLIN-TAZOBACTAM 3.375 G IVPB
3.3750 g | Freq: Three times a day (TID) | INTRAVENOUS | Status: DC
  Administered 2014-11-01 – 2014-11-03 (×8): 3.375 g via INTRAVENOUS
  Filled 2014-10-31 (×9): qty 50

## 2014-10-31 MED ORDER — SODIUM CHLORIDE 0.9 % IJ SOLN
3.0000 mL | Freq: Two times a day (BID) | INTRAMUSCULAR | Status: DC
  Administered 2014-11-01 – 2014-11-08 (×11): 3 mL via INTRAVENOUS

## 2014-10-31 MED ORDER — ROPINIROLE HCL 1 MG PO TABS
2.0000 mg | ORAL_TABLET | Freq: Every day | ORAL | Status: DC
  Administered 2014-10-31 – 2014-11-07 (×7): 2 mg via ORAL
  Filled 2014-10-31 (×8): qty 2

## 2014-10-31 MED ORDER — SIMVASTATIN 40 MG PO TABS
40.0000 mg | ORAL_TABLET | Freq: Every day | ORAL | Status: DC
  Administered 2014-10-31: 40 mg via ORAL
  Filled 2014-10-31: qty 1

## 2014-10-31 MED ORDER — ATORVASTATIN CALCIUM 20 MG PO TABS
20.0000 mg | ORAL_TABLET | Freq: Every day | ORAL | Status: DC
  Administered 2014-11-01 – 2014-11-07 (×7): 20 mg via ORAL
  Filled 2014-10-31 (×8): qty 1

## 2014-10-31 MED ORDER — MECLIZINE HCL 25 MG PO TABS
25.0000 mg | ORAL_TABLET | Freq: Three times a day (TID) | ORAL | Status: DC | PRN
Start: 2014-10-31 — End: 2014-11-07
  Filled 2014-10-31: qty 1

## 2014-10-31 MED ORDER — MAGNESIUM OXIDE -MG SUPPLEMENT 400 (240 MG) MG PO TABS
1.0000 | ORAL_TABLET | Freq: Two times a day (BID) | ORAL | Status: DC
Start: 2014-10-31 — End: 2014-10-31

## 2014-10-31 MED ORDER — CARBIDOPA-LEVODOPA 25-250 MG PO TABS
1.0000 | ORAL_TABLET | Freq: Three times a day (TID) | ORAL | Status: DC
  Administered 2014-10-31 – 2014-11-08 (×24): 1 via ORAL
  Filled 2014-10-31 (×26): qty 1

## 2014-10-31 MED ORDER — GUAIFENESIN ER 600 MG PO TB12
600.0000 mg | ORAL_TABLET | Freq: Two times a day (BID) | ORAL | Status: DC
  Administered 2014-10-31 – 2014-11-02 (×4): 600 mg via ORAL
  Filled 2014-10-31 (×5): qty 1

## 2014-10-31 MED ORDER — PANTOPRAZOLE SODIUM 40 MG PO TBEC
40.0000 mg | DELAYED_RELEASE_TABLET | Freq: Every day | ORAL | Status: DC
  Administered 2014-11-01 – 2014-11-06 (×6): 40 mg via ORAL
  Filled 2014-10-31 (×4): qty 1

## 2014-10-31 MED ORDER — IPRATROPIUM-ALBUTEROL 0.5-2.5 (3) MG/3ML IN SOLN: 3.0000 mL | Freq: Two times a day (BID) | RESPIRATORY_TRACT | Status: DC

## 2014-10-31 MED ORDER — GUAIFENESIN-DM 100-10 MG/5ML PO SYRP
5.0000 mL | ORAL_SOLUTION | ORAL | Status: DC | PRN
Start: 2014-10-31 — End: 2014-11-07

## 2014-10-31 MED ORDER — BUDESONIDE 0.25 MG/2ML IN SUSP
0.2500 mg | Freq: Four times a day (QID) | RESPIRATORY_TRACT | Status: DC
  Administered 2014-10-31: 0.25 mg via RESPIRATORY_TRACT
  Filled 2014-10-31 (×3): qty 2

## 2014-10-31 NOTE — Progress Notes (Signed)
Placed patient on CPAP. Auto Titrate with a min of 5cmH2O and max press of 20cmH2O with 3L O2 bled in. Patient is tolerating it well and RT will continue to monitor.

## 2014-10-31 NOTE — H&P (Signed)
[facility] History and Physical  Meagan Campbell JKD:[id number] DOB: Aug 30, 1940 DOA: 11/13/2014  Referring physician: Dr. Leo Grosser PCP: Nicoletta Dress, MD   Chief Complaint: LE pain and swelling  HPI:  Patient is 74 year old female with multiple and complex medical history including hypertension, hyperlipidemia, diabetes mellitus, COPD on 2 L oxygen at home, obstructive sleep apnea on CPAP at nighttime, atrial fibrillation on amiodarone but not on anticoagulation, chronic diastolic CHF, morbid obesity, presented to [facility] emergency department with main concern of several days duration of progressively worsening shortness of breath that was initially noted with exertion and has progressed to dyspnea at rest over the past 24 hours, associated with 3-4 pillow orthopnea, progressively worsening lower extremity swelling and tenderness to palpation, nonproductive cough which is worse when laying down flat. Please note that patient is somewhat poor historian and family at bedside able to provide more details. Daughter reports that patient has been unable to get out of the bed due to progressively worsening lower extremity swelling, patient has been bedbound for several days. In addition patient reported poor oral intake, diminished urinary output. Daughter explained patient has been taking Lasix as prescribed but was not sure if it was helping. Daughter reports no specific abdominal concerns, no fevers or chills, no reported chest pain. Patient explains that she has noted bilateral lower extremity erythema from ankles to mid calf area, warm to touch, tender to palpation. Patient explains the pain is constant and throbbing, 3 out of 10 in severity, non radiating, localized to bilateral calf area, worse with movement, no specific alleviating factors.  In emergency department, patient noted to be hemodynamically stable, vital signs stable, patient in mild respiratory distress with RR 26. Chest  x-ray with no signs of pulmonary edema however, patient noted to be volume overloaded on exam. Lasix 80 mg IV 1 dose provided and TRH asked to admit for further evaluation.  Assessment and Plan: Active Problems: Acute respiratory failure - Appears to be secondary to acute diastolic CHF - Agree with providing Lasix 80 mg IV twice a day - Weight on admission 124 kg - Monitor daily weights, strict I/O - monitor clinical response and consult cardiology if indicated Chronic respiratory failure secondary to COPD and obstructive sleep apnea - Provide broncho-dilators scheduled and as needed, provide CPAP at nighttime - Please note that patient is on oxygen at home and will continue so while inpatient - 2 L of oxygen seem to be keeping oxygen saturation at target range Acute bilateral lower extremity cellulitis - Secondary to edema imposed on bilateral chronic venous stasis changes - Placed on broad-spectrum anti-biotics vancomycin and Zosyn for now and readjust anti-biotic regimen as clinically indicated - Attempt to keep extremities elevated as possible Questionable UTI - Suggested based and urinalysis, no specific urinary symptoms reported per daughter except diminished urinary output - Current antibiotic should be adequate in coverage, follow-up and urine culture Anemia of chronic disease, IDA, CKD - Hemoglobin 9, no signs of active bleeding - Continue iron supplementation - Repeat CBC in the morning 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 DVT prophylaxis - Lovenox SQ  Radiological Exams on Admission: Dg Chest Port 1 View  11/02/2014   CLINICAL DATA:  Shortness of breath and lower extremity edema.  EXAM: PORTABLE CHEST - 1 VIEW  COMPARISON:  09/21/2014  FINDINGS: Chronic scarring and atelectasis present in the left lower lung. Lung volumes are low. There is no evidence of pulmonary edema, consolidation, pneumothorax, nodule or pleural fluid. The heart size is stable.  IMPRESSION: Chronic left lower lung atelectasis/ scarring.  No active disease.   Electronically Signed   By: Aletta Edouard M.D.   On: 11/09/2014 13:27    Code Status: Full Family Communication: Multiple family members at bedside Disposition Plan: Admit for further evaluation    [facility] [phone]   Review of Systems:  Constitutional:  Negative for diaphoresis.  HENT: Negative for hearing loss, ear pain, nosebleeds, congestion, sore throat, neck pain, tinnitus and ear discharge.   Eyes: Negative for blurred vision, double vision, photophobia, pain, discharge and redness.  Respiratory: Negative for hemoptysis, wheezing and stridor.   Cardiovascular: Per history of present illness Gastrointestinal:Negative for heartburn, constipation, blood in stool and melena.  Genitourinary: Negative for dysuria, urgency, frequency, hematuria and flank pain.  Musculoskeletal: Negative for myalgias, back pain, joint pain and falls.  Skin: Negative for itching and rash.  Neurological: Negative for dizziness and weakness.  Endo/Heme/Allergies: Negative for environmental allergies and polydipsia. Does not bruise/bleed easily.  Psychiatric/Behavioral: Negative for suicidal ideas. The patient is not nervous/anxious.      Past Medical History  Diagnosis Date  . Anemia   . Edema   . Intention tremor   . Lymphedema   . Atrial fibrillation   .  Chronic respiratory failure   . Respiratory failure   . Congestive heart failure   . Insomnia   . Adenomatous colon polyp   . MRSA (methicillin resistant staph aureus) culture positive   . Hypertension   . Proteinuria   . Hyperlipidemia   . Restless legs   . Idiopathic peripheral neuropathy   . Urinary tract infection   . COPD (chronic obstructive pulmonary disease)   . On home oxygen therapy     "2L at night" (08/10/2014)  . PNA (pneumonia) 05/2013; 05/2014  . OSA on CPAP   . Type II diabetes mellitus   . DDD (degenerative disc disease)   . Arthritis     "joints" (08/10/2014    Past Surgical History  Procedure Laterality Date  . Shoulder arthroscopy w/ rotator cuff repair Left   . Abdominal hysterectomy    . Laparoscopic cholecystectomy    . Knee arthroscopy Right   . Cataract extraction w/ intraocular lens  implant, bilateral      Social History:  reports that she has never smoked. She has never used smokeless tobacco. She reports that she does not drink alcohol or use illicit drugs.  Allergies  Allergen Reactions  . Codeine     nausea  . Darvon [Propoxyphene]     nausea    Family History  Problem Relation Age of Onset  . Diabetes Sister   . Hypertension Sister   . Cirrhosis Father   . Cancer - Colon Mother   . Cancer Brother     rectal cancer    Medication Sig  ALPRAZolam (XANAX) 0.25 MG tablet Take 1 tablet (0.25 mg total) by mouth 3 (three) times daily as needed for anxiety.  amiodarone (PACERONE) 200 MG tablet Take 1 tablet (200 mg total) by mouth daily.  aspirin 325 MG tablet Take 1 tablet (325 mg total) by mouth daily.  budesonide (PULMICORT) 0.25 MG/2ML nebulizer solution Take 2 mLs (0.25 mg total) by nebulization every 6 (six) hours.  carbidopa-levodopa (SINEMET IR) 25-250 MG per tablet Take 1 tablet by mouth 3 (three) times daily.  carvedilol (COREG) 6.25 MG tablet Take 6.25 mg by mouth 2 (two) times daily.  ferrous sulfate 325 (65 FE) MG tablet Take  1 tablet (325 mg total) by mouth daily with breakfast.  furosemide (LASIX) 80 MG tablet Take 1 tablet (80 mg total) by mouth 2 (two) times daily.  hydrALAZINE (APRESOLINE) 50 MG tablet Take 50 mg by mouth 3 (three) times daily.  insulin aspart (NOVOLOG) 100 UNIT/ML injection Inject 4 Units into the skin 3 (three) times daily with meals. Patient taking differently: Inject 60 Units into the skin 2 (two) times daily with a meal.   ipratropium-albuterol (DUONEB) 0.5-2.5 (3) MG/3ML SOLN Take 3 mLs by nebulization every 6 (six) hours.  isosorbide mononitrate (IMDUR) 30 MG 24 hr tablet Take 1 tablet (30 mg total) by mouth daily.  levothyroxine (SYNTHROID, LEVOTHROID) 50 MCG tablet Take 50 mcg by mouth daily.  meclizine (ANTIVERT) 25 MG tablet Take 25 mg by mouth 3 (three) times daily as needed for dizziness.  pantoprazole (PROTONIX) 40 MG tablet Take 1 tablet (40 mg total) by mouth daily.  rOPINIRole (REQUIP) 4 MG tablet 2 tabs at bed time  simvastatin (ZOCOR) 40 MG tablet Take 1 tablet (40 mg total) by mouth daily.  ipratropium-albuterol (DUONEB) 0.5-2.5 (3) MG/3ML SOLN Take 3 mLs by nebulization 2 (two) times daily.     Physical Exam: Filed Vitals:   10/30/2014 1345 10/30/2014 1415 11/09/2014 1430 11/05/2014 1500  BP: 143/45 125/43 135/43 123/86  Pulse: 75 76 76 74  Temp:      TempSrc:      Resp: 15 19 26 24   SpO2: 96% 98% 97% 96%    Physical Exam  Constitutional: Appears stable, not in acute distress HENT: Normocephalic. External right and left ear normal. Oropharynx is clear and moist.  Eyes: Conjunctivae and EOM are normal. PERRLA, no scleral icterus.  Neck: Normal ROM. Neck supple. No JVD. No tracheal deviation. No thyromegaly.  CVS: RRR, S1/S2 +, no gallops, no carotid bruit.  Pulmonary: Effort and breath sounds normal, no stridor, bibasilar crackles with diminished breath sounds bilaterally Abdominal: Soft. BS +,  no distension, tenderness, rebound or guarding.  Musculoskeletal: Normal  range of motion. Bilateral lower extremity chronic venous stasis changes with +2 pitting edema and erythema extending from ankles to mid calf area, warm to touch and tender to palpation  Lymphadenopathy: No lymphadenopathy noted, cervical, inguinal. Neuro: Alert. Normal reflexes, muscle tone coordination. No cranial nerve deficit. Skin: Skin is warm and dry. No rash noted. Not diaphoretic. No erythema. No pallor.  Psychiatric: Normal mood and affect. Behavior, judgment, thought content normal.   Labs on Admission:  Basic Metabolic Panel:  Recent Labs Lab 11/07/2014 1302  NA 144  K 3.7  CL 101  CO2 31  GLUCOSE 94  BUN 37*  CREATININE 1.91*  CALCIUM 8.9   CBC:  Recent Labs Lab 10/28/2014 1302  WBC 10.7*  HGB 9.0*  HCT 30.5*  MCV 89.7  PLT 178    EKG: Pending   If 7PM-7AM, please contact night-coverage [email or web address] Password [email or web address] 11/06/2014, 3:31 PM

## 2014-10-31 NOTE — Progress Notes (Signed)
ANTIBIOTIC CONSULT NOTE - INITIAL  Pharmacy Consult for zosyn/vancomycin Indication: cellulitis  Allergies  Allergen Reactions  . Codeine     nausea  . Darvon [Propoxyphene]     nausea    Patient Measurements: Height: 5\' 8"  (172.7 cm) Weight: 274 lb 4 oz (124.4 kg) IBW/kg (Calculated) : 63.9  Vital Signs: Temp: 97.8 F (36.6 C) (06/06 1711) Temp Source: Oral (06/06 1711) BP: 130/41 mmHg (06/06 1711) Pulse Rate: 75 (06/06 1711) Intake/Output from previous day:   Intake/Output from this shift: Total I/O In: -  Out: 2500 [Urine:2500]  Labs:  Recent Labs  11/14/2014 1302  WBC 10.7*  HGB 9.0*  PLT 178  CREATININE 1.91*   Estimated Creatinine Clearance: 35.4 mL/min (by C-G formula based on Cr of 1.91). No results for input(s): VANCOTROUGH, VANCOPEAK, VANCORANDOM, GENTTROUGH, GENTPEAK, GENTRANDOM, TOBRATROUGH, TOBRAPEAK, TOBRARND, AMIKACINPEAK, AMIKACINTROU, AMIKACIN in the last 72 hours.   Microbiology: No results found for this or any previous visit (from the past 720 hour(s)).  Medical History: Past Medical History  Diagnosis Date  . Anemia   . Edema   . Intention tremor   . Lymphedema   . Atrial fibrillation   . Chronic respiratory failure   . Respiratory failure   . Congestive heart failure   . Insomnia   . Adenomatous colon polyp   . MRSA (methicillin resistant staph aureus) culture positive   . Hypertension   . Proteinuria   . Hyperlipidemia   . Restless legs   . Idiopathic peripheral neuropathy   . Urinary tract infection   . COPD (chronic obstructive pulmonary disease)   . On home oxygen therapy     "2L at night" (08/10/2014)  . PNA (pneumonia) 05/2013; 05/2014  . OSA on CPAP   . Type II diabetes mellitus   . DDD (degenerative disc disease)   . Arthritis     "joints" (08/10/2014    Medications:  Prescriptions prior to admission  Medication Sig Dispense Refill Last Dose  . ACCU-CHEK AVIVA PLUS test strip Use as directed   11/19/2014 at Unknown  time  . ALPRAZolam (XANAX) 0.25 MG tablet Take 1 tablet (0.25 mg total) by mouth 3 (three) times daily as needed for anxiety. 60 tablet 0 11/11/2014 at Unknown time  . amiodarone (PACERONE) 200 MG tablet Take 1 tablet (200 mg total) by mouth daily. 30 tablet 1 11/12/2014 at Unknown time  . ammonium lactate (LAC-HYDRIN) 5 % LOTN lotion Apply 1 application topically 2 (two) times daily. 1 Bottle 0 11/19/2014 at Unknown time  . aspirin 325 MG tablet Take 1 tablet (325 mg total) by mouth daily.   11/13/2014 at Unknown time  . Blood Glucose Calibration (ACCU-CHEK AVIVA) SOLN Use as directed   11/06/2014 at Unknown time  . Blood Glucose Monitoring Suppl (ACCU-CHEK AVIVA PLUS) W/DEVICE KIT Use as directed   11/12/2014 at Unknown time  . budesonide (PULMICORT) 0.25 MG/2ML nebulizer solution Take 2 mLs (0.25 mg total) by nebulization every 6 (six) hours. 60 mL 12 11/12/2014 at Unknown time  . carbidopa-levodopa (SINEMET IR) 25-250 MG per tablet Take 1 tablet by mouth 3 (three) times daily. 90 tablet 1 10/28/2014 at Unknown time  . carvedilol (COREG) 6.25 MG tablet Take 6.25 mg by mouth 2 (two) times daily.  5 11/03/2014 at 0530  . ferrous sulfate 325 (65 FE) MG tablet Take 1 tablet (325 mg total) by mouth daily with breakfast. 30 tablet 3 11/03/2014 at Unknown time  . furosemide (LASIX) 80 MG tablet  Take 1 tablet (80 mg total) by mouth 2 (two) times daily. 60 tablet 1 11/23/2014 at Unknown time  . hydrALAZINE (APRESOLINE) 50 MG tablet Take 50 mg by mouth 3 (three) times daily.   10/30/2014 at Unknown time  . insulin aspart (NOVOLOG) 100 UNIT/ML injection Inject 4 Units into the skin 3 (three) times daily with meals. (Patient taking differently: Inject 60 Units into the skin 2 (two) times daily with a meal. ) 10 mL 11 10/30/2014 at Unknown time  . ipratropium-albuterol (DUONEB) 0.5-2.5 (3) MG/3ML SOLN Take 3 mLs by nebulization every 6 (six) hours. 360 mL 0 11/11/2014 at Unknown time  . isosorbide mononitrate (IMDUR) 30 MG 24 hr tablet  Take 1 tablet (30 mg total) by mouth daily. 30 tablet 1 10/28/2014 at Unknown time  . levothyroxine (SYNTHROID, LEVOTHROID) 50 MCG tablet Take 50 mcg by mouth daily.  3 11/01/2014 at Unknown time  . meclizine (ANTIVERT) 25 MG tablet Take 25 mg by mouth 3 (three) times daily as needed for dizziness.   10/30/2014 at Unknown time  . Melatonin 5 MG TABS Take 1 tablet by mouth at bedtime.    10/30/2014 at Unknown time  . nystatin (MYCOSTATIN/NYSTOP) 100000 UNIT/GM POWD Apply topically as needed.   2 10/30/2014 at Unknown time  . pantoprazole (PROTONIX) 40 MG tablet Take 1 tablet (40 mg total) by mouth daily. 30 tablet 1 11/01/2014 at Unknown time  . rOPINIRole (REQUIP) 4 MG tablet 2 tabs at bed time   10/30/2014 at Unknown time  . simvastatin (ZOCOR) 40 MG tablet Take 1 tablet (40 mg total) by mouth daily. 30 tablet 1 10/30/2014 at Unknown time  . ULTILET CLASSIC LANCETS MISC by Does not apply route.   11/09/2014 at Unknown time  . ipratropium-albuterol (DUONEB) 0.5-2.5 (3) MG/3ML SOLN Take 3 mLs by nebulization 2 (two) times daily.    Taking  . lidocaine (LIDODERM) 5 % Place 1 patch onto the skin daily. Remove & Discard patch within 12 hours or as directed by MD (Patient not taking: Reported on 11/07/2014) 30 patch 0 Not Taking at Unknown time  . Magnesium Oxide 400 (240 MG) MG TABS Take 1 tablet by mouth 2 (two) times daily.  3 Taking  . Omega-3 Fatty Acids (FISH OIL CONCENTRATE PO) Take 2 capsules by mouth 2 (two) times daily.    Taking   Scheduled:  . [START ON 11/01/2014] amiodarone  200 mg Oral Daily  . [START ON 11/01/2014] aspirin  325 mg Oral Daily  . budesonide  0.25 mg Nebulization 4 times per day  . carbidopa-levodopa  1 tablet Oral TID  . carvedilol  6.25 mg Oral BID  . enoxaparin (LOVENOX) injection  40 mg Subcutaneous Q24H  . [START ON 11/01/2014] ferrous sulfate  325 mg Oral Q breakfast  . furosemide  80 mg Intravenous BID  . guaiFENesin  600 mg Oral BID  . hydrALAZINE  50 mg Oral TID  .  ipratropium-albuterol  3 mL Nebulization Q6H  . [START ON 11/01/2014] isosorbide mononitrate  30 mg Oral Daily  . [START ON 11/01/2014] levothyroxine  50 mcg Oral QAC breakfast  . magnesium oxide  200 mg Oral BID  . [START ON 11/01/2014] pantoprazole  40 mg Oral Daily  . piperacillin-tazobactam  3.375 g Intravenous Once  . rOPINIRole  2 mg Oral QHS  . simvastatin  40 mg Oral Daily  . sodium chloride  3 mL Intravenous Q12H  . sodium chloride  3 mL Intravenous Q12H  .  vancomycin  1,000 mg Intravenous Once    Assessment: 74 yo female here with leg pain and to begin vancomycin and zosyn for cellulitis per pharmacy. WBC= 10.7, afebrile, SCr= 1.91 and CrCl ~ 35. A dose of vancomycin $RemoveBefor'1000mg'[id number]$  IV and zosyn 3.375gm IV have been ordered.  6/6 zosyn>> 6/6 vanco>  Goal of Therapy:  Vancomycin trough level 10-15 mcg/ml  Plan:  -Continue zosyn 3.375gm IV q8h -Vancomycin $RemoveBeforeD'1250mg'[id number]$  IV x1 (to complete a load of $Remov'2250mg'ynUdAV$  IV) followed by $RemoveBef'1250mg'BvpbeinlIq$  IV q24hr -Will follow renal function, cultures and clinical progress  Hildred Laser, Pharm D 11/19/2014 6:44 PM

## 2014-10-31 NOTE — ED Notes (Signed)
Pt. Unable to ambulate or bear weight for bedside commode. Pt. Unable to tolerate bedpan at this time due to immobility and SOB. Order for foley catheter placed by Dr. Laneta Simmers for accurate assessment of I&O. Pt. Educated and updated.

## 2014-10-31 NOTE — ED Provider Notes (Signed)
CSN: [id number]     Arrival date & time 10/28/2014  1249 History   First MD Initiated Contact with Patient 11/22/2014 1307     Chief Complaint  Patient presents with  . Leg Swelling     (Consider location/radiation/quality/duration/timing/severity/associated sxs/prior Treatment) Patient is a 74 y.o. female presenting with leg pain. The history is provided by the patient.  Leg Pain Location:  Leg Time since incident:  1 day Injury: no   Leg location:  R lower leg and L lower leg Pain details:    Quality:  Aching   Radiates to:  Does not radiate   Severity:  Moderate   Onset quality:  Gradual   Duration:  1 day   Timing:  Constant   Progression:  Unchanged Chronicity:  New Prior injury to area:  No Relieved by:  Nothing Worsened by:  Nothing tried Ineffective treatments:  None tried Associated symptoms: numbness ( over feet), stiffness and swelling (severe)   Associated symptoms: no fever   Risk factors: obesity   Risk factors comment:  Chf   Past Medical History  Diagnosis Date  . Anemia   . Edema   . Intention tremor   . Lymphedema   . Atrial fibrillation   . Chronic respiratory failure   . Respiratory failure   . Congestive heart failure   . Insomnia   . Adenomatous colon polyp   . MRSA (methicillin resistant staph aureus) culture positive   . Hypertension   . Proteinuria   . Hyperlipidemia   . Restless legs   . Idiopathic peripheral neuropathy   . Urinary tract infection   . COPD (chronic obstructive pulmonary disease)   . On home oxygen therapy     "2L at night" (08/10/2014)  . PNA (pneumonia) 05/2013; 05/2014  . OSA on CPAP   . Type II diabetes mellitus   . DDD (degenerative disc disease)   . Arthritis     "joints" (08/10/2014   Past Surgical History  Procedure Laterality Date  . Shoulder arthroscopy w/ rotator cuff repair Left   . Abdominal hysterectomy    . Laparoscopic cholecystectomy    . Knee arthroscopy Right   . Cataract extraction w/  intraocular lens  implant, bilateral     Family History  Problem Relation Age of Onset  . Diabetes Sister   . Hypertension Sister   . Cirrhosis Father   . Cancer - Colon Mother   . Cancer Brother     rectal cancer   History  Substance Use Topics  . Smoking status: Never Smoker   . Smokeless tobacco: Never Used  . Alcohol Use: No   OB History    No data available     Review of Systems  Constitutional: Negative for fever.  Musculoskeletal: Positive for stiffness.  All other systems reviewed and are negative.     Allergies  Codeine and Darvon  Home Medications   Prior to Admission medications   Medication Sig Start Date End Date Taking? Authorizing Provider  ACCU-CHEK AVIVA PLUS test strip Use as directed   Yes Historical Provider, MD  ALPRAZolam (XANAX) 0.25 MG tablet Take 1 tablet (0.25 mg total) by mouth 3 (three) times daily as needed for anxiety. 08/19/14  Yes Cruzito Standre J Angiulli, PA-C  amiodarone (PACERONE) 200 MG tablet Take 1 tablet (200 mg total) by mouth daily. 08/19/14  Yes Shanik Brookshire J Angiulli, PA-C  ammonium lactate (LAC-HYDRIN) 5 % LOTN lotion Apply 1 application topically 2 (two) times daily. 08/19/14  Yes Lavon Paganini Angiulli, PA-C  aspirin 325 MG tablet Take 1 tablet (325 mg total) by mouth daily. 08/19/14  Yes Royanne Warshaw J Angiulli, PA-C  Blood Glucose Calibration (ACCU-CHEK AVIVA) SOLN Use as directed   Yes Historical Provider, MD  Blood Glucose Monitoring Suppl (ACCU-CHEK AVIVA PLUS) W/DEVICE KIT Use as directed   Yes Historical Provider, MD  budesonide (PULMICORT) 0.25 MG/2ML nebulizer solution Take 2 mLs (0.25 mg total) by nebulization every 6 (six) hours. 08/12/14  Yes Florencia Reasons, MD  carbidopa-levodopa (SINEMET IR) 25-250 MG per tablet Take 1 tablet by mouth 3 (three) times daily. 08/19/14  Yes Aniqa Hare J Angiulli, PA-C  carvedilol (COREG) 6.25 MG tablet Take 6.25 mg by mouth 2 (two) times daily. 09/12/14  Yes Historical Provider, MD  ferrous sulfate 325 (65 FE) MG tablet  Take 1 tablet (325 mg total) by mouth daily with breakfast. 08/19/14  Yes Keshia Weare J Angiulli, PA-C  furosemide (LASIX) 80 MG tablet Take 1 tablet (80 mg total) by mouth 2 (two) times daily. 08/19/14  Yes Takera Rayl J Angiulli, PA-C  hydrALAZINE (APRESOLINE) 50 MG tablet Take 50 mg by mouth 3 (three) times daily.   Yes Historical Provider, MD  insulin aspart (NOVOLOG) 100 UNIT/ML injection Inject 4 Units into the skin 3 (three) times daily with meals. Patient taking differently: Inject 60 Units into the skin 2 (two) times daily with a meal.  08/19/14  Yes Brandalynn Ofallon J Angiulli, PA-C  ipratropium-albuterol (DUONEB) 0.5-2.5 (3) MG/3ML SOLN Take 3 mLs by nebulization every 6 (six) hours. 08/12/14  Yes Florencia Reasons, MD  isosorbide mononitrate (IMDUR) 30 MG 24 hr tablet Take 1 tablet (30 mg total) by mouth daily. 08/19/14  Yes Tasharra Nodine J Angiulli, PA-C  levothyroxine (SYNTHROID, LEVOTHROID) 50 MCG tablet Take 50 mcg by mouth daily. 09/12/14  Yes Historical Provider, MD  meclizine (ANTIVERT) 25 MG tablet Take 25 mg by mouth 3 (three) times daily as needed for dizziness.   Yes Historical Provider, MD  Melatonin 5 MG TABS Take 1 tablet by mouth at bedtime.    Yes Historical Provider, MD  nystatin (MYCOSTATIN/NYSTOP) 100000 UNIT/GM POWD Apply topically as needed.  05/05/14  Yes Historical Provider, MD  pantoprazole (PROTONIX) 40 MG tablet Take 1 tablet (40 mg total) by mouth daily. 08/19/14  Yes Levy Cedano J Angiulli, PA-C  rOPINIRole (REQUIP) 4 MG tablet 2 tabs at bed time   Yes Historical Provider, MD  simvastatin (ZOCOR) 40 MG tablet Take 1 tablet (40 mg total) by mouth daily. 08/19/14  Yes Annalynn Centanni J Angiulli, PA-C  ULTILET CLASSIC LANCETS MISC by Does not apply route.   Yes Historical Provider, MD  ipratropium-albuterol (DUONEB) 0.5-2.5 (3) MG/3ML SOLN Take 3 mLs by nebulization 2 (two) times daily.     Historical Provider, MD  lidocaine (LIDODERM) 5 % Place 1 patch onto the skin daily. Remove & Discard patch within 12 hours or as  directed by MD Patient not taking: Reported on 11/04/2014 08/19/14   Lavon Paganini Angiulli, PA-C  Magnesium Oxide 400 (240 MG) MG TABS Take 1 tablet by mouth 2 (two) times daily. 06/21/14   Historical Provider, MD  Omega-3 Fatty Acids (FISH OIL CONCENTRATE PO) Take 2 capsules by mouth 2 (two) times daily.     Historical Provider, MD   BP 123/86 mmHg  Pulse 74  Temp(Src) 97.5 F (36.4 C) (Oral)  Resp 24  SpO2 96% Physical Exam  Constitutional: She is oriented to person, place, and time. She appears well-developed and well-nourished. No distress.  HENT:  Head: Normocephalic.  Eyes: Conjunctivae are normal.  Neck: Neck supple. No tracheal deviation present.  Cardiovascular: Normal rate and regular rhythm.   Pulmonary/Chest: Effort normal. No respiratory distress.  Abdominal: Soft. She exhibits no distension.  Musculoskeletal:       Right lower leg: She exhibits tenderness and edema.       Left lower leg: She exhibits tenderness and edema.  4+ b/l LE edema  Neurological: She is alert and oriented to person, place, and time.  Skin: Skin is warm and dry.  Psychiatric: She has a normal mood and affect.    ED Course  Procedures (including critical care time) Labs Review Labs Reviewed  CBC - Abnormal; Notable for the following:    WBC 10.7 (*)    RBC 3.40 (*)    Hemoglobin 9.0 (*)    HCT 30.5 (*)    MCHC 29.5 (*)    RDW 18.5 (*)    All other components within normal limits  BASIC METABOLIC PANEL - Abnormal; Notable for the following:    BUN 37 (*)    Creatinine, Ser 1.91 (*)    GFR calc non Af Amer 25 (*)    GFR calc Af Amer 28 (*)    All other components within normal limits  BRAIN NATRIURETIC PEPTIDE - Abnormal; Notable for the following:    B Natriuretic Peptide 207.1 (*)    All other components within normal limits  URINALYSIS, ROUTINE W REFLEX MICROSCOPIC (NOT AT [facility]) - Abnormal; Notable for the following:    APPearance HAZY (*)    Nitrite POSITIVE (*)    Leukocytes, UA  TRACE (*)    All other components within normal limits  URINE MICROSCOPIC-ADD ON - Abnormal; Notable for the following:    Bacteria, UA MANY (*)    All other components within normal limits  Randolm Idol, ED    Imaging Review Dg Chest Port 1 View  11/04/2014   CLINICAL DATA:  Shortness of breath and lower extremity edema.  EXAM: PORTABLE CHEST - 1 VIEW  COMPARISON:  09/21/2014  FINDINGS: Chronic scarring and atelectasis present in the left lower lung. Lung volumes are low. There is no evidence of pulmonary edema, consolidation, pneumothorax, nodule or pleural fluid. The heart size is stable.  IMPRESSION: Chronic left lower lung atelectasis/ scarring.  No active disease.   Electronically Signed   By: Aletta Edouard M.D.   On: 11/13/2014 13:27     EKG Interpretation   Date/Time:  Monday October 31 2014 13:07:16 EDT Ventricular Rate:  76 PR Interval:  215 QRS Duration: 167 QT Interval:  496 QTC Calculation: 558 R Axis:   87 Text Interpretation:  Sinus rhythm Ventricular premature complex  Borderline prolonged PR interval Right bundle branch block Otherwise no  significant change Confirmed by HARRISON  MD, FORREST ([id number]) on 11/11/2014  1:16:38 PM      MDM   Final diagnoses:  Bilateral lower extremity edema  Acute exacerbation of CHF (congestive heart failure)   74 year old female presents with worsening bilateral lower extremity edema, pain, and increasing numbness. She has become so swollen that she is unable to walk this morning and has limited range of motion of her ankles. She states that she has not had any weight gain but she is a difficult historian. She does have a history of CHF and takes 80 mg of Lasix daily at home. She was given her home dose of Lasix IV here to start the process of diuresis  and at this point in time I feel that her symptoms are too severe and her disability is too great to safely discharge home. We'll admit to hospitalist for ongoing diuresis and further  management of acute CHF exacerbation manifested with acute limb edema.   Leo Grosser, MD 11/23/2014 [place], MD 11/01/14 [phone]

## 2014-10-31 NOTE — ED Notes (Signed)
Pt. Is from home. Complaint of bilateral leg swelling/numbness/tingling. Pt. States she felt fine last PM when she went to bed. Legs are always swollen, hx of CHF, but swelling is worse today. Pt. Unable to ambulate/bear weight at home. Pt. Wears 2L O2 at home all the time, wears CPAP at night.

## 2014-11-01 ENCOUNTER — Ambulatory Visit: Payer: Medicare Other | Admitting: Critical Care Medicine

## 2014-11-01 ENCOUNTER — Inpatient Hospital Stay (HOSPITAL_COMMUNITY): Payer: Medicare Other

## 2014-11-01 DIAGNOSIS — L03115 Cellulitis of right lower limb: Secondary | ICD-10-CM

## 2014-11-01 DIAGNOSIS — E1129 Type 2 diabetes mellitus with other diabetic kidney complication: Secondary | ICD-10-CM

## 2014-11-01 DIAGNOSIS — I10 Essential (primary) hypertension: Secondary | ICD-10-CM

## 2014-11-01 DIAGNOSIS — R0600 Dyspnea, unspecified: Secondary | ICD-10-CM

## 2014-11-01 DIAGNOSIS — N184 Chronic kidney disease, stage 4 (severe): Secondary | ICD-10-CM

## 2014-11-01 DIAGNOSIS — E1165 Type 2 diabetes mellitus with hyperglycemia: Secondary | ICD-10-CM

## 2014-11-01 DIAGNOSIS — R600 Localized edema: Secondary | ICD-10-CM

## 2014-11-01 LAB — LIPID PANEL
CHOLESTEROL: 88 mg/dL (ref 0–200)
HDL: 33 mg/dL — ABNORMAL LOW (ref >40)
LDL Cholesterol: 30 mg/dL (ref 0–99)
Total CHOL/HDL Ratio: 2.7 RATIO
Triglycerides: 127 mg/dL (ref <150)
VLDL: 25 mg/dL (ref 0–40)

## 2014-11-01 LAB — BASIC METABOLIC PANEL
ANION GAP: 11 (ref 5–15)
BUN: 35 mg/dL — AB (ref 6–20)
CALCIUM: 8.4 mg/dL — AB (ref 8.9–10.3)
CO2: 35 mmol/L — ABNORMAL HIGH (ref 22–32)
Chloride: 99 mmol/L — ABNORMAL LOW (ref 101–111)
Creatinine, Ser: 2.02 mg/dL — ABNORMAL HIGH (ref 0.44–1.00)
GFR calc Af Amer: 27 mL/min — ABNORMAL LOW (ref >60)
GFR calc non Af Amer: 23 mL/min — ABNORMAL LOW (ref >60)
GLUCOSE: 134 mg/dL — AB (ref 65–99)
Potassium: 3.7 mmol/L (ref 3.5–5.1)
Sodium: 145 mmol/L (ref 135–145)

## 2014-11-01 LAB — CBC
HCT: 27.1 % — ABNORMAL LOW (ref 36.0–46.0)
Hemoglobin: 8.2 g/dL — ABNORMAL LOW (ref 12.0–15.0)
MCH: 27.4 pg (ref 26.0–34.0)
MCHC: 30.3 g/dL (ref 30.0–36.0)
MCV: 90.6 fL (ref 78.0–100.0)
PLATELETS: 167 10*3/uL (ref 150–400)
RBC: 2.99 MIL/uL — AB (ref 3.87–5.11)
RDW: 18.8 % — AB (ref 11.5–15.5)
WBC: 9 10*3/uL (ref 4.0–10.5)

## 2014-11-01 LAB — HEMOGLOBIN A1C
HEMOGLOBIN A1C: 6.3 % — AB (ref 4.8–5.6)
Mean Plasma Glucose: 134 mg/dL

## 2014-11-01 LAB — GLUCOSE, CAPILLARY
Glucose-Capillary: 136 mg/dL — ABNORMAL HIGH (ref 65–99)
Glucose-Capillary: 184 mg/dL — ABNORMAL HIGH (ref 65–99)
Glucose-Capillary: 167 mg/dL — ABNORMAL HIGH (ref 65–99)
Glucose-Capillary: 186 mg/dL — ABNORMAL HIGH (ref 65–99)

## 2014-11-01 MED ORDER — BUDESONIDE 0.25 MG/2ML IN SUSP
0.2500 mg | Freq: Four times a day (QID) | RESPIRATORY_TRACT | Status: DC
  Administered 2014-11-01: 0.25 mg via RESPIRATORY_TRACT
  Filled 2014-11-01 (×5): qty 2

## 2014-11-01 MED ORDER — BUDESONIDE 0.25 MG/2ML IN SUSP
0.2500 mg | Freq: Two times a day (BID) | RESPIRATORY_TRACT | Status: DC
  Administered 2014-11-01 – 2014-11-08 (×14): 0.25 mg via RESPIRATORY_TRACT
  Filled 2014-11-01 (×16): qty 2

## 2014-11-01 MED ORDER — ALBUTEROL SULFATE (2.5 MG/3ML) 0.083% IN NEBU
2.5000 mg | INHALATION_SOLUTION | RESPIRATORY_TRACT | Status: DC | PRN
  Administered 2014-11-08: 2.5 mg via RESPIRATORY_TRACT
  Filled 2014-11-01: qty 3

## 2014-11-01 MED ORDER — IPRATROPIUM-ALBUTEROL 0.5-2.5 (3) MG/3ML IN SOLN
3.0000 mL | Freq: Two times a day (BID) | RESPIRATORY_TRACT | Status: DC
  Administered 2014-11-01 – 2014-11-05 (×8): 3 mL via RESPIRATORY_TRACT
  Filled 2014-11-01 (×8): qty 3

## 2014-11-01 MED ORDER — IPRATROPIUM-ALBUTEROL 0.5-2.5 (3) MG/3ML IN SOLN
3.0000 mL | Freq: Four times a day (QID) | RESPIRATORY_TRACT | Status: DC
  Administered 2014-11-01: 3 mL via RESPIRATORY_TRACT
  Filled 2014-11-01: qty 3

## 2014-11-01 NOTE — Progress Notes (Signed)
[facility] PROGRESS NOTE  Meagan Campbell GUR:[id number] DOB: Oct 30, 1940 DOA: 11/06/2014 PCP: Nicoletta Dress, MD  Assessment/Plan: #1 acute respiratory distress secondary to acute diastolic CHF exacerbation Some clinical improvement. Patient admitted with a CHF exacerbation. Point-of-care cardiac markers were negative. 2-D echo has been done and is pending. I/O: -3.2 L. Patient's current weight is 116.5 kg from 124 kg on admission. Continue IV Lasix. Continue aspirin, Lipitor, Coreg.  #2 chronic respiratory failure secondary to COPD and OSA Stable. Continue bronchodilating is. Continue oxygen.CPAP bedtime.  #3 acute bilateral right greater than left lower extremity cellulitis Some clinical improvement. Patient on broad-spectrum antibiotics of IV vancomycin and Zosyn. Follow.  #4 questionable UTI Urine cultures pending. Continue empiric IV antibiotics.  #5 anemia of chronic disease Stable. Follow H&H.  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 Monitor with diuresis. Follow.  #7 type 2 diabetes with nephropathy Stable. Hemoglobin A1c = 6.3. Continue sliding scale insulin.  #8 atrial fibrillation Continue amiodarone and Coreg for rate control. Follow.  #9 hypertension Stable. Continue Coreg, hydralazine, Lasix, Inderal.  #10 hyperlipidemia Fasting lipid panel with LDL of 30. Continue statin.  #11 hypothyroidism TSH within normal limits. Continue Synthroid.  #12 morbid obesity  #13 prophylaxis Lovenox for DVT prophylaxis.    Code Status: Full Family Communication: Updated patient and husband at bedside. Disposition Plan: Home with shortness of breath has improved with adequate diuresis and cellulitis has improved.   Consultants:  None  Procedures:  2-D echo pending 11/01/2014  Chest x-ray 11/11/2014  Antibiotics:  None  HPI/Subjective: Patient states some improvement with his shortness of breath. Patient however  still short of breath. Patient denies any chest pain.  Objective: Filed Vitals:   11/01/14 0900  BP: 118/46  Pulse: 75  Temp:   Resp: 20    Intake/Output Summary (Last 24 hours) at 11/01/14 1415 Last data filed at 11/01/14 1300  Gross per 24 hour  Intake   1600 ml  Output   3900 ml  Net  -2300 ml   Filed Weights   11/23/2014 1635 11/03/2014 1711 11/01/14 0548  Weight: 122.471 kg (270 lb) 124.4 kg (274 lb 4 oz) 116.5 kg (256 lb 13.4 oz)    Exam:   General:  Laying in bed.  Cardiovascular: Distant heart sounds. Regular rate rhythm no murmurs rubs or gallops.  Respiratory: Bibasilar crackles.  Abdomen: Obese, soft, nontender, nondistended, positive bowel sounds.  Musculoskeletal: No clubbing or cyanosis. 2-3+ bilateral lower extremity edema. Bilateral R > L lower extremity with some erythema, warmth, tenderness to palpation.  Data Reviewed: Basic Metabolic Panel:  Recent Labs Lab 11/04/2014 1302 11/01/14 0340  NA 144 145  K 3.7 3.7  CL 101 99*  CO2 31 35*  GLUCOSE 94 134*  BUN 37* 35*  CREATININE 1.91* 2.02*  CALCIUM 8.9 8.4*   Liver Function Tests: No results for input(s): AST, ALT, ALKPHOS, BILITOT, PROT, ALBUMIN in the last 168 hours. No results for input(s): LIPASE, AMYLASE in the last 168 hours. No results for input(s): AMMONIA in the last 168 hours. CBC:  Recent Labs Lab 11/07/2014 1302 11/01/14 0340  WBC 10.7* 9.0  HGB 9.0* 8.2*  HCT 30.5* 27.1*  MCV 89.7 90.6  PLT 178 167   Cardiac Enzymes: No results for input(s): CKTOTAL, CKMB, CKMBINDEX, TROPONINI in the last 168 hours. BNP (last 3 results)  Recent Labs  11/02/2014 1302  BNP 207.1*    ProBNP (last 3 results)  Recent Labs  09/21/14  1236  PROBNP 233.0*    CBG:  Recent Labs Lab 11/07/2014 2124 11/01/14 0551 11/01/14 1142  GLUCAP 197* 136* 184*    Recent Results (from the past 240 hour(s))  MRSA PCR Screening     Status: None   Collection Time: 11/23/2014  5:17 PM  Result Value  Ref Range Status   MRSA by PCR NEGATIVE NEGATIVE Final    Comment:        The GeneXpert MRSA Assay (FDA approved for NASAL specimens only), is one component of a comprehensive MRSA colonization surveillance program. It is not intended to diagnose MRSA infection nor to guide or monitor treatment for MRSA infections.      Studies: Dg Chest Port 1 View  11/04/2014   CLINICAL DATA:  Shortness of breath and lower extremity edema.  EXAM: PORTABLE CHEST - 1 VIEW  COMPARISON:  09/21/2014  FINDINGS: Chronic scarring and atelectasis present in the left lower lung. Lung volumes are low. There is no evidence of pulmonary edema, consolidation, pneumothorax, nodule or pleural fluid. The heart size is stable.  IMPRESSION: Chronic left lower lung atelectasis/ scarring.  No active disease.   Electronically Signed   By: Aletta Edouard M.D.   On: 11/05/2014 13:27    Scheduled Meds: . amiodarone  200 mg Oral Daily  . aspirin  325 mg Oral Daily  . atorvastatin  20 mg Oral q1800  . budesonide  0.25 mg Nebulization BID  . carbidopa-levodopa  1 tablet Oral TID  . carvedilol  6.25 mg Oral BID  . enoxaparin (LOVENOX) injection  40 mg Subcutaneous Q24H  . ferrous sulfate  325 mg Oral Q breakfast  . furosemide  80 mg Intravenous BID  . guaiFENesin  600 mg Oral BID  . hydrALAZINE  50 mg Oral TID  . insulin aspart  0-9 Units Subcutaneous TID WC  . ipratropium-albuterol  3 mL Nebulization BID  . isosorbide mononitrate  30 mg Oral Daily  . levothyroxine  50 mcg Oral QAC breakfast  . magnesium oxide  200 mg Oral BID  . pantoprazole  40 mg Oral Daily  . piperacillin-tazobactam (ZOSYN)  IV  3.375 g Intravenous Q8H  . rOPINIRole  2 mg Oral QHS  . sodium chloride  3 mL Intravenous Q12H  . sodium chloride  3 mL Intravenous Q12H  . vancomycin  1,250 mg Intravenous Q24H   Continuous Infusions:   Principal Problem:   Acute diastolic CHF (congestive heart failure) Active Problems:   OSA (obstructive sleep  apnea)   DM type 2, uncontrolled, with renal complications   Hyperlipidemia   Hypertension   CKD (chronic kidney disease) stage 4, GFR 15-29 ml/min   Morbid obesity   Acute exacerbation of CHF (congestive heart failure)   Pressure ulcer   Cellulitis of leg, right    Time spent: 59 minutes    Aster Screws M.D. [facility] Pager [phone]. If 7PM-7AM, please contact night-coverage at [email or web address], password [facility] 11/01/2014, 2:15 PM  LOS: 1 day

## 2014-11-01 NOTE — Progress Notes (Signed)
  Echocardiogram 2D Echocardiogram has been performed.  Diamond Nickel 11/01/2014, 10:58 AM

## 2014-11-01 NOTE — Progress Notes (Signed)
Pt states she takes HHN BID at home. Will change to reflect home frequency per Protocol. Added Albuterol Q4 PRN for Wheezing/SOB. Pt presents with Fine Crackles Bilaterally. RT will continue to monitor.

## 2014-11-01 NOTE — Progress Notes (Signed)
UR COMPLETED  

## 2014-11-02 DIAGNOSIS — G4733 Obstructive sleep apnea (adult) (pediatric): Secondary | ICD-10-CM

## 2014-11-02 DIAGNOSIS — E785 Hyperlipidemia, unspecified: Secondary | ICD-10-CM

## 2014-11-02 DIAGNOSIS — L8990 Pressure ulcer of unspecified site, unspecified stage: Secondary | ICD-10-CM

## 2014-11-02 LAB — GLUCOSE, CAPILLARY
GLUCOSE-CAPILLARY: 148 mg/dL — AB (ref 65–99)
GLUCOSE-CAPILLARY: 195 mg/dL — AB (ref 65–99)
GLUCOSE-CAPILLARY: 170 mg/dL — AB (ref 65–99)
Glucose-Capillary: 229 mg/dL — ABNORMAL HIGH (ref 65–99)

## 2014-11-02 LAB — CBC
HCT: 27.8 % — ABNORMAL LOW (ref 36.0–46.0)
Hemoglobin: 8.2 g/dL — ABNORMAL LOW (ref 12.0–15.0)
MCH: 26.8 pg (ref 26.0–34.0)
MCHC: 29.5 g/dL — AB (ref 30.0–36.0)
MCV: 90.8 fL (ref 78.0–100.0)
PLATELETS: 171 10*3/uL (ref 150–400)
RBC: 3.06 MIL/uL — ABNORMAL LOW (ref 3.87–5.11)
RDW: 18.6 % — AB (ref 11.5–15.5)
WBC: 8 10*3/uL (ref 4.0–10.5)

## 2014-11-02 LAB — BASIC METABOLIC PANEL
Anion gap: 13 (ref 5–15)
BUN: 35 mg/dL — ABNORMAL HIGH (ref 6–20)
CO2: 31 mmol/L (ref 22–32)
CREATININE: 2.14 mg/dL — AB (ref 0.44–1.00)
Calcium: 8.4 mg/dL — ABNORMAL LOW (ref 8.9–10.3)
Chloride: 98 mmol/L — ABNORMAL LOW (ref 101–111)
GFR calc Af Amer: 25 mL/min — ABNORMAL LOW (ref >60)
GFR calc non Af Amer: 21 mL/min — ABNORMAL LOW (ref >60)
Glucose, Bld: 140 mg/dL — ABNORMAL HIGH (ref 65–99)
Potassium: 4.4 mmol/L (ref 3.5–5.1)
Sodium: 142 mmol/L (ref 135–145)

## 2014-11-02 LAB — BRAIN NATRIURETIC PEPTIDE: B Natriuretic Peptide: 298.9 pg/mL — ABNORMAL HIGH (ref 0.0–100.0)

## 2014-11-02 MED ORDER — GUAIFENESIN ER 600 MG PO TB12
1200.0000 mg | ORAL_TABLET | Freq: Two times a day (BID) | ORAL | Status: DC
  Administered 2014-11-02 – 2014-11-08 (×12): 1200 mg via ORAL
  Filled 2014-11-02 (×13): qty 2

## 2014-11-02 NOTE — Evaluation (Signed)
Occupational Therapy Evaluation Patient Details Name: Meagan Campbell MRN: [id number] DOB: 09-06-1940 Today's Date: 11/02/2014    History of Present Illness Pt admitted with CHF, B LE cellulits and acute respiratory failure due to COPD. PMH: chronic anemia, CKD, DM, afib, HTN, OSA, obesity.   Clinical Impression   Pt was assisted for bathing, dressing, toileting and all IADL prior to admission.  She used a RW with assistance.  Pt presents with lethargy, impaired cognition, decreased activity tolerance and poor balance interfering with ability to perform self care.  Recommend SNF for ST rehab.  Will defer further OT to SNF.    Follow Up Recommendations  SNF;Supervision/Assistance - 24 hour    Equipment Recommendations  None recommended by OT    Recommendations for Other Services       Precautions / Restrictions Precautions Precautions: Fall Precaution Comments: on chronic 02 Restrictions Weight Bearing Restrictions: No      Mobility Bed Mobility Overal bed mobility: Needs Assistance Bed Mobility: Supine to Sit, Sit to Supine     Supine to sit: Mod assist Sit to supine: Min assist   General bed mobility comments: reminders for sequence and to use RUE effectively, assist for LEs back into bed  Transfers Overall transfer level: Needs assistance Equipment used: Rolling walker (2 wheeled);1 person hand held assist Transfers: Sit to/from [facility] Sit to Stand: Mod assist Stand pivot transfers: Min assist;Mod assist          Balance Overall balance assessment: Needs assistance Sitting-balance support: Feet supported Sitting balance-Leahy Scale: Poor   Postural control: Posterior lean Standing balance support: Bilateral upper extremity supported Standing balance-Leahy Scale: Poor                              ADL Overall ADL's : Needs assistance/impaired Eating/Feeding: Set up;Sitting   Grooming: Wash/dry hands;Wash/dry face;Set  up;Sitting   Upper Body Bathing: Moderate assistance;Sitting   Lower Body Bathing: Total assistance;Sit to/from stand   Upper Body Dressing : Moderate assistance;Sitting   Lower Body Dressing: Total assistance;Sit to/from stand   Toilet Transfer: Minimal assistance;Stand-pivot;BSC   Toileting- Clothing Manipulation and Hygiene: Total assistance;Sit to/from stand               Vision     Perception     Praxis      Pertinent Vitals/Pain Pain Assessment: No/denies pain     Hand Dominance Left   Extremity/Trunk Assessment Upper Extremity Assessment Upper Extremity Assessment: Generalized weakness   Lower Extremity Assessment Lower Extremity Assessment: Generalized weakness   Cervical / Trunk Assessment Cervical / Trunk Assessment: Kyphotic   Communication Communication Communication: No difficulties   Cognition Arousal/Alertness: Lethargic Behavior During Therapy: WFL for tasks assessed/performed;Anxious Overall Cognitive Status: Impaired/Different from baseline Area of Impairment: Safety/judgement;Awareness;Problem solving;Memory     Memory: Decreased short-term memory   Safety/Judgement: Decreased awareness of safety;Decreased awareness of deficits Awareness: Intellectual Problem Solving: Slow processing;Requires verbal cues General Comments: Pt is doing well with cues for mobility but cannot plan her initiation of standing, better once movement is initiated   General Comments       Exercises       Shoulder Instructions      Home Living Family/patient expects to be discharged to:: Private residence Living Arrangements: Spouse/significant other Available Help at Discharge: Family;Available 24 hours/day Type of Home: House Home Access: Ramped entrance     Home Layout: One level     Bathroom Shower/Tub:  Walk-in shower;Door   [employer] Toilet: Standard     Home Equipment: Environmental consultant - 2 wheels;Walker - 4 wheels;Wheelchair - [employer];Shower seat          Prior Functioning/Environment Level of Independence: Needs assistance  Gait / Transfers Assistance Needed: Ambulates with RW in house, rollator outside. ADL's / Homemaking Assistance Needed: husband assisted pt with all self care and performed all IADL.        OT Diagnosis: Generalized weakness;Cognitive deficits   OT Problem List: Decreased strength;Decreased activity tolerance;Impaired balance (sitting and/or standing);Decreased cognition;Decreased safety awareness;Decreased knowledge of use of DME or AE;Obesity;Increased edema   OT Treatment/Interventions:      OT Goals(Current goals can be found in the care plan section) Acute Rehab OT Goals Patient Stated Goal: to get up and walk again  OT Frequency:     Barriers to D/C:            Co-evaluation              End of Session    Activity Tolerance: Patient limited by fatigue Patient left: in bed;with call bell/phone within reach;with bed alarm set   Time: 1219-7588 OT Time Calculation (min): 16 min Charges:  OT General Charges $OT Visit: 1 Procedure OT Evaluation $Initial OT Evaluation Tier I: 1 Procedure G-Codes:    Malka So 11/02/2014, 2:59 PM  [phone]

## 2014-11-02 NOTE — Evaluation (Signed)
Physical Therapy Evaluation Patient Details Name: Meagan Campbell MRN: [id number] DOB: Oct 25, 1940 Today's Date: 11/02/2014   History of Present Illness  Pt admitted with CHF, B LE cellulits and acute respiratory failure due to COPD. PMH: chronic anemia, CKD, DM, afib, HTN, OSA, obesity.  Clinical Impression  Pt was seen for assessment of her functional level and noted she was better able to move than anticipated.  Her plan is to go to SNF for rehab and then home with husband.  Her PLOF was independent on rollator at home and her husband did not have to assist her with O2, as she did not use there.  Plan is to focus on balance and gait here, increase time OOB and promote better ventilation in upright sitting position.    Follow Up Recommendations SNF    Equipment Recommendations  Rolling walker with 5" wheels    Recommendations for Other Services       Precautions / Restrictions Precautions Precautions: Fall Precaution Comments: on chronic 02      Mobility  Bed Mobility Overal bed mobility: Needs Assistance Bed Mobility: Supine to Sit     Supine to sit: Mod assist     General bed mobility comments: reminders for sequence and to use RUE effectively  Transfers Overall transfer level: Needs assistance Equipment used: Rolling walker (2 wheeled);1 person hand held assist;2 person hand held assist Transfers: Sit to/from [facility] Sit to Stand: Mod assist Stand pivot transfers: Min assist;Mod assist       General transfer comment: better independence once her first couple standing efforts were done  Ambulation/Gait Ambulation/Gait assistance: Min assist;Mod assist Ambulation Distance (Feet): 4 Feet Assistive device: Rolling walker (2 wheeled);1 person hand held assist Gait Pattern/deviations: Step-to pattern;Decreased stride length;Decreased dorsiflexion - right;Decreased dorsiflexion - left;Wide base of support;Trunk flexed Gait velocity: redduced Gait  velocity interpretation: Below normal speed for age/gender General Gait Details: sidestepped both directions with PT using RW to L and HHA to R  Stairs            Wheelchair Mobility    Modified Rankin (Stroke Patients Only)       Balance Overall balance assessment: Needs assistance Sitting-balance support: Feet supported Sitting balance-Leahy Scale: Poor   Postural control: Posterior lean Standing balance support: Bilateral upper extremity supported Standing balance-Leahy Scale: Poor                               Pertinent Vitals/Pain Pain Assessment: No/denies pain    Home Living Family/patient expects to be discharged to:: Private residence Living Arrangements: Spouse/significant other Available Help at Discharge: Family;Available 24 hours/day Type of Home: House Home Access: Ramped entrance     Home Layout: One level Home Equipment: Walker - 2 wheels;Walker - 4 wheels;Wheelchair - [employer];Shower seat      Prior Function                 Hand Dominance   Dominant Hand: Left    Extremity/Trunk Assessment   Upper Extremity Assessment: Generalized weakness           Lower Extremity Assessment: Generalized weakness      Cervical / Trunk Assessment: Kyphotic  Communication   Communication: No difficulties  Cognition Arousal/Alertness: Lethargic Behavior During Therapy: WFL for tasks assessed/performed;Anxious Overall Cognitive Status: Impaired/Different from baseline Area of Impairment: Safety/judgement;Awareness;Problem solving;Memory     Memory: Decreased short-term memory   Safety/Judgement: Decreased awareness of safety;Decreased awareness  of deficits Awareness: Intellectual Problem Solving: Slow processing;Requires verbal cues General Comments: Pt is doing well with cues for mobility but cannot plan her initiation of standing, better once movement is initiated    General Comments General comments (skin  integrity, edema, etc.): SOB with all exertion but can rest and stand again.      Exercises        Assessment/Plan    PT Assessment Patient needs continued PT services  PT Diagnosis Generalized weakness;Difficulty walking   PT Problem List Decreased strength;Decreased range of motion;Decreased activity tolerance;Decreased balance;Decreased mobility;Decreased coordination;Decreased knowledge of use of DME;Cardiopulmonary status limiting activity;Obesity;Impaired sensation (LE's numb per pt)  PT Treatment Interventions DME instruction;Gait training;Functional mobility training;Therapeutic activities;Therapeutic exercise;Balance training;Neuromuscular re-education;Cognitive remediation;Patient/family education   PT Goals (Current goals can be found in the Care Plan section) Acute Rehab PT Goals Patient Stated Goal: to get up and walk again PT Goal Formulation: With patient/family Time For Goal Achievement: 11/16/14 Potential to Achieve Goals: Good    Frequency Min 2X/week   Barriers to discharge Decreased caregiver support;Inaccessible home environment husband is not going to be able to stand her    Co-evaluation               End of Session Equipment Utilized During Treatment: Gait belt;Oxygen Activity Tolerance: Patient tolerated treatment well;Patient limited by fatigue Patient left: in chair;with call bell/phone within reach;with chair alarm set;with family/visitor present Nurse Communication: Mobility status         Time: 5537-4827 PT Time Calculation (min) (ACUTE ONLY): 26 min   Charges:   PT Evaluation $Initial PT Evaluation Tier I: 1 Procedure PT Treatments $Gait Training: 8-22 mins   PT G Codes:        Chad Cordial, PT MS Acute Rehab Dept. Number: [phone] and [facility] [phone] 11/02/2014, 2:44 PM

## 2014-11-02 NOTE — Progress Notes (Signed)
[facility] PROGRESS NOTE   Meagan Campbell FTD:[id number] DOB: 1940-10-25 DOA: 11/12/2014 PCP: Nicoletta Dress, MD  HPI/Subjective: Feels better, still has some shortness of breath, still significant swelling in the legs.   Assessment/Plan: Principal Problem:   Acute diastolic CHF (congestive heart failure) Active Problems:   OSA (obstructive sleep apnea)   DM type 2, uncontrolled, with renal complications   Hyperlipidemia   Hypertension   CKD (chronic kidney disease) stage 4, GFR 15-29 ml/min   Morbid obesity   Acute exacerbation of CHF (congestive heart failure)   Pressure ulcer   Cellulitis of leg, right   Bilateral lower extremity edema   Acute respiratory failure - Appears to be secondary to acute diastolic CHF - Agree with providing Lasix 80 mg IV twice a day - Weight on admission 124 kg,? Weight today is 117, total diuresis she diuresed 3.8 L net negative. - Monitor daily weights, strict I/O - Check BMP in a.m., continue current diuresis, and TED hose.  Chronic respiratory failure secondary to COPD and obstructive sleep apnea - Provide broncho-dilators scheduled and as needed, provide CPAP at nighttime - Please note that patient is on oxygen at home and will continue so while inpatient - 2 L of oxygen seem to be keeping oxygen saturation at target range  Acute bilateral lower extremity cellulitis - Secondary to edema imposed on bilateral chronic venous stasis changes - Redness improved already, continue current antibiotics. - Had TED hose.  Questionable UTI - Suggested based and urinalysis, urinalysis showing 60,000 CFUs. - On Zosyn, continue coverage.  Anemia of chronic disease, IDA, CKD - Hemoglobin 9, no signs of active bleeding - Continue iron supplementation - Repeat CBC in the morning  Acute on chronic kidney disease stage 3-4 - Baseline creatinine 1.8 - Continue diuresis with Lasix and monitor renal function closely  Diabetes mellitus type  2 with complications of chronic kidney disease - CBGs in 80s to 90s - Place on sliding scale insulin for now, check A1c  Atrial fibrillation, rate controlled - Continue amiodarone, Coreg, Imdur  Hypertension - Reasonably stable on admission - Continue Coreg, Imdur, hydralazine, Lasix  Hyperlipidemia - We'll check lipid panel, continue statin per home regimen  Hypothyroidism - Check TSH, continue Synthroid  Morbid obesity - Body mass index is 41.71 kg/(m^2).  Code Status: Full Code Family Communication: Plan discussed with the patient. Disposition Plan: Remains inpatient Diet: Diet Heart Room service appropriate?: Yes; Fluid consistency:: Thin  Consultants:  None  Procedures:  None  Antibiotics:  Zosyn vancomycin   Objective: Filed Vitals:   11/02/14 0959  BP: 153/56  Pulse: 75  Temp:   Resp:     Intake/Output Summary (Last 24 hours) at 11/02/14 1250 Last data filed at 11/02/14 1200  Gross per 24 hour  Intake   1000 ml  Output   1450 ml  Net   -450 ml   Filed Weights   11/07/2014 1711 11/01/14 0548 11/02/14 0423  Weight: 124.4 kg (274 lb 4 oz) 116.5 kg (256 lb 13.4 oz) 117 kg (257 lb 15 oz)    Exam: General: Alert and awake, oriented x3, not in any acute distress. HEENT: anicteric sclera, pupils reactive to light and accommodation, EOMI CVS: S1-S2 clear, no murmur rubs or gallops Chest: clear to auscultation bilaterally, no wheezing, rales or rhonchi Abdomen: soft nontender, nondistended, normal bowel sounds, no organomegaly Extremities: no cyanosis, +2 pedal edema bilaterally Neuro: Cranial nerves II-XII intact, no focal neurological deficits  Data Reviewed: Basic Metabolic Panel:  Recent Labs Lab 11/10/2014 1302 11/01/14 0340 11/02/14 0408  NA 144 145 142  K 3.7 3.7 4.4  CL 101 99* 98*  CO2 31 35* 31  GLUCOSE 94 134* 140*  BUN 37* 35* 35*  CREATININE 1.91* 2.02* 2.14*  CALCIUM 8.9 8.4* 8.4*   Liver Function Tests: No results for  input(s): AST, ALT, ALKPHOS, BILITOT, PROT, ALBUMIN in the last 168 hours. No results for input(s): LIPASE, AMYLASE in the last 168 hours. No results for input(s): AMMONIA in the last 168 hours. CBC:  Recent Labs Lab 10/30/2014 1302 11/01/14 0340 11/02/14 0408  WBC 10.7* 9.0 8.0  HGB 9.0* 8.2* 8.2*  HCT 30.5* 27.1* 27.8*  MCV 89.7 90.6 90.8  PLT 178 167 171   Cardiac Enzymes: No results for input(s): CKTOTAL, CKMB, CKMBINDEX, TROPONINI in the last 168 hours. BNP (last 3 results)  Recent Labs  10/27/2014 1302 11/02/14 0348  BNP 207.1* 298.9*    ProBNP (last 3 results)  Recent Labs  09/21/14 1236  PROBNP 233.0*    CBG:  Recent Labs Lab 11/01/14 1142 11/01/14 1606 11/01/14 2116 11/02/14 0556 11/02/14 1144  GLUCAP 184* 167* 186* 148* 195*    Micro Recent Results (from the past 240 hour(s))  MRSA PCR Screening     Status: None   Collection Time: 11/05/2014  5:17 PM  Result Value Ref Range Status   MRSA by PCR NEGATIVE NEGATIVE Final    Comment:        The GeneXpert MRSA Assay (FDA approved for NASAL specimens only), is one component of a comprehensive MRSA colonization surveillance program. It is not intended to diagnose MRSA infection nor to guide or monitor treatment for MRSA infections.   Urine culture     Status: None (Preliminary result)   Collection Time: 10/29/2014  7:02 PM  Result Value Ref Range Status   Specimen Description URINE, CATHETERIZED  Final   Special Requests NONE  Final   Colony Count   Final    60,000 COLONIES/ML Performed at [place]    Culture   Final    [place] Performed at [facility]    Report Status PENDING  Incomplete     Studies: Dg Chest Port 1 View  10/27/2014   CLINICAL DATA:  Shortness of breath and lower extremity edema.  EXAM: PORTABLE CHEST - 1 VIEW  COMPARISON:  09/21/2014  FINDINGS: Chronic scarring and atelectasis present in the left lower lung. Lung volumes are low. There is no  evidence of pulmonary edema, consolidation, pneumothorax, nodule or pleural fluid. The heart size is stable.  IMPRESSION: Chronic left lower lung atelectasis/ scarring.  No active disease.   Electronically Signed   By: Aletta Edouard M.D.   On: 10/30/2014 13:27    Scheduled Meds: . amiodarone  200 mg Oral Daily  . aspirin  325 mg Oral Daily  . atorvastatin  20 mg Oral q1800  . budesonide  0.25 mg Nebulization BID  . carbidopa-levodopa  1 tablet Oral TID  . carvedilol  6.25 mg Oral BID  . enoxaparin (LOVENOX) injection  40 mg Subcutaneous Q24H  . ferrous sulfate  325 mg Oral Q breakfast  . furosemide  80 mg Intravenous BID  . guaiFENesin  600 mg Oral BID  . hydrALAZINE  50 mg Oral TID  . insulin aspart  0-9 Units Subcutaneous TID WC  . ipratropium-albuterol  3 mL Nebulization BID  . isosorbide mononitrate  30 mg Oral Daily  . levothyroxine  50 mcg Oral QAC breakfast  . magnesium oxide  200 mg Oral BID  . pantoprazole  40 mg Oral Daily  . piperacillin-tazobactam (ZOSYN)  IV  3.375 g Intravenous Q8H  . rOPINIRole  2 mg Oral QHS  . sodium chloride  3 mL Intravenous Q12H  . sodium chloride  3 mL Intravenous Q12H  . vancomycin  1,250 mg Intravenous Q24H   Continuous Infusions:      Time spent: 35 minutes    [facility] A  [facility] Pager [phone] If 7PM-7AM, please contact night-coverage at [email or web address], password [email or web address] 11/02/2014, 12:50 PM  LOS: 2 days

## 2014-11-03 LAB — GLUCOSE, CAPILLARY
GLUCOSE-CAPILLARY: 211 mg/dL — AB (ref 65–99)
Glucose-Capillary: 141 mg/dL — ABNORMAL HIGH (ref 65–99)
Glucose-Capillary: 275 mg/dL — ABNORMAL HIGH (ref 65–99)
Glucose-Capillary: 176 mg/dL — ABNORMAL HIGH (ref 65–99)

## 2014-11-03 LAB — URINE CULTURE: Colony Count: 60000

## 2014-11-03 LAB — BASIC METABOLIC PANEL
Anion gap: 11 (ref 5–15)
BUN: 34 mg/dL — AB (ref 6–20)
CO2: 35 mmol/L — ABNORMAL HIGH (ref 22–32)
CREATININE: 2.23 mg/dL — AB (ref 0.44–1.00)
Calcium: 8.4 mg/dL — ABNORMAL LOW (ref 8.9–10.3)
Chloride: 99 mmol/L — ABNORMAL LOW (ref 101–111)
GFR calc Af Amer: 24 mL/min — ABNORMAL LOW (ref >60)
GFR, EST NON AFRICAN AMERICAN: 20 mL/min — AB (ref >60)
Glucose, Bld: 149 mg/dL — ABNORMAL HIGH (ref 65–99)
POTASSIUM: 3.4 mmol/L — AB (ref 3.5–5.1)
SODIUM: 145 mmol/L (ref 135–145)

## 2014-11-03 LAB — MAGNESIUM: Magnesium: 2 mg/dL (ref 1.7–2.4)

## 2014-11-03 MED ORDER — CEFTRIAXONE SODIUM IN DEXTROSE 20 MG/ML IV SOLN
1.0000 g | INTRAVENOUS | Status: DC
  Administered 2014-11-03 – 2014-11-04 (×2): 1 g via INTRAVENOUS
  Filled 2014-11-03 (×3): qty 50

## 2014-11-03 MED ORDER — FUROSEMIDE 10 MG/ML IJ SOLN
40.0000 mg | Freq: Two times a day (BID) | INTRAMUSCULAR | Status: DC
  Administered 2014-11-03 – 2014-11-04 (×3): 40 mg via INTRAVENOUS
  Filled 2014-11-03 (×2): qty 4

## 2014-11-03 NOTE — Progress Notes (Signed)
[facility] PROGRESS NOTE   Meagan Campbell ZOX:[id number] DOB: [birth year] DOA: 11/20/2014 PCP: Nicoletta Dress, MD  HPI/Subjective: Overall better, still have significant shortness of breath.  Assessment/Plan: Principal Problem:   Acute diastolic CHF (congestive heart failure) Active Problems:   OSA (obstructive sleep apnea)   DM type 2, uncontrolled, with renal complications   Hyperlipidemia   Hypertension   CKD (chronic kidney disease) stage 4, GFR 15-29 ml/min   Morbid obesity   Acute exacerbation of CHF (congestive heart failure)   Pressure ulcer   Cellulitis of leg, right   Bilateral lower extremity edema   Acute respiratory failure - Presented with shortness of breath, significant lower extremity edema and elevated BNP. - Appears to be secondary to acute on chronic diastolic CHF. - Weight on admission 124 kg,? Weight today is 117, total diuresis she diuresed 3.8 L net negative. - The weight is 263 pounds since yesterday even with the intake/output net -4.3 L - TED hose, diuresis decreased to 40 mg IV twice a day, add incentive spirometry.  Chronic respiratory failure secondary to COPD and obstructive sleep apnea - Provide broncho-dilators scheduled and as needed, provide CPAP at nighttime - Please note that patient is on oxygen at home and will continue so while inpatient - 2 L of oxygen seem to be keeping oxygen saturation at target range  Acute bilateral lower extremity cellulitis - Secondary to edema imposed on bilateral chronic venous stasis changes - Redness improved already, de-escalate antibiotics to Rocephin.  Questionable UTI - Suggested based and urinalysis, urinalysis showing 60,000 CFUs. - Antibiotics will be switched to Rocephin.  Anemia of chronic disease, IDA, CKD - Hemoglobin 9, no signs of active bleeding - Continue iron supplementation - Repeat CBC in the morning  Acute on chronic kidney disease stage 3-4 - Baseline creatinine 1.8 -  Continue diuresis with Lasix and monitor renal function closely  Diabetes mellitus type 2 with complications of chronic kidney disease - CBGs in 80s to 90s - Place on sliding scale insulin for now, check A1c  Atrial fibrillation, rate controlled - Continue amiodarone, Coreg, Imdur  Hypertension - Reasonably stable on admission - Continue Coreg, Imdur, hydralazine, Lasix  Hyperlipidemia - We'll check lipid panel, continue statin per home regimen  Hypothyroidism - Check TSH, continue Synthroid  Morbid obesity - Body mass index is 41.71 kg/(m^2).  Code Status: Full Code Family Communication: Plan discussed with the patient. Disposition Plan: Remains inpatient Diet: Diet Heart Room service appropriate?: Yes; Fluid consistency:: Thin  Consultants:  None  Procedures:  None  Antibiotics:  Zosyn vancomycin   Objective: Filed Vitals:   11/03/14 0928  BP: 125/43  Pulse: 71  Temp:   Resp:     Intake/Output Summary (Last 24 hours) at 11/03/14 1328 Last data filed at 11/03/14 0927  Gross per 24 hour  Intake   1475 ml  Output   1905 ml  Net   -430 ml   Filed Weights   11/01/14 0548 11/02/14 0423 11/03/14 0424  Weight: 116.5 kg (256 lb 13.4 oz) 117 kg (257 lb 15 oz) 119.3 kg (263 lb 0.1 oz)    Exam: General: Alert and awake, oriented x3, not in any acute distress. HEENT: anicteric sclera, pupils reactive to light and accommodation, EOMI CVS: S1-S2 clear, no murmur rubs or gallops Chest: clear to auscultation bilaterally, no wheezing, rales or rhonchi Abdomen: soft nontender, nondistended, normal bowel sounds, no organomegaly Extremities: no cyanosis, +2 pedal edema bilaterally Neuro: Cranial nerves II-XII intact, no focal  neurological deficits  Data Reviewed: Basic Metabolic Panel:  Recent Labs Lab 11/16/2014 1302 11/01/14 0340 11/02/14 0408 11/03/14 0419  NA 144 145 142 145  K 3.7 3.7 4.4 3.4*  CL 101 99* 98* 99*  CO2 31 35* 31 35*  GLUCOSE 94 134*  140* 149*  BUN 37* 35* 35* 34*  CREATININE 1.91* 2.02* 2.14* 2.23*  CALCIUM 8.9 8.4* 8.4* 8.4*  MG  --   --   --  2.0   Liver Function Tests: No results for input(s): AST, ALT, ALKPHOS, BILITOT, PROT, ALBUMIN in the last 168 hours. No results for input(s): LIPASE, AMYLASE in the last 168 hours. No results for input(s): AMMONIA in the last 168 hours. CBC:  Recent Labs Lab 11/04/2014 1302 11/01/14 0340 11/02/14 0408  WBC 10.7* 9.0 8.0  HGB 9.0* 8.2* 8.2*  HCT 30.5* 27.1* 27.8*  MCV 89.7 90.6 90.8  PLT 178 167 171   Cardiac Enzymes: No results for input(s): CKTOTAL, CKMB, CKMBINDEX, TROPONINI in the last 168 hours. BNP (last 3 results)  Recent Labs  11/07/2014 1302 11/02/14 0348  BNP 207.1* 298.9*    ProBNP (last 3 results)  Recent Labs  09/21/14 1236  PROBNP 233.0*    CBG:  Recent Labs Lab 11/02/14 1144 11/02/14 1628 11/02/14 2140 11/03/14 0603 11/03/14 1134  GLUCAP 195* 170* 229* 141* 275*    Micro Recent Results (from the past 240 hour(s))  MRSA PCR Screening     Status: None   Collection Time: 11/07/2014  5:17 PM  Result Value Ref Range Status   MRSA by PCR NEGATIVE NEGATIVE Final    Comment:        The GeneXpert MRSA Assay (FDA approved for NASAL specimens only), is one component of a comprehensive MRSA colonization surveillance program. It is not intended to diagnose MRSA infection nor to guide or monitor treatment for MRSA infections.   Urine culture     Status: None   Collection Time: 10/30/2014  7:02 PM  Result Value Ref Range Status   Specimen Description URINE, CATHETERIZED  Final   Special Requests NONE  Final   Colony Count   Final    60,000 COLONIES/ML Performed at [facility]    Culture   Final    KLEBSIELLA PNEUMONIAE Performed at [facility]    Report Status 11/03/2014 FINAL  Final   Organism ID, Bacteria KLEBSIELLA PNEUMONIAE  Final      Susceptibility   Klebsiella pneumoniae - MIC*    AMPICILLIN >=32  RESISTANT Resistant     CEFAZOLIN <=4 SENSITIVE Sensitive     CEFTRIAXONE <=1 SENSITIVE Sensitive     CIPROFLOXACIN <=0.25 SENSITIVE Sensitive     GENTAMICIN <=1 SENSITIVE Sensitive     LEVOFLOXACIN 1 SENSITIVE Sensitive     NITROFURANTOIN 64 INTERMEDIATE Intermediate     TOBRAMYCIN <=1 SENSITIVE Sensitive     TRIMETH/SULFA <=20 SENSITIVE Sensitive     PIP/TAZO 16 SENSITIVE Sensitive     * KLEBSIELLA PNEUMONIAE     Studies: No results found.  Scheduled Meds: . amiodarone  200 mg Oral Daily  . aspirin  325 mg Oral Daily  . atorvastatin  20 mg Oral q1800  . budesonide  0.25 mg Nebulization BID  . carbidopa-levodopa  1 tablet Oral TID  . carvedilol  6.25 mg Oral BID  . enoxaparin (LOVENOX) injection  40 mg Subcutaneous Q24H  . ferrous sulfate  325 mg Oral Q breakfast  . furosemide  40 mg Intravenous BID  .  guaiFENesin  1,200 mg Oral BID  . hydrALAZINE  50 mg Oral TID  . insulin aspart  0-9 Units Subcutaneous TID WC  . ipratropium-albuterol  3 mL Nebulization BID  . isosorbide mononitrate  30 mg Oral Daily  . levothyroxine  50 mcg Oral QAC breakfast  . magnesium oxide  200 mg Oral BID  . pantoprazole  40 mg Oral Daily  . piperacillin-tazobactam (ZOSYN)  IV  3.375 g Intravenous Q8H  . rOPINIRole  2 mg Oral QHS  . sodium chloride  3 mL Intravenous Q12H  . sodium chloride  3 mL Intravenous Q12H  . vancomycin  1,250 mg Intravenous Q24H   Continuous Infusions:      Time spent: 35 minutes    [facility] A  [facility] Pager [phone] If 7PM-7AM, please contact night-coverage at [email or web address], password [facility] 11/03/2014, 1:28 PM  LOS: 3 days

## 2014-11-03 NOTE — Clinical Social Work Placement (Signed)
   CLINICAL SOCIAL WORK PLACEMENT  NOTE  Date:  11/03/2014  Patient Details  Name: Meagan Campbell MRN: [id number] Date of Birth: 26-Dec-1940  Clinical Social Work is seeking post-discharge placement for this patient at the [place] level of care (*CSW will initial, date and re-position this form in  chart as items are completed):  Yes   Patient/family provided with [place] Work Department's list of facilities offering this level of care within the geographic area requested by the patient (or if unable, by the patient's family).  Yes   Patient/family informed of their freedom to choose among providers that offer the needed level of care, that participate in Medicare, Medicaid or managed care program needed by the patient, have an available bed and are willing to accept the patient.  Yes   Patient/family informed of [place]'s ownership interest in [facility] and [facility], as well as of the fact that they are under no obligation to receive care at these facilities.  PASRR submitted to EDS on       PASRR number received on       Existing PASRR number confirmed on 11/03/14     FL2 transmitted to all facilities in geographic area requested by pt/family on 11/03/14     FL2 transmitted to all facilities within larger geographic area on       Patient informed that his/her managed care company has contracts with or will negotiate with certain facilities, including the following:            Patient/family informed of bed offers received.  Patient chooses bed at       Physician recommends and patient chooses bed at      Patient to be transferred to   on  .  Patient to be transferred to facility by       Patient family notified on   of transfer.  Name of family member notified:        PHYSICIAN       Additional Comment:    _______________________________________________ Greta Doom, LCSW 11/03/2014, 3:08 PM

## 2014-11-03 NOTE — Clinical Social Work Note (Signed)
CSW Consult Acknowledged:   CSW received a consult for SNF placement. CSW met with the pt. Pt agreeable. Full assessment to follow.     [place], MSW, [place]

## 2014-11-03 NOTE — Progress Notes (Signed)
ANTIBIOTIC CONSULT NOTE - FOLLOW UP  Pharmacy Consult for Vanco/Zosyn Indication: Cellulitis and UTI  Allergies  Allergen Reactions  . Codeine     nausea  . Darvon [Propoxyphene]     nausea    Patient Measurements: Height: 5\' 8"  (172.7 cm) Weight: 263 lb 0.1 oz (119.3 kg) (bedscale) IBW/kg (Calculated) : 63.9 Adjusted Body Weight:    Vital Signs: Temp: 97.8 F (36.6 C) (06/09 0424) Temp Source: Axillary (06/09 0424) BP: 125/43 mmHg (06/09 0928) Pulse Rate: 71 (06/09 0928) Intake/Output from previous day: 06/08 0701 - 06/09 0700 In: 1440 [P.O.:930; I.V.:60; IV Piggyback:450] Out: 2005 [Urine:2005] Intake/Output from this shift: Total I/O In: 395 [P.O.:395] Out: 200 [Urine:200]  Labs:  Recent Labs  11/07/2014 1302 11/01/14 0340 11/02/14 0408 11/03/14 0419  WBC 10.7* 9.0 8.0  --   HGB 9.0* 8.2* 8.2*  --   PLT 178 167 171  --   CREATININE 1.91* 2.02* 2.14* 2.23*   Estimated Creatinine Clearance: 29.6 mL/min (by C-G formula based on Cr of 2.23). No results for input(s): VANCOTROUGH, VANCOPEAK, VANCORANDOM, GENTTROUGH, GENTPEAK, GENTRANDOM, TOBRATROUGH, TOBRAPEAK, TOBRARND, AMIKACINPEAK, AMIKACINTROU, AMIKACIN in the last 72 hours.   Microbiology: Recent Results (from the past 720 hour(s))  MRSA PCR Screening     Status: None   Collection Time: 10/29/2014  5:17 PM  Result Value Ref Range Status   MRSA by PCR NEGATIVE NEGATIVE Final    Comment:        The GeneXpert MRSA Assay (FDA approved for NASAL specimens only), is one component of a comprehensive MRSA colonization surveillance program. It is not intended to diagnose MRSA infection nor to guide or monitor treatment for MRSA infections.   Urine culture     Status: None   Collection Time: 11/03/2014  7:02 PM  Result Value Ref Range Status   Specimen Description URINE, CATHETERIZED  Final   Special Requests NONE  Final   Colony Count   Final    60,000 COLONIES/ML Performed at [facility]    Culture   Final    KLEBSIELLA PNEUMONIAE Performed at [facility]    Report Status 11/03/2014 FINAL  Final   Organism ID, Bacteria KLEBSIELLA PNEUMONIAE  Final      Susceptibility   Klebsiella pneumoniae - MIC*    AMPICILLIN >=32 RESISTANT Resistant     CEFAZOLIN <=4 SENSITIVE Sensitive     CEFTRIAXONE <=1 SENSITIVE Sensitive     CIPROFLOXACIN <=0.25 SENSITIVE Sensitive     GENTAMICIN <=1 SENSITIVE Sensitive     LEVOFLOXACIN 1 SENSITIVE Sensitive     NITROFURANTOIN 64 INTERMEDIATE Intermediate     TOBRAMYCIN <=1 SENSITIVE Sensitive     TRIMETH/SULFA <=20 SENSITIVE Sensitive     PIP/TAZO 16 SENSITIVE Sensitive     * KLEBSIELLA PNEUMONIAE    Anti-infectives    Start     Dose/Rate Route Frequency Ordered Stop   11/01/14 2000  vancomycin (VANCOCIN) 1,250 mg in sodium chloride 0.9 % 250 mL IVPB     1,250 mg 166.7 mL/hr over 90 Minutes Intravenous Every 24 hours 11/07/2014 1849     11/01/14 0200  piperacillin-tazobactam (ZOSYN) IVPB 3.375 g     3.375 g 12.5 mL/hr over 240 Minutes Intravenous Every 8 hours 11/23/2014 1849     11/09/2014 1930  piperacillin-tazobactam (ZOSYN) IVPB 3.375 g     3.375 g 100 mL/hr over 30 Minutes Intravenous  Once 10/30/2014 1829 11/07/2014 2232   10/29/2014 1930  vancomycin (VANCOCIN) IVPB 1000 mg/200 mL premix  1,000 mg 200 mL/hr over 60 Minutes Intravenous  Once 11/03/2014 1829 11/06/2014 2301   11/12/2014 1900  vancomycin (VANCOCIN) 1,250 mg in sodium chloride 0.9 % 250 mL IVPB     1,250 mg 166.7 mL/hr over 90 Minutes Intravenous  Once 10/27/2014 1849 10/28/2014 2201   11/10/2014 1800  cefTRIAXone (ROCEPHIN) 1 g in dextrose 5 % 50 mL IVPB - Premix  Status:  Discontinued     1 g 100 mL/hr over 30 Minutes Intravenous Every 24 hours 11/04/2014 1716 10/30/2014 1829      Assessment:  Infectious Disease: Vanc/Zosyn for LLE cellulitis (due to edema and chronic venous stasis)and possibly UTI. WBC= 10.7>8, Afebrile, SCr= 1.91>2.23. CrCl ~ 29. UCx growing Klebsiella.  Still with significant leg swelling.  6/6 zosyn>> 6/6 vanco>  6/6 UCx: KLEBSIELLA PNEUMONIAE S Ancef  Goal of Therapy:  Vancomycin trough level 10-15 mcg/ml  Plan:  -Continue zosyn 3.375gm IV q8h -Vancomycin1250mg  IV q24hr  -Consider changing Abx to Ancef or Rocephin for cellulits AND UTI   Meagan Campbell, PharmD, BCPS Clinical Staff Pharmacist Pager [phone]  Meagan Campbell 11/03/2014,11:19 AM

## 2014-11-03 NOTE — Progress Notes (Signed)
Placed patient on CPAP on Auto with and min pressure of 5cmH2O and max pressure of 20cmH2O and 3L O2 bled in. Patient is tolerating well and RT will continue to monitor.

## 2014-11-03 NOTE — Clinical Social Work Note (Signed)
Clinical Social Work Assessment  Patient Details  Name: Meagan Campbell MRN: [id number] Date of Birth: March 05, 1941  Date of referral:  11/03/14               Reason for consult:  Facility Placement                Permission sought to share information with:  Family Supports Permission granted to share information::  Yes, Verbal Permission Granted  Name::     Meagan Campbell, husband   Housing/Transportation Living arrangements for the past 2 months:  Single Family Home Source of Information:  Spouse, Patient Patient Interpreter Needed:  None Criminal Activity/Legal Involvement Pertinent to Current Situation/Hospitalization:  No - Comment as needed Significant Relationships:  Spouse, Adult Children Lives with:  Spouse Do you feel safe going back to the place where you live?  Yes Need for family participation in patient care:  Yes (Comment)  Care giving concerns:  None    Social Worker assessment / plan:  CSW met the pt and the pt's husband Shanon Brow at the bedside. CSW introduced self and purpose of the visit. CSW discussed SNF rehab. CSW explained the SNF process. CSW explained insurance and its relation to SNF placement. The pt declined SNF list. CSW and pt discussed geographic location in which the she would like to receive rehab. The pt reported that she will like to go to [facility]. Pt reported that if she cannot go to [facility] she would like to go home. CSW answered all questions in which the pt/David inquired about. CSW will continue to follow this pt and assist with discharge as needed.   Employment status:  Retired Nurse, adult PT Recommendations:  [place] / Referral to community resources:  [place]  Patient/Family's Response to care:  Pt reported that the staff has treated her nice.   Patient/Family's Understanding of and Emotional Response to Diagnosis, Current Treatment, and Prognosis: Shanon Brow reported  the pt health remain the same. Shanon Brow reported that the pt has been dealing with her current diagnosis for some time.   Emotional Assessment Appearance:  Appears stated age Attitude/Demeanor/Rapport:   (Calm ) Affect (typically observed):  Appropriate Orientation:  Oriented to Situation, Oriented to  Time, Oriented to Place, Oriented to Self (Pt does forgot things at times) Alcohol / Substance use:  Not Applicable Psych involvement (Current and /or in the community):    No   Discharge Needs  Concerns to be addressed:  Denies Needs/Concerns at this time Readmission within the last 30 days:  No Current discharge risk:  None Barriers to Discharge:  No Barriers Identified   Demiya Magno, LCSW 11/03/2014, 2:55 PM

## 2014-11-03 NOTE — Care Management Note (Signed)
Case Management Note  Patient Details  Name: Meagan Campbell MRN: [id number] Date of Birth: [birth year]  Subjective/Objective:    Admitted with CHF                Action/Plan: CM following for DCP; SW following for SNF placement when medically stable  Expected Discharge Date:     Possibly 11/07/2014             Expected Discharge Plan:  [place]  In-House Referral:  Clinical Social Work  Discharge planning Services  CM Consult  Status of Service:  In process, will continue to follow  Medicare Important Message Given:  Yes Date Medicare IM Given:  11/03/14 Medicare IM give by:  Mindi Slicker RN  Royston Bake, RN,BSN,MHA [phone] 11/03/2014, 10:37 AM

## 2014-11-04 ENCOUNTER — Inpatient Hospital Stay (HOSPITAL_COMMUNITY): Payer: Medicare Other

## 2014-11-04 LAB — GLUCOSE, CAPILLARY
Glucose-Capillary: 154 mg/dL — ABNORMAL HIGH (ref 65–99)
Glucose-Capillary: 192 mg/dL — ABNORMAL HIGH (ref 65–99)
Glucose-Capillary: 186 mg/dL — ABNORMAL HIGH (ref 65–99)
Glucose-Capillary: 251 mg/dL — ABNORMAL HIGH (ref 65–99)

## 2014-11-04 LAB — RENAL FUNCTION PANEL
ALBUMIN: 2.2 g/dL — AB (ref 3.5–5.0)
Anion gap: 12 (ref 5–15)
BUN: 33 mg/dL — ABNORMAL HIGH (ref 6–20)
CO2: 35 mmol/L — ABNORMAL HIGH (ref 22–32)
Calcium: 8.4 mg/dL — ABNORMAL LOW (ref 8.9–10.3)
Chloride: 96 mmol/L — ABNORMAL LOW (ref 101–111)
Creatinine, Ser: 2.24 mg/dL — ABNORMAL HIGH (ref 0.44–1.00)
GFR, EST AFRICAN AMERICAN: 23 mL/min — AB (ref >60)
GFR, EST NON AFRICAN AMERICAN: 20 mL/min — AB (ref >60)
GLUCOSE: 157 mg/dL — AB (ref 65–99)
Phosphorus: 3.8 mg/dL (ref 2.5–4.6)
Potassium: 3.8 mmol/L (ref 3.5–5.1)
Sodium: 143 mmol/L (ref 135–145)

## 2014-11-04 LAB — BRAIN NATRIURETIC PEPTIDE: B Natriuretic Peptide: 374.1 pg/mL — ABNORMAL HIGH (ref 0.0–100.0)

## 2014-11-04 MED ORDER — FUROSEMIDE 10 MG/ML IJ SOLN
80.0000 mg | Freq: Two times a day (BID) | INTRAMUSCULAR | Status: DC
  Administered 2014-11-04 – 2014-11-05 (×2): 80 mg via INTRAVENOUS
  Filled 2014-11-04 (×2): qty 8

## 2014-11-04 MED ORDER — FERROUS SULFATE 325 (65 FE) MG PO TABS
325.0000 mg | ORAL_TABLET | Freq: Every day | ORAL | Status: DC
  Administered 2014-11-04 – 2014-11-08 (×4): 325 mg via ORAL
  Filled 2014-11-04 (×6): qty 1

## 2014-11-04 NOTE — Progress Notes (Signed)
Attempted and pt refuses TED hose.  Educated pt of the importance.

## 2014-11-04 NOTE — Progress Notes (Signed)
Inpatient Diabetes Program Recommendations  AACE/ADA: New Consensus Statement on Inpatient Glycemic Control (2013)  Target Ranges:  Prepandial:   less than 140 mg/dL      Peak postprandial:   less than 180 mg/dL (1-2 hours)      Critically ill patients:  140 - 180 mg/dL   Inpatient Diabetes Program Recommendations Insulin - Meal Coverage: add Novolog 3 units TID with meals per Glycemic Control order set Diet: add carb modified to current heart healthy diet Thank you  Raoul Pitch BSN, RN,CDE Inpatient Diabetes Coordinator [phone] (team pager)

## 2014-11-04 NOTE — Patient Outreach (Signed)
[name]) Care Management  11/04/2014  Meagan Campbell 21-Sep-1940 [id number]   Referral from Natividad Brood, RN requesting followup by SW at [facility], assigned [employer], LCSW.  Ronnell Freshwater. [place], [place] Management [place] Assistant Phone: [phone] Fax: [phone]

## 2014-11-04 NOTE — Progress Notes (Addendum)
Physical Therapy Treatment Patient Details Name: Meagan Campbell MRN: [id number] DOB: 07-29-1940 Today's Date: 11/04/2014    History of Present Illness Pt admitted with CHF, B LE cellulits and acute respiratory failure due to COPD. PMH: chronic anemia, CKD, DM, afib, HTN, OSA, obesity.    PT Comments    Pt slumped to right in bed on arrival with food on her shoulder. Pt stating she is finished with lunch but unaware against rail. Pt with decline in function for mobility and balance today per her report and comparison of our session to last P.T. Session. Pt reports she felt she had a decline since yesterday. Pt educated for mobility and HEP and discussed pt decreased function with RN Gabriel Cirri. Will continue to follow with 2 person assist to maximize function safely.  Follow Up Recommendations  SNF     Equipment Recommendations       Recommendations for Other Services       Precautions / Restrictions Precautions Precautions: Fall Precaution Comments: on chronic 02    Mobility  Bed Mobility Overal bed mobility: Needs Assistance;+2 for physical assistance Bed Mobility: Rolling;Sidelying to Sit Rolling: Max assist Sidelying to sit: Max assist   Sit to supine: Mod assist   General bed mobility comments: pt with HOB elevated, cues for moving legs to EOB with pt unable to perform. Cues and assist to roll fully to the side, total assist to bring legs to EOB and max assist to elevate trunk from surface with max cues and increased time. Max assist to scoot to EOB. With return to supine max  assist for bil LE and total assist of 2 to scoot toward [place] transfer comment: attempted to stand once EOB but pt unable to even initiate bil LE engagement with right anterior lean maintained throughout  Ambulation/Gait                 Stairs            Wheelchair Mobility    Modified Rankin (Stroke Patients Only)       Balance Overall  balance assessment: Needs assistance   Sitting balance-Leahy Scale: Poor                              Cognition Arousal/Alertness: Awake/alert Behavior During Therapy: Flat affect Overall Cognitive Status: No family/caregiver present to determine baseline cognitive functioning           Safety/Judgement: Decreased awareness of safety;Decreased awareness of deficits   Problem Solving: Slow processing;Requires verbal cues      Exercises General Exercises - Lower Extremity Short Arc Quad: AAROM;Both;15 reps;Supine Heel Slides: AAROM;Both;10 reps;Supine Hip ABduction/ADduction: AROM;Both;15 reps;Supine    General Comments        Pertinent Vitals/Pain Pain Assessment: 0-10 Pain Score: 4  Pain Location: bil LE with movement Pain Descriptors / Indicators: Aching Pain Intervention(s): Limited activity within patient's tolerance;Repositioned  94% on 4L at rest with notable wheezing    Home Living                      Prior Function            PT Goals (current goals can now be found in the care plan section) Progress towards PT goals: Not progressing toward goals - comment;Goals downgraded-see care plan    Frequency  PT Plan Current plan remains appropriate    Co-evaluation             End of Session Equipment Utilized During Treatment: Gait belt;Oxygen Activity Tolerance: Patient limited by fatigue Patient left: in bed;with call bell/phone within reach     Time: 3735-7897 PT Time Calculation (min) (ACUTE ONLY): 27 min  Charges:  $Therapeutic Exercise: 8-22 mins $Therapeutic Activity: 8-22 mins                    G Codes:      Melford Aase November 27, 2014, 1:46 PM Elwyn Reach, [place]

## 2014-11-04 NOTE — Progress Notes (Signed)
Pt refusing to wear cpap at this time. Pt remains on 3L [place]

## 2014-11-04 NOTE — Consult Note (Signed)
   [facility] CM Inpatient Consult   11/04/2014  Meagan Campbell [birth year] [id number]   Patient evaluated for [place] Management services. Came to bedside to explain and offer [facility] Care Management. She reports she lives with her husband. States she wears home o2 thru [place]. She denies having issues with affording medications or with transportation. States her husband drives her to her MD appointments. Confirms Primary Care MD as Dr. Delena Bali in [place]. Consents signed for [place] Management services. Noted therapy is recommending SNF.Will continue to follow and will update [facility] team if disposition changes.Will request for patient to be assigned in the interim for CHF management and [facility] LCSW follow up if patient goes to SNF. Will make inpatient RNCM aware.   Marthenia Rolling, MSN-Ed, RN,BSN [facility] Liaison [phone]

## 2014-11-04 NOTE — Progress Notes (Signed)
Patient's O2 sats were decreasing on CPAP so RN placed her on 5L [place] and Sats have risen to 92% RT will continue to monitor.

## 2014-11-04 NOTE — Progress Notes (Signed)
[facility] PROGRESS NOTE   Meagan Campbell [id number] DOB: 1941/05/16 DOA: 10/29/2014 PCP: Nicoletta Dress, MD  HPI/Subjective: Did not feel she is better today, feels he had some shortness of breath. Per nursing staff oxygen requirement increased to 5 L  Assessment/Plan: Principal Problem:   Acute diastolic CHF (congestive heart failure) Active Problems:   OSA (obstructive sleep apnea)   DM type 2, uncontrolled, with renal complications   Hyperlipidemia   Hypertension   CKD (chronic kidney disease) stage 4, GFR 15-29 ml/min   Morbid obesity   Acute exacerbation of CHF (congestive heart failure)   Pressure ulcer   Cellulitis of leg, right   Bilateral lower extremity edema   Acute respiratory failure - Presented with shortness of breath, significant lower extremity edema and elevated BNP. - Appears to be secondary to acute on chronic diastolic CHF. - Weight on admission 124 kg,? Weight today is 117, total diuresis she diuresed 3.8 L net negative. - The weight is 263 pounds since yesterday even with the intake/output net -4.3 L - Lasix decrease yesterday to 4 mg twice a day, I'll increase back to 80 mg twice a day.  Chronic respiratory failure secondary to COPD and obstructive sleep apnea - Provide broncho-dilators scheduled and as needed, provide CPAP at nighttime - Please note that patient is on oxygen at home and will continue so while inpatient - Was on 2 L of oxygen, desaturates, now needs 5 L, increase Lasix, incentive spirometry.  Acute bilateral lower extremity cellulitis - Secondary to edema imposed on bilateral chronic venous stasis changes - Redness improved already, de-escalate antibiotics to Rocephin.  Klebsiella UTI - Suggested based and urinalysis, urinalysis showing 60,000 CFUs. - On Rocephin.  Anemia of chronic disease, IDA, CKD - Hemoglobin 9, no signs of active bleeding - Continue iron supplementation - Repeat CBC in a.m.  Acute on  chronic kidney disease stage 3-4 - Baseline creatinine 1.8 - Continue diuresis with Lasix and monitor renal function closely  Diabetes mellitus type 2 with complications of chronic kidney disease - CBGs in 80s to 90s - Place on sliding scale insulin for now, check A1c  Atrial fibrillation, rate controlled - Continue amiodarone, Coreg, Imdur, on aspirin  Hypertension - Reasonably stable on admission - Continue Coreg, Imdur, hydralazine, Lasix  Hyperlipidemia - We'll check lipid panel, continue statin per home regimen  Hypothyroidism - Check TSH, continue Synthroid  Morbid obesity - Body mass index is 41.71 kg/(m^2).  Code Status: Full Code Family Communication: Plan discussed with the patient. Disposition Plan: Remains inpatient Diet: Diet Heart Room service appropriate?: Yes; Fluid consistency:: Thin; Fluid restriction:: 1200 mL Fluid  Consultants:  None  Procedures:  None  Antibiotics:  Zosyn vancomycin on admission.  Currently on Rocephin   Objective: Filed Vitals:   11/04/14 1019  BP: 144/36  Pulse: 81  Temp:   Resp:     Intake/Output Summary (Last 24 hours) at 11/04/14 1121 Last data filed at 11/04/14 1020  Gross per 24 hour  Intake   1957 ml  Output   1850 ml  Net    107 ml   Filed Weights   11/02/14 0423 11/03/14 0424 11/04/14 0833  Weight: 117 kg (257 lb 15 oz) 119.3 kg (263 lb 0.1 oz) 55.203 kg (121 lb 11.2 oz)    Exam: General: Alert and awake, oriented x3, not in any acute distress. HEENT: anicteric sclera, pupils reactive to light and accommodation, EOMI CVS: S1-S2 clear, no murmur rubs or gallops Chest: clear to  auscultation bilaterally, no wheezing, rales or rhonchi Abdomen: soft nontender, nondistended, normal bowel sounds, no organomegaly Extremities: no cyanosis, +2 pedal edema bilaterally Neuro: Cranial nerves II-XII intact, no focal neurological deficits  Data Reviewed: Basic Metabolic Panel:  Recent Labs Lab  11/04/2014 1302 11/01/14 0340 11/02/14 0408 11/03/14 0419 11/04/14 0311  NA 144 145 142 145 143  K 3.7 3.7 4.4 3.4* 3.8  CL 101 99* 98* 99* 96*  CO2 31 35* 31 35* 35*  GLUCOSE 94 134* 140* 149* 157*  BUN 37* 35* 35* 34* 33*  CREATININE 1.91* 2.02* 2.14* 2.23* 2.24*  CALCIUM 8.9 8.4* 8.4* 8.4* 8.4*  MG  --   --   --  2.0  --   PHOS  --   --   --   --  3.8   Liver Function Tests:  Recent Labs Lab 11/04/14 0311  ALBUMIN 2.2*   No results for input(s): LIPASE, AMYLASE in the last 168 hours. No results for input(s): AMMONIA in the last 168 hours. CBC:  Recent Labs Lab 11/13/2014 1302 11/01/14 0340 11/02/14 0408  WBC 10.7* 9.0 8.0  HGB 9.0* 8.2* 8.2*  HCT 30.5* 27.1* 27.8*  MCV 89.7 90.6 90.8  PLT 178 167 171   Cardiac Enzymes: No results for input(s): CKTOTAL, CKMB, CKMBINDEX, TROPONINI in the last 168 hours. BNP (last 3 results)  Recent Labs  11/09/2014 1302 11/02/14 0348  BNP 207.1* 298.9*    ProBNP (last 3 results)  Recent Labs  09/21/14 1236  PROBNP 233.0*    CBG:  Recent Labs Lab 11/03/14 0603 11/03/14 1134 11/03/14 1631 11/03/14 2042 11/04/14 0601  GLUCAP 141* 275* 176* 211* 154*    Micro Recent Results (from the past 240 hour(s))  MRSA PCR Screening     Status: None   Collection Time: 11/07/2014  5:17 PM  Result Value Ref Range Status   MRSA by PCR NEGATIVE NEGATIVE Final    Comment:        The GeneXpert MRSA Assay (FDA approved for NASAL specimens only), is one component of a comprehensive MRSA colonization surveillance program. It is not intended to diagnose MRSA infection nor to guide or monitor treatment for MRSA infections.   Urine culture     Status: None   Collection Time: 10/30/2014  7:02 PM  Result Value Ref Range Status   Specimen Description URINE, CATHETERIZED  Final   Special Requests NONE  Final   Colony Count   Final    60,000 COLONIES/ML Performed at [facility]    Culture   Final    KLEBSIELLA  PNEUMONIAE Performed at [facility]    Report Status 11/03/2014 FINAL  Final   Organism ID, Bacteria KLEBSIELLA PNEUMONIAE  Final      Susceptibility   Klebsiella pneumoniae - MIC*    AMPICILLIN >=32 RESISTANT Resistant     CEFAZOLIN <=4 SENSITIVE Sensitive     CEFTRIAXONE <=1 SENSITIVE Sensitive     CIPROFLOXACIN <=0.25 SENSITIVE Sensitive     GENTAMICIN <=1 SENSITIVE Sensitive     LEVOFLOXACIN 1 SENSITIVE Sensitive     NITROFURANTOIN 64 INTERMEDIATE Intermediate     TOBRAMYCIN <=1 SENSITIVE Sensitive     TRIMETH/SULFA <=20 SENSITIVE Sensitive     PIP/TAZO 16 SENSITIVE Sensitive     * KLEBSIELLA PNEUMONIAE     Studies: No results found.  Scheduled Meds: . amiodarone  200 mg Oral Daily  . aspirin  325 mg Oral Daily  . atorvastatin  20 mg Oral  q1800  . budesonide  0.25 mg Nebulization BID  . carbidopa-levodopa  1 tablet Oral TID  . carvedilol  6.25 mg Oral BID  . cefTRIAXone (ROCEPHIN)  IV  1 g Intravenous Q24H  . ferrous sulfate  325 mg Oral Q breakfast  . furosemide  40 mg Intravenous BID  . guaiFENesin  1,200 mg Oral BID  . hydrALAZINE  50 mg Oral TID  . insulin aspart  0-9 Units Subcutaneous TID WC  . ipratropium-albuterol  3 mL Nebulization BID  . isosorbide mononitrate  30 mg Oral Daily  . levothyroxine  50 mcg Oral QAC breakfast  . magnesium oxide  200 mg Oral BID  . pantoprazole  40 mg Oral Daily  . rOPINIRole  2 mg Oral QHS  . sodium chloride  3 mL Intravenous Q12H  . sodium chloride  3 mL Intravenous Q12H   Continuous Infusions:      Time spent: 35 minutes    [facility] A  [facility] Pager [phone] If 7PM-7AM, please contact night-coverage at [email or web address], password [facility] 11/04/2014, 11:21 AM  LOS: 4 days

## 2014-11-05 LAB — GLUCOSE, CAPILLARY
GLUCOSE-CAPILLARY: 234 mg/dL — AB (ref 65–99)
Glucose-Capillary: 171 mg/dL — ABNORMAL HIGH (ref 65–99)
Glucose-Capillary: 286 mg/dL — ABNORMAL HIGH (ref 65–99)
Glucose-Capillary: 168 mg/dL — ABNORMAL HIGH (ref 65–99)

## 2014-11-05 LAB — CBC
HCT: 27.8 % — ABNORMAL LOW (ref 36.0–46.0)
Hemoglobin: 8.1 g/dL — ABNORMAL LOW (ref 12.0–15.0)
MCH: 26.7 pg (ref 26.0–34.0)
MCHC: 29.1 g/dL — ABNORMAL LOW (ref 30.0–36.0)
MCV: 91.7 fL (ref 78.0–100.0)
Platelets: 222 K/uL (ref 150–400)
RBC: 3.03 MIL/uL — ABNORMAL LOW (ref 3.87–5.11)
RDW: 18.6 % — ABNORMAL HIGH (ref 11.5–15.5)
WBC: 11.4 K/uL — ABNORMAL HIGH (ref 4.0–10.5)

## 2014-11-05 LAB — BASIC METABOLIC PANEL
ANION GAP: 10 (ref 5–15)
BUN: 37 mg/dL — AB (ref 6–20)
CHLORIDE: 95 mmol/L — AB (ref 101–111)
CO2: 34 mmol/L — AB (ref 22–32)
Calcium: 8.5 mg/dL — ABNORMAL LOW (ref 8.9–10.3)
Creatinine, Ser: 2.27 mg/dL — ABNORMAL HIGH (ref 0.44–1.00)
GFR calc Af Amer: 23 mL/min — ABNORMAL LOW (ref >60)
GFR, EST NON AFRICAN AMERICAN: 20 mL/min — AB (ref >60)
GLUCOSE: 191 mg/dL — AB (ref 65–99)
POTASSIUM: 3.9 mmol/L (ref 3.5–5.1)
Sodium: 139 mmol/L (ref 135–145)

## 2014-11-05 MED ORDER — VANCOMYCIN HCL 10 G IV SOLR
1250.0000 mg | INTRAVENOUS | Status: DC
  Administered 2014-11-05 – 2014-11-07 (×3): 1250 mg via INTRAVENOUS
  Filled 2014-11-05 (×5): qty 1250

## 2014-11-05 MED ORDER — IPRATROPIUM-ALBUTEROL 0.5-2.5 (3) MG/3ML IN SOLN
3.0000 mL | Freq: Four times a day (QID) | RESPIRATORY_TRACT | Status: DC
  Administered 2014-11-05 – 2014-11-08 (×13): 3 mL via RESPIRATORY_TRACT
  Filled 2014-11-05 (×13): qty 3

## 2014-11-05 MED ORDER — PIPERACILLIN-TAZOBACTAM 3.375 G IVPB
3.3750 g | Freq: Three times a day (TID) | INTRAVENOUS | Status: DC
  Administered 2014-11-05 – 2014-11-08 (×10): 3.375 g via INTRAVENOUS
  Filled 2014-11-05 (×13): qty 50

## 2014-11-05 NOTE — Procedures (Signed)
Pt placed on CPAP for the night set to automode.  Mac-20, min-5. Pt is tolerating well and is comfortably.

## 2014-11-05 NOTE — Progress Notes (Signed)
Dr Hartford Poli aware of urine status. Ordered to hold iv lasix

## 2014-11-05 NOTE — Progress Notes (Signed)
Removed pt foley cath using sterile technique. Total overnight u/o 621ml.

## 2014-11-05 NOTE — Progress Notes (Signed)
Pt attempted to use bedpan (unable to get pt oob to [facility]) . Unable to urinate. Bladder scan show only 200 cc's urine

## 2014-11-05 NOTE — Progress Notes (Signed)
[facility] PROGRESS NOTE   Meagan Campbell LGX:[id number] DOB: [birth year] DOA: 10/26/2014 PCP: Nelda Bucks E, MD  HPI/Subjective: Low-grade fever, still feeling short of breath not back to her baseline. I will get blood cultures, broaden the antibiotics coverage.  Assessment/Plan:  Principal Problem:   Acute diastolic CHF (congestive heart failure) Active Problems:   OSA (obstructive sleep apnea)   DM type 2, uncontrolled, with renal complications   Hyperlipidemia   Hypertension   CKD (chronic kidney disease) stage 4, GFR 15-29 ml/min   Morbid obesity   Acute exacerbation of CHF (congestive heart failure)   Pressure ulcer   Cellulitis of leg, right   Bilateral lower extremity edema   Acute respiratory failure - Presented with shortness of breath, significant lower extremity edema and elevated BNP. - Appears to be secondary to acute on chronic diastolic CHF. - Weight on admission 124 kg,? Weight today is 117, total diuresis she diuresed 3.8 L net negative. - The weight is 263 pounds since yesterday even with the intake/output net -4.3 L - Chest x-ray showed persistent left lower lobe atelectasis versus infiltrates.  Chronic respiratory failure secondary to COPD and obstructive sleep apnea - Provide broncho-dilators scheduled and as needed, provide CPAP at nighttime - Please note that patient is on oxygen at home and will continue so while inpatient - Continue Lasix and incentive spirometry.  Acute on chronic diastolic CHF -Patient has acute CHF with preserved LVEF, lower extremity edema, elevated BNP and SOB. -Treated with beta blockers, Lasix and  Acute bilateral lower extremity cellulitis - Secondary to edema imposed on bilateral chronic venous stasis changes - Antibiotics de-escalate it to Rocephin and patient developed low-grade fever.  -Broaden antibiotics coverage to Zosyn and Pilar Plate.  Klebsiella UTI - Suggested based and urinalysis, urinalysis showing  60,000 CFUs. - On Rocephin, will be switched to Rocephin.  Anemia of chronic disease, IDA, CKD - Hemoglobin 9, no signs of active bleeding - Continue iron supplementation - Repeat CBC in a.m.  Acute on chronic kidney disease stage 3-4 - Baseline creatinine 1.8 - Continue diuresis with Lasix and monitor renal function closely  Diabetes mellitus type 2 with complications of chronic kidney disease - CBGs in 80s to 90s - Place on sliding scale insulin for now, check A1c  Atrial fibrillation, rate controlled - Continue amiodarone, Coreg, Imdur, on aspirin  Hypertension - Reasonably stable on admission - Continue Coreg, Imdur, hydralazine, Lasix  Hyperlipidemia - We'll check lipid panel, continue statin per home regimen  Hypothyroidism - Check TSH, continue Synthroid  Morbid obesity - Body mass index is 41.71 kg/(m^2).  Code Status: Full Code Family Communication: Plan discussed with the patient. Disposition Plan: Remains inpatient Diet: Diet Heart Room service appropriate?: Yes; Fluid consistency:: Thin; Fluid restriction:: 1200 mL Fluid  Consultants:  None  Procedures:  None  Antibiotics:  Zosyn vancomycin on admission.  Currently on Rocephin   Objective: Filed Vitals:   11/05/14 1002  BP: 143/28  Pulse:   Temp:   Resp:     Intake/Output Summary (Last 24 hours) at 11/05/14 1142 Last data filed at 11/05/14 1000  Gross per 24 hour  Intake   1452 ml  Output   1100 ml  Net    352 ml   Filed Weights   11/03/14 0424 11/04/14 0833 11/05/14 0515  Weight: 119.3 kg (263 lb 0.1 oz) 55.203 kg (121 lb 11.2 oz) 54.8 kg (120 lb 13 oz)    Exam: General: Alert and awake, oriented x3, not in any  acute distress. HEENT: anicteric sclera, pupils reactive to light and accommodation, EOMI CVS: S1-S2 clear, no murmur rubs or gallops Chest: clear to auscultation bilaterally, no wheezing, rales or rhonchi Abdomen: soft nontender, nondistended, normal bowel sounds, no  organomegaly Extremities: no cyanosis, +2 pedal edema bilaterally Neuro: Cranial nerves II-XII intact, no focal neurological deficits  Data Reviewed: Basic Metabolic Panel:  Recent Labs Lab 11/01/14 0340 11/02/14 0408 11/03/14 0419 11/04/14 0311 11/05/14 0232  NA 145 142 145 143 139  K 3.7 4.4 3.4* 3.8 3.9  CL 99* 98* 99* 96* 95*  CO2 35* 31 35* 35* 34*  GLUCOSE 134* 140* 149* 157* 191*  BUN 35* 35* 34* 33* 37*  CREATININE 2.02* 2.14* 2.23* 2.24* 2.27*  CALCIUM 8.4* 8.4* 8.4* 8.4* 8.5*  MG  --   --  2.0  --   --   PHOS  --   --   --  3.8  --    Liver Function Tests:  Recent Labs Lab 11/04/14 0311  ALBUMIN 2.2*   No results for input(s): LIPASE, AMYLASE in the last 168 hours. No results for input(s): AMMONIA in the last 168 hours. CBC:  Recent Labs Lab 11/21/2014 1302 11/01/14 0340 11/02/14 0408 11/05/14 0232  WBC 10.7* 9.0 8.0 11.4*  HGB 9.0* 8.2* 8.2* 8.1*  HCT 30.5* 27.1* 27.8* 27.8*  MCV 89.7 90.6 90.8 91.7  PLT 178 167 171 222   Cardiac Enzymes: No results for input(s): CKTOTAL, CKMB, CKMBINDEX, TROPONINI in the last 168 hours. BNP (last 3 results)  Recent Labs  11/22/2014 1302 11/02/14 0348 11/04/14 1310  BNP 207.1* 298.9* 374.1*    ProBNP (last 3 results)  Recent Labs  09/21/14 1236  PROBNP 233.0*    CBG:  Recent Labs Lab 11/04/14 1201 11/04/14 1631 11/04/14 2121 11/05/14 0610 11/05/14 1113  GLUCAP 192* 186* 251* 171* 286*    Micro Recent Results (from the past 240 hour(s))  MRSA PCR Screening     Status: None   Collection Time: 11/06/2014  5:17 PM  Result Value Ref Range Status   MRSA by PCR NEGATIVE NEGATIVE Final    Comment:        The GeneXpert MRSA Assay (FDA approved for NASAL specimens only), is one component of a comprehensive MRSA colonization surveillance program. It is not intended to diagnose MRSA infection nor to guide or monitor treatment for MRSA infections.   Urine culture     Status: None   Collection  Time: 11/07/2014  7:02 PM  Result Value Ref Range Status   Specimen Description URINE, CATHETERIZED  Final   Special Requests NONE  Final   Colony Count   Final    60,000 COLONIES/ML Performed at [facility]    Culture   Final    KLEBSIELLA PNEUMONIAE Performed at [facility]    Report Status 11/03/2014 FINAL  Final   Organism ID, Bacteria KLEBSIELLA PNEUMONIAE  Final      Susceptibility   Klebsiella pneumoniae - MIC*    AMPICILLIN >=32 RESISTANT Resistant     CEFAZOLIN <=4 SENSITIVE Sensitive     CEFTRIAXONE <=1 SENSITIVE Sensitive     CIPROFLOXACIN <=0.25 SENSITIVE Sensitive     GENTAMICIN <=1 SENSITIVE Sensitive     LEVOFLOXACIN 1 SENSITIVE Sensitive     NITROFURANTOIN 64 INTERMEDIATE Intermediate     TOBRAMYCIN <=1 SENSITIVE Sensitive     TRIMETH/SULFA <=20 SENSITIVE Sensitive     PIP/TAZO 16 SENSITIVE Sensitive     * KLEBSIELLA PNEUMONIAE  Studies: Dg Chest Port 1v Same Day  11/04/2014   CLINICAL DATA:  Shortness of breath.  EXAM: PORTABLE CHEST - 1 VIEW SAME DAY  COMPARISON:  11/22/2014 and priors  FINDINGS: The heart size and mediastinal contours are within normal limits for portable technique. There is stable scarring patchy airspace opacity obscuring the left hemidiaphragm. Lung volumes are low, without evidence of new focal airspace consolidation, pleural effusion or pneumothorax. There is mild increase of the vascular markings. The visualized skeletal structures demonstrate no acute abnormality. Left-sided rib deformities are stable. Lower cervical spine fusion is partially visualized.  IMPRESSION: No evidence of acute cardiopulmonary disease.  Low lung volumes with mild crowding of the vascular markings, which may be seen with mild pulmonary vascular congestion.  Persistent atelectasis versus airspace consolidation of the left lower lobe.   Electronically Signed   By: Fidela Salisbury M.D.   On: 11/04/2014 12:07    Scheduled Meds: . amiodarone  200  mg Oral Daily  . aspirin  325 mg Oral Daily  . atorvastatin  20 mg Oral q1800  . budesonide  0.25 mg Nebulization BID  . carbidopa-levodopa  1 tablet Oral TID  . carvedilol  6.25 mg Oral BID  . cefTRIAXone (ROCEPHIN)  IV  1 g Intravenous Q24H  . ferrous sulfate  325 mg Oral Q breakfast  . furosemide  80 mg Intravenous BID  . guaiFENesin  1,200 mg Oral BID  . hydrALAZINE  50 mg Oral TID  . insulin aspart  0-9 Units Subcutaneous TID WC  . ipratropium-albuterol  3 mL Nebulization BID  . isosorbide mononitrate  30 mg Oral Daily  . levothyroxine  50 mcg Oral QAC breakfast  . magnesium oxide  200 mg Oral BID  . pantoprazole  40 mg Oral Daily  . rOPINIRole  2 mg Oral QHS  . sodium chloride  3 mL Intravenous Q12H  . sodium chloride  3 mL Intravenous Q12H   Continuous Infusions:      Time spent: 35 minutes    [facility] A  [facility] Pager [phone] If 7PM-7AM, please contact night-coverage at [email or web address], password [facility] 11/05/2014, 11:42 AM  LOS: 5 days

## 2014-11-05 NOTE — Progress Notes (Signed)
Pts weight noted to be erratic. Pt reweighed on bed and received different weights. Bed replaced and rezeroed- weight shows 125.7 kg

## 2014-11-05 NOTE — Progress Notes (Signed)
ANTIBIOTIC CONSULT NOTE - FOLLOW UP  Pharmacy Consult for Vancomycin and Zosyn Indication: empiric for low-grade fever  Allergies  Allergen Reactions  . Codeine     nausea  . Darvon [Propoxyphene]     nausea    Patient Measurements: Height: 5\' 8"  (172.7 cm) Weight: 120 lb 13 oz (54.8 kg) IBW/kg (Calculated) : 63.9 Actual weight:  275 lbs (122.5 kg) on admit 6/6.  Vital Signs: Temp: 100.6 F (38.1 C) (06/11 1300) Temp Source: Oral (06/11 1300) BP: 137/38 mmHg (06/11 1300) Pulse Rate: 75 (06/11 1300) Intake/Output from previous day: 06/10 0701 - 06/11 0700 In: 1579 [P.O.:1469; IV Piggyback:50] Out: 1350 [Urine:1350]  Labs:  Recent Labs  11/03/14 0419 11/04/14 0311 11/05/14 0232  WBC  --   --  11.4*  HGB  --   --  8.1*  PLT  --   --  222  CREATININE 2.23* 2.24* 2.27*   Microbiology: Recent Results (from the past 720 hour(s))  MRSA PCR Screening     Status: None   Collection Time: 11/21/2014  5:17 PM  Result Value Ref Range Status   MRSA by PCR NEGATIVE NEGATIVE Final    Comment:        The GeneXpert MRSA Assay (FDA approved for NASAL specimens only), is one component of a comprehensive MRSA colonization surveillance program. It is not intended to diagnose MRSA infection nor to guide or monitor treatment for MRSA infections.   Urine culture     Status: None   Collection Time: 11/21/2014  7:02 PM  Result Value Ref Range Status   Specimen Description URINE, CATHETERIZED  Final   Special Requests NONE  Final   Colony Count   Final    60,000 COLONIES/ML Performed at [facility]    Culture   Final    KLEBSIELLA PNEUMONIAE Performed at [facility]    Report Status 11/03/2014 FINAL  Final   Organism ID, Bacteria KLEBSIELLA PNEUMONIAE  Final      Susceptibility   Klebsiella pneumoniae - MIC*    AMPICILLIN >=32 RESISTANT Resistant     CEFAZOLIN <=4 SENSITIVE Sensitive     CEFTRIAXONE <=1 SENSITIVE Sensitive     CIPROFLOXACIN <=0.25  SENSITIVE Sensitive     GENTAMICIN <=1 SENSITIVE Sensitive     LEVOFLOXACIN 1 SENSITIVE Sensitive     NITROFURANTOIN 64 INTERMEDIATE Intermediate     TOBRAMYCIN <=1 SENSITIVE Sensitive     TRIMETH/SULFA <=20 SENSITIVE Sensitive     PIP/TAZO 16 SENSITIVE Sensitive     * KLEBSIELLA PNEUMONIAE   Assessment:   To resume Vanc and Zosyn due to low-grade fever today, tmax 100.6. WBC 11.4. Was on Vanc and Zosyn 6/6 thru 6/9, when changed to Ceftriaxone as urine culture grew Klebsiella.   BUN/creatinine about the same. Estimated crcl ~35 ml/min  Goal of Therapy:  Vancomycin trough level 15-20 mcg/ml appropriate Zosyn dose for renal function and infection  Plan:   Resume Vancomycin 1250 mg IV q24hrs.  Resume Zosyn 3.375 gm IV q8hrs (each over 4 hours).  Follow renal function, culture data, progress.  Arty Baumgartner, [place] Pager: [phone] 11/05/2014,2:22 PM

## 2014-11-06 LAB — BASIC METABOLIC PANEL
Anion gap: 11 (ref 5–15)
BUN: 38 mg/dL — AB (ref 6–20)
CO2: 37 mmol/L — ABNORMAL HIGH (ref 22–32)
CREATININE: 2.17 mg/dL — AB (ref 0.44–1.00)
Calcium: 9 mg/dL (ref 8.9–10.3)
Chloride: 97 mmol/L — ABNORMAL LOW (ref 101–111)
GFR calc Af Amer: 24 mL/min — ABNORMAL LOW (ref >60)
GFR calc non Af Amer: 21 mL/min — ABNORMAL LOW (ref >60)
GLUCOSE: 225 mg/dL — AB (ref 65–99)
Potassium: 3.9 mmol/L (ref 3.5–5.1)
SODIUM: 145 mmol/L (ref 135–145)

## 2014-11-06 LAB — CBC
HCT: 26.9 % — ABNORMAL LOW (ref 36.0–46.0)
Hemoglobin: 7.8 g/dL — ABNORMAL LOW (ref 12.0–15.0)
MCH: 26.6 pg (ref 26.0–34.0)
MCHC: 29 g/dL — AB (ref 30.0–36.0)
MCV: 91.8 fL (ref 78.0–100.0)
PLATELETS: 210 10*3/uL (ref 150–400)
RBC: 2.93 MIL/uL — ABNORMAL LOW (ref 3.87–5.11)
RDW: 18.4 % — AB (ref 11.5–15.5)
WBC: 11 10*3/uL — ABNORMAL HIGH (ref 4.0–10.5)

## 2014-11-06 LAB — URINE MICROSCOPIC-ADD ON

## 2014-11-06 LAB — URINALYSIS, ROUTINE W REFLEX MICROSCOPIC
BILIRUBIN URINE: NEGATIVE
Glucose, UA: NEGATIVE mg/dL
Hgb urine dipstick: NEGATIVE
Ketones, ur: NEGATIVE mg/dL
NITRITE: NEGATIVE
PROTEIN: NEGATIVE mg/dL
SPECIFIC GRAVITY, URINE: 1.02 (ref 1.005–1.030)
Urobilinogen, UA: 0.2 mg/dL (ref 0.0–1.0)
pH: 5.5 (ref 5.0–8.0)

## 2014-11-06 LAB — GLUCOSE, CAPILLARY
GLUCOSE-CAPILLARY: 247 mg/dL — AB (ref 65–99)
Glucose-Capillary: 204 mg/dL — ABNORMAL HIGH (ref 65–99)
Glucose-Capillary: 282 mg/dL — ABNORMAL HIGH (ref 65–99)
Glucose-Capillary: 236 mg/dL — ABNORMAL HIGH (ref 65–99)

## 2014-11-06 MED ORDER — FUROSEMIDE 10 MG/ML IJ SOLN
40.0000 mg | Freq: Two times a day (BID) | INTRAMUSCULAR | Status: DC
  Administered 2014-11-06 (×2): 40 mg via INTRAVENOUS
  Filled 2014-11-06 (×2): qty 4

## 2014-11-06 NOTE — Progress Notes (Signed)
While changing/bathing patient this morning it was noted on her right buttocks a large area of questionable deep tissue injury.  Patient denies any pain, patient placed to left side to remove pressure from site. Will request WOC. Will continue to monitor. Tresa Endo

## 2014-11-06 NOTE — Consult Note (Signed)
WOC wound consult note Reason for Consult: sacral ulcer Wound type: sDTI (supected deep tissue injury) right buttock, extends into the gluteal cleft.  sDTI (suspected deep tissue injury) left lower buttock  Pressure Ulcer POA: Yes, area on buttock documented 10/28/2014 Measurement:right buttock/gluteal cleft:11cmx 12cmx 0; right lower buttock 0.5cm x 0.5cm x 0 Wound BMW:UXLKG buttock dark maroon, blood filled blister mostly reaborbed now. Purple area on the left lower buttock  Drainage (amount, consistency, odor) none Periwound: intact  Dressing procedure/placement/frequency: Air mattress in place for offloading, pt bedbound at home, reports she has air mattress at home Foam dressing to protect, but staff to assess each shift.  Noted at the time of my assessment that she also has some intertriginous skin issues, red but not overtly fungal at this time. Will add antimicrobial wicking fabric to manage this and prevent this area from worsening.    [facility] team will follow along with you for weekly wound assessments.  Please notify me of any acute changes in the wounds or any new areas of concerns Para March RN,CWOCN [phone]

## 2014-11-06 NOTE — Progress Notes (Signed)
[facility] PROGRESS NOTE   Kimiah Hibner HFW:[id number] DOB: [birth year] DOA: 11/02/2014 PCP: Nicoletta Dress, MD  HPI/Subjective: Seen with husband at bedside, had a temperature 100.6 yesterday, recultured and antibiotics changed to vancomycin and Zosyn. Per nursing staff patient does not make any effort to move. Has some bruise around her buttock versus deep tissue injury, WOC consult and air mattress  Assessment/Plan:  Principal Problem:   Acute diastolic CHF (congestive heart failure) Active Problems:   OSA (obstructive sleep apnea)   DM type 2, uncontrolled, with renal complications   Hyperlipidemia   Hypertension   CKD (chronic kidney disease) stage 4, GFR 15-29 ml/min   Morbid obesity   Acute exacerbation of CHF (congestive heart failure)   Pressure ulcer   Cellulitis of leg, right   Bilateral lower extremity edema   Acute on chronic respiratory failure - Presented with shortness of breath, significant lower extremity edema and elevated BNP. - Appears to be secondary to acute on chronic diastolic CHF. - Weight on admission 124 kg,? Weight today is 117, total diuresis she diuresed 3.8 L net negative. - The weight is 263 pounds since yesterday even with the intake/output net -4.3 L - Chest x-ray showed persistent left lower lobe atelectasis versus infiltrates. Continue oxygen.  Fever -Patient was on vancomycin and Zosyn initially for UTI and right lower extremity cellulitis. -Antibiotics were switched to Rocephin, patient spiked fever and was not improving. -Urine and blood cultures reobtained and patient was placed back on Zosyn and vancomycin..  Acute on chronic diastolic CHF -Patient has acute CHF with preserved LVEF, lower extremity edema, elevated BNP and SOB. -Treated with beta bloand Lasix. -Lasix was held 1 day yesterday, had lower extremity edema buildup again. Restart Lasix.  Acute bilateral lower extremity cellulitis - Secondary to edema imposed  on bilateral chronic venous stasis changes - Antibiotics de-escalate it to Rocephin and patient developed low-grade fever.  -Broaden antibiotics coverage to Zosyn and Vancomycin  Klebsiella UTI - Suggested based and urinalysis, urinalysis showing 60,000 CFUs. - On Rocephinswitched back to Zosyn and UA and urine culture reobtained.  Anemia of chronic disease, IDA, CKD - Hemoglobin 9, no signs of active bleeding - Continue iron supplementation - Repeat CBC in a.m.  Acute on chronic kidney disease stage 3-4 - Baseline creatinine 1.9 from 10/30/2014  - Continue diuresis with Lasix and monitor renal function closely  Diabetes mellitus type 2 with complications of chronic kidney disease - CBGs in 80s to 90s - Place on sliding scale insulin for now, check A1c  Atrial fibrillation, rate controlled - Continue amiodarone, Coreg, Imdur, on aspirin  Hypertension - Reasonably stable on admission - Continue Coreg, Imdur, hydralazine, Lasix  Hyperlipidemia - We'll check lipid panel, continue statin per home regimen  Hypothyroidism TSH is 3.501 within normal limits, continue Synthroid  [place]edsore -Sacral decubitus ulcer, air mattress and wound care team.  Morbid obesity - Body mass index is 41.71 kg/(m^2).  Code Status: Full Code Family Communication: Plan discussed with the patient. Disposition Plan: Remains inpatient Diet: Diet Heart Room service appropriate?: Yes; Fluid consistency:: Thin; Fluid restriction:: 1200 mL Fluid  Consultants:  None  Procedures:  None  Antibiotics:  Zosyn & vancomycin    Objective: Filed Vitals:   11/06/14 1042  BP: 166/40  Pulse:   Temp:   Resp:     Intake/Output Summary (Last 24 hours) at 11/06/14 1100 Last data filed at 11/06/14 0650  Gross per 24 hour  Intake   1240 ml  Output  50 ml  Net   1190 ml   Filed Weights   11/05/14 0515 11/05/14 1831 11/06/14 0554  Weight: 54.8 kg (120 lb 13 oz) 125.7 kg (277 lb 1.9 oz) 124.6 kg  (274 lb 11.1 oz)    Exam: General: Alert and awake, oriented x3, not in any acute distress. HEENT: anicteric sclera, pupils reactive to light and accommodation, EOMI CVS: S1-S2 clear, no murmur rubs or gallops Chest: clear to auscultation bilaterally, no wheezing, rales or rhonchi Abdomen: soft nontender, nondistended, normal bowel sounds, no organomegaly Extremities: no cyanosis, +2 pedal edema bilaterally Neuro: Cranial nerves II-XII intact, no focal neurological deficits  Data Reviewed: Basic Metabolic Panel:  Recent Labs Lab 11/02/14 0408 11/03/14 0419 11/04/14 0311 11/05/14 0232 11/06/14 0312  NA 142 145 143 139 145  K 4.4 3.4* 3.8 3.9 3.9  CL 98* 99* 96* 95* 97*  CO2 31 35* 35* 34* 37*  GLUCOSE 140* 149* 157* 191* 225*  BUN 35* 34* 33* 37* 38*  CREATININE 2.14* 2.23* 2.24* 2.27* 2.17*  CALCIUM 8.4* 8.4* 8.4* 8.5* 9.0  MG  --  2.0  --   --   --   PHOS  --   --  3.8  --   --    Liver Function Tests:  Recent Labs Lab 11/04/14 0311  ALBUMIN 2.2*   No results for input(s): LIPASE, AMYLASE in the last 168 hours. No results for input(s): AMMONIA in the last 168 hours. CBC:  Recent Labs Lab 10/29/2014 1302 11/01/14 0340 11/02/14 0408 11/05/14 0232 11/06/14 0312  WBC 10.7* 9.0 8.0 11.4* 11.0*  HGB 9.0* 8.2* 8.2* 8.1* 7.8*  HCT 30.5* 27.1* 27.8* 27.8* 26.9*  MCV 89.7 90.6 90.8 91.7 91.8  PLT 178 167 171 222 210   Cardiac Enzymes: No results for input(s): CKTOTAL, CKMB, CKMBINDEX, TROPONINI in the last 168 hours. BNP (last 3 results)  Recent Labs  11/21/2014 1302 11/02/14 0348 11/04/14 1310  BNP 207.1* 298.9* 374.1*    ProBNP (last 3 results)  Recent Labs  09/21/14 1236  PROBNP 233.0*    CBG:  Recent Labs Lab 11/05/14 0610 11/05/14 1113 11/05/14 1639 11/05/14 2141 11/06/14 0543  GLUCAP 171* 286* 168* 234* 204*    Micro Recent Results (from the past 240 hour(s))  MRSA PCR Screening     Status: None   Collection Time: 11/20/2014  5:17 PM   Result Value Ref Range Status   MRSA by PCR NEGATIVE NEGATIVE Final    Comment:        The GeneXpert MRSA Assay (FDA approved for NASAL specimens only), is one component of a comprehensive MRSA colonization surveillance program. It is not intended to diagnose MRSA infection nor to guide or monitor treatment for MRSA infections.   Urine culture     Status: None   Collection Time: 11/14/2014  7:02 PM  Result Value Ref Range Status   Specimen Description URINE, CATHETERIZED  Final   Special Requests NONE  Final   Colony Count   Final    60,000 COLONIES/ML Performed at [facility]    Culture   Final    KLEBSIELLA PNEUMONIAE Performed at [facility]    Report Status 11/03/2014 FINAL  Final   Organism ID, Bacteria KLEBSIELLA PNEUMONIAE  Final      Susceptibility   Klebsiella pneumoniae - MIC*    AMPICILLIN >=32 RESISTANT Resistant     CEFAZOLIN <=4 SENSITIVE Sensitive     CEFTRIAXONE <=1 SENSITIVE Sensitive  CIPROFLOXACIN <=0.25 SENSITIVE Sensitive     GENTAMICIN <=1 SENSITIVE Sensitive     LEVOFLOXACIN 1 SENSITIVE Sensitive     NITROFURANTOIN 64 INTERMEDIATE Intermediate     TOBRAMYCIN <=1 SENSITIVE Sensitive     TRIMETH/SULFA <=20 SENSITIVE Sensitive     PIP/TAZO 16 SENSITIVE Sensitive     * KLEBSIELLA PNEUMONIAE  Culture, blood (routine x 2)     Status: None (Preliminary result)   Collection Time: 11/05/14  4:52 PM  Result Value Ref Range Status   Specimen Description BLOOD RIGHT ANTECUBITAL  Final   Special Requests BOTTLES DRAWN AEROBIC AND ANAEROBIC 10CC  Final   Culture   Final           BLOOD CULTURE RECEIVED NO GROWTH TO DATE CULTURE WILL BE HELD FOR 5 DAYS BEFORE ISSUING A FINAL NEGATIVE REPORT Performed at [facility]    Report Status PENDING  Incomplete  Culture, blood (routine x 2)     Status: None (Preliminary result)   Collection Time: 11/05/14  5:15 PM  Result Value Ref Range Status   Specimen Description BLOOD BLOOD RIGHT  ARM  Final   Special Requests BOTTLES DRAWN AEROBIC AND ANAEROBIC 10CC  Final   Culture   Final           BLOOD CULTURE RECEIVED NO GROWTH TO DATE CULTURE WILL BE HELD FOR 5 DAYS BEFORE ISSUING A FINAL NEGATIVE REPORT Performed at [facility]    Report Status PENDING  Incomplete     Studies: Dg Chest Port 1v Same Day  11/04/2014   CLINICAL DATA:  Shortness of breath.  EXAM: PORTABLE CHEST - 1 VIEW SAME DAY  COMPARISON:  11/11/2014 and priors  FINDINGS: The heart size and mediastinal contours are within normal limits for portable technique. There is stable scarring patchy airspace opacity obscuring the left hemidiaphragm. Lung volumes are low, without evidence of new focal airspace consolidation, pleural effusion or pneumothorax. There is mild increase of the vascular markings. The visualized skeletal structures demonstrate no acute abnormality. Left-sided rib deformities are stable. Lower cervical spine fusion is partially visualized.  IMPRESSION: No evidence of acute cardiopulmonary disease.  Low lung volumes with mild crowding of the vascular markings, which may be seen with mild pulmonary vascular congestion.  Persistent atelectasis versus airspace consolidation of the left lower lobe.   Electronically Signed   By: Fidela Salisbury M.D.   On: 11/04/2014 12:07    Scheduled Meds: . amiodarone  200 mg Oral Daily  . aspirin  325 mg Oral Daily  . atorvastatin  20 mg Oral q1800  . budesonide  0.25 mg Nebulization BID  . carbidopa-levodopa  1 tablet Oral TID  . carvedilol  6.25 mg Oral BID  . ferrous sulfate  325 mg Oral Q breakfast  . guaiFENesin  1,200 mg Oral BID  . hydrALAZINE  50 mg Oral TID  . insulin aspart  0-9 Units Subcutaneous TID WC  . ipratropium-albuterol  3 mL Nebulization QID  . isosorbide mononitrate  30 mg Oral Daily  . levothyroxine  50 mcg Oral QAC breakfast  . magnesium oxide  200 mg Oral BID  . pantoprazole  40 mg Oral Daily  . piperacillin-tazobactam  (ZOSYN)  IV  3.375 g Intravenous 3 times per day  . rOPINIRole  2 mg Oral QHS  . sodium chloride  3 mL Intravenous Q12H  . sodium chloride  3 mL Intravenous Q12H  . vancomycin  1,250 mg Intravenous Q24H   Continuous  Infusions:      Time spent: 35 minutes    [facility] A  [facility] Pager [phone] If 7PM-7AM, please contact night-coverage at [email or web address], password [facility] 11/06/2014, 11:00 AM  LOS: 6 days

## 2014-11-07 ENCOUNTER — Inpatient Hospital Stay (HOSPITAL_COMMUNITY): Payer: Medicare Other

## 2014-11-07 ENCOUNTER — Inpatient Hospital Stay (HOSPITAL_COMMUNITY): Payer: Medicare Other | Admitting: Certified Registered Nurse Anesthetist

## 2014-11-07 DIAGNOSIS — J9601 Acute respiratory failure with hypoxia: Secondary | ICD-10-CM

## 2014-11-07 DIAGNOSIS — R570 Cardiogenic shock: Secondary | ICD-10-CM

## 2014-11-07 DIAGNOSIS — I469 Cardiac arrest, cause unspecified: Secondary | ICD-10-CM | POA: Insufficient documentation

## 2014-11-07 DIAGNOSIS — I5031 Acute diastolic (congestive) heart failure: Secondary | ICD-10-CM

## 2014-11-07 LAB — BASIC METABOLIC PANEL
ANION GAP: 10 (ref 5–15)
Anion gap: 13 (ref 5–15)
BUN: 46 mg/dL — ABNORMAL HIGH (ref 6–20)
BUN: 51 mg/dL — AB (ref 6–20)
CALCIUM: 9 mg/dL (ref 8.9–10.3)
CHLORIDE: 98 mmol/L — AB (ref 101–111)
CO2: 34 mmol/L — ABNORMAL HIGH (ref 22–32)
CO2: 29 mmol/L (ref 22–32)
Calcium: 8.6 mg/dL — ABNORMAL LOW (ref 8.9–10.3)
Chloride: 97 mmol/L — ABNORMAL LOW (ref 101–111)
Creatinine, Ser: 2.1 mg/dL — ABNORMAL HIGH (ref 0.44–1.00)
Creatinine, Ser: 2.47 mg/dL — ABNORMAL HIGH (ref 0.44–1.00)
GFR calc Af Amer: 25 mL/min — ABNORMAL LOW (ref >60)
GFR calc Af Amer: 21 mL/min — ABNORMAL LOW (ref >60)
GFR calc non Af Amer: 18 mL/min — ABNORMAL LOW (ref >60)
GFR, EST NON AFRICAN AMERICAN: 22 mL/min — AB (ref >60)
GLUCOSE: 350 mg/dL — AB (ref 65–99)
Glucose, Bld: 232 mg/dL — ABNORMAL HIGH (ref 65–99)
POTASSIUM: 4.1 mmol/L (ref 3.5–5.1)
POTASSIUM: 4.4 mmol/L (ref 3.5–5.1)
SODIUM: 141 mmol/L (ref 135–145)
SODIUM: 140 mmol/L (ref 135–145)

## 2014-11-07 LAB — CBC
HCT: 26.1 % — ABNORMAL LOW (ref 36.0–46.0)
Hemoglobin: 7.4 g/dL — ABNORMAL LOW (ref 12.0–15.0)
MCH: 26 pg (ref 26.0–34.0)
MCHC: 28.4 g/dL — ABNORMAL LOW (ref 30.0–36.0)
MCV: 91.6 fL (ref 78.0–100.0)
PLATELETS: 229 10*3/uL (ref 150–400)
RBC: 2.85 MIL/uL — ABNORMAL LOW (ref 3.87–5.11)
RDW: 18 % — AB (ref 11.5–15.5)
WBC: 10.6 10*3/uL — AB (ref 4.0–10.5)

## 2014-11-07 LAB — URINALYSIS, ROUTINE W REFLEX MICROSCOPIC
Bilirubin Urine: NEGATIVE
Glucose, UA: NEGATIVE mg/dL
Hgb urine dipstick: NEGATIVE
Ketones, ur: NEGATIVE mg/dL
NITRITE: NEGATIVE
PROTEIN: NEGATIVE mg/dL
Specific Gravity, Urine: 1.015 (ref 1.005–1.030)
Urobilinogen, UA: 0.2 mg/dL (ref 0.0–1.0)
pH: 5 (ref 5.0–8.0)

## 2014-11-07 LAB — GLUCOSE, CAPILLARY
GLUCOSE-CAPILLARY: 268 mg/dL — AB (ref 65–99)
GLUCOSE-CAPILLARY: 182 mg/dL — AB (ref 65–99)
Glucose-Capillary: 224 mg/dL — ABNORMAL HIGH (ref 65–99)
Glucose-Capillary: 278 mg/dL — ABNORMAL HIGH (ref 65–99)
Glucose-Capillary: 328 mg/dL — ABNORMAL HIGH (ref 65–99)

## 2014-11-07 LAB — BLOOD GAS, ARTERIAL
ACID-BASE EXCESS: 4.1 mmol/L — AB (ref 0.0–2.0)
Bicarbonate: 29.8 mEq/L — ABNORMAL HIGH (ref 20.0–24.0)
DRAWN BY: 277331
FIO2: 1 %
O2 SAT: 94.6 %
PATIENT TEMPERATURE: 89.6
PEEP: 5 cmH2O
PO2 ART: 58.4 mmHg — AB (ref 80.0–100.0)
RATE: 18 resp/min
TCO2: 31.6 mmol/L (ref 0–100)
VT: 500 mL
pCO2 arterial: 46.6 mmHg — ABNORMAL HIGH (ref 35.0–45.0)
pH, Arterial: 7.391 (ref 7.350–7.450)

## 2014-11-07 LAB — URINE MICROSCOPIC-ADD ON

## 2014-11-07 LAB — TROPONIN I
TROPONIN I: 0.12 ng/mL — AB (ref <0.031)
TROPONIN I: 0.14 ng/mL — AB (ref <0.031)
Troponin I: 0.15 ng/mL — ABNORMAL HIGH (ref <0.031)

## 2014-11-07 LAB — MRSA PCR SCREENING: MRSA by PCR: NEGATIVE

## 2014-11-07 MED ORDER — VITAL HIGH PROTEIN PO LIQD
1000.0000 mL | ORAL | Status: DC
  Filled 2014-11-07: qty 1000

## 2014-11-07 MED ORDER — SODIUM CHLORIDE 0.9 % IJ SOLN: 10.0000 mL | INTRAMUSCULAR | Status: DC | PRN

## 2014-11-07 MED ORDER — NOREPINEPHRINE BITARTRATE 1 MG/ML IV SOLN
2.0000 ug/min | INTRAVENOUS | Status: DC
  Administered 2014-11-07: 10 ug/min via INTRAVENOUS
  Filled 2014-11-07: qty 4

## 2014-11-07 MED ORDER — ROCURONIUM BROMIDE 50 MG/5ML IV SOLN
INTRAVENOUS | Status: AC
  Filled 2014-11-07: qty 2

## 2014-11-07 MED ORDER — ETOMIDATE 2 MG/ML IV SOLN
INTRAVENOUS | Status: AC
  Filled 2014-11-07: qty 20

## 2014-11-07 MED ORDER — SODIUM CHLORIDE 0.9 % IJ SOLN
10.0000 mL | Freq: Two times a day (BID) | INTRAMUSCULAR | Status: DC
  Administered 2014-11-07: 10 mL
  Administered 2014-11-07: 30 mL
  Administered 2014-11-08: 10 mL

## 2014-11-07 MED ORDER — INSULIN ASPART 100 UNIT/ML ~~LOC~~ SOLN
0.0000 [IU] | SUBCUTANEOUS | Status: DC
  Administered 2014-11-07: 5 [IU] via SUBCUTANEOUS
  Administered 2014-11-07: 7 [IU] via SUBCUTANEOUS
  Administered 2014-11-07: 2 [IU] via SUBCUTANEOUS
  Administered 2014-11-08: 1 [IU] via SUBCUTANEOUS
  Administered 2014-11-08 (×3): 2 [IU] via SUBCUTANEOUS

## 2014-11-07 MED ORDER — LIDOCAINE HCL (CARDIAC) 20 MG/ML IV SOLN
INTRAVENOUS | Status: AC
  Filled 2014-11-07: qty 5

## 2014-11-07 MED ORDER — CHLORHEXIDINE GLUCONATE 0.12 % MT SOLN
15.0000 mL | Freq: Two times a day (BID) | OROMUCOSAL | Status: DC
  Administered 2014-11-07 – 2014-11-08 (×3): 15 mL via OROMUCOSAL
  Filled 2014-11-07 (×3): qty 15

## 2014-11-07 MED ORDER — CETYLPYRIDINIUM CHLORIDE 0.05 % MT LIQD
7.0000 mL | Freq: Four times a day (QID) | OROMUCOSAL | Status: DC
  Administered 2014-11-07 – 2014-11-08 (×6): 7 mL via OROMUCOSAL

## 2014-11-07 MED ORDER — SODIUM CHLORIDE 0.9 % IV SOLN
1000.0000 mg | Freq: Two times a day (BID) | INTRAVENOUS | Status: DC
  Administered 2014-11-07 – 2014-11-08 (×2): 1000 mg via INTRAVENOUS
  Filled 2014-11-07 (×3): qty 10

## 2014-11-07 MED ORDER — MIDAZOLAM HCL 2 MG/2ML IJ SOLN
1.0000 mg | INTRAMUSCULAR | Status: DC | PRN
  Administered 2014-11-07 – 2014-11-08 (×5): 1 mg via INTRAVENOUS
  Filled 2014-11-07 (×6): qty 2

## 2014-11-07 MED ORDER — SUCCINYLCHOLINE CHLORIDE 20 MG/ML IJ SOLN
INTRAMUSCULAR | Status: AC
  Filled 2014-11-07: qty 1

## 2014-11-07 MED ORDER — ROCURONIUM BROMIDE 50 MG/5ML IV SOLN
50.0000 mg | Freq: Once | INTRAVENOUS | Status: AC
  Administered 2014-11-07: 50 mg via INTRAVENOUS
  Filled 2014-11-07 (×2): qty 5

## 2014-11-07 MED ORDER — MIDAZOLAM HCL 2 MG/2ML IJ SOLN
5.0000 mg | Freq: Once | INTRAMUSCULAR | Status: AC
  Administered 2014-11-07: 5 mg via INTRAVENOUS
  Filled 2014-11-07: qty 6

## 2014-11-07 MED FILL — Medication: Qty: 1 | Status: AC

## 2014-11-07 NOTE — Progress Notes (Signed)
Inpatient Diabetes Program Recommendations  AACE/ADA: New Consensus Statement on Inpatient Glycemic Control (2013)  Target Ranges:  Prepandial:   less than 140 mg/dL      Peak postprandial:   less than 180 mg/dL (1-2 hours)      Critically ill patients:  140 - 180 mg/dL   Results for TAYGEN, NEWSOME (MRN [id number]) as of 11/07/2014 09:11  Ref. Range 11/06/2014 05:43 11/06/2014 11:14 11/06/2014 16:42 11/06/2014 21:22 11/07/2014 06:47 11/07/2014 07:16  Glucose-Capillary Latest Ref Range: 65-99 mg/dL 204 (H) 247 (H) 282 (H) 236 (H) 224 (H) 278 (H)    Current orders for Inpatient glycemic control: Novolog 0-9 units TID with meals  Inpatient Diabetes Program Recommendations Insulin - Basal: Glucose ranged from 204-282 mg/dl on 11/06/14 and fasting glucose is 224 mg/dl this morning. Please consider ordering low dose basal insulin; recommend starting with Lantus 5 units daily starting now. Correction (SSI): Please consider increasing Novolog correction to moderate scale and adding Novolog bedtime correction scale. Diet: Please consider adding carb modified to current heart healthy diet.  Thanks, Barnie Alderman, RN, MSN, CCRN, CDE Diabetes Coordinator Inpatient Diabetes Program [phone] (Team Pager from [place] to [place]) [phone] ([place] office) [phone] [facility] office) [phone] [facility] office)

## 2014-11-07 NOTE — Progress Notes (Signed)
[place] Progress Note Patient Name: Meagan Campbell DOB: 09/19/1940 MRN: [id number]   Date of Service  11/07/2014  HPI/Events of Note  Myoclonic vs seizure activity. Patient already on Festus per Neurology.  eICU Interventions  Will add Versed 1 mg IV Q 1 hour PRN.     Intervention Category Major Interventions: Seizures - evaluation and management  Lysle Dingwall 11/07/2014, 4:33 PM

## 2014-11-07 NOTE — Progress Notes (Signed)
**Note De-identified Meagan Campbell Obfuscation** Sputum collected and sent to lab 

## 2014-11-07 NOTE — Progress Notes (Signed)
Patient was alert and responsive when administering scheduled synthroidmd to patient at about 0645 this morning. When asked, patient was able to tell nurse that the pill went down, by nodding and stated that it did. Gave patient another swallow of water and patient stated she had enough. After making sure patient was comfortable, nurse left room. Nurse returned about 5min later to check on patient to make sure she was dry for patient is very incontinent. Patient was unresponsive when trying to awake her and was unresponsive to sternum rub and there was no pulse. Immediately performed and started chest compressions and yelled to call a code and get rapid response.Staff members helped and applied zoll pads. RR nurse arrived and fellow doctors that were near the scene came to help. Chest compressions continued on.Code team arrived. Medications given during code to resuscitate patient. Patient did not have a pulse. Code team continued compressions and administering meds. After a while a pulse was found. Bed was assigned and patient was transferred to 2H14. Husband was notified and staff member stated he was on his way.Report given to [facility] nurse who will further care for patient at this time. Nurse signing off   For informational purposes: Patient had a restful night for the most of the night. While rounding on assigned patients, heard patient from the hallway talking and or mumbling kind of loud. Went in and assessed patient. Patient had on CPAP and patient appeared to be dreaming but was able to respond when asked are you okay. Patient stated she didn't feel good. Applied oxygen saturation sensor on patient and oxygen saturations were in the mid 80's with one time dropping to 77% but came back up to 88%. I asked patient if the CPAP was uncomfortable and patient immediately said yes. Took off CPAP and applied nasal cannula with oxygen. Increased oxygen to 4-5 liters of oxygen from 3 liters and kept oxygen saturation  sensor on patient's finger. Oxygen saturations came back up to 93-94%.  Patient was able to continue resting calmly without discomfort.

## 2014-11-07 NOTE — Procedures (Signed)
Arterial Catheter Insertion Procedure Note Joshua Zeringue [id number] [birth year]  Procedure: Insertion of Arterial Catheter  Indications: Blood pressure monitoring  Procedure Details Consent: Unable to obtain consent because of altered level of consciousness. Time Out: Verified patient identification, verified procedure, site/side was marked, verified correct patient position, special equipment/implants available, medications/allergies/relevent history reviewed, required imaging and test results available.  Performed  Maximum sterile technique was used including antiseptics, cap, gloves, gown, hand hygiene, mask and sheet. Skin prep: Chlorhexidine; local anesthetic administered 20 gauge catheter was inserted into right radial artery using the Seldinger technique.  Evaluation Blood flow good; BP tracing good. Complications: No apparent complications.   Jennet Maduro 11/07/2014

## 2014-11-07 NOTE — Code Documentation (Signed)
Code Documentation: Called by our and 07:16 this morning that patient is unresponsive, she is apneic and pulseless. CODE BLUE was called, my colleague Dr. Tyrell Antonio was at bedside when I entered the room. Code team running the ACLS protocol. After 4 rounds of epinephrine patient had return of normal circulation at 07:27. Patient got one round of bicarbonate, IV fluids. Intubated with some blood return, anesthesia felt this is oral trauma secondary to denture displacement. Patient transferred to the ICU. I spoke with Dr. Nelda Marseille, patient will be under PCCM.  Husband coming to the hospital, was not in the unit until the time patient transferred to the ICU.   Birdie Hopes Pager: [phone] 11/07/2014, 7:47 AM

## 2014-11-07 NOTE — Consult Note (Signed)
Reason for Consult: Anoxic injury Referring Physician: Dr Hartford Poli  CC: Cardiac arrest this AM with subsequent anoxic injury.  HPI: Meagan Campbell is a 74 y.o. female with a history of atrial fibrillation, chronic respiratory failure, congestive heart failure, hypertension, hyperlipidemia, COPD on home oxygen therapy, obstructive sleep apnea, and diabetes mellitus, admitted to [facility] on 11/13/2014 for evaluation of lower extremity pain and swelling. The patient was coded on 11/10/2014 at 7:43 AM. Critical care is now involved in the patient's management. The patient was intubated. A CT scan of the head today suggested an anoxic injury. Neurology was asked to see the patient.  Past Medical History  Diagnosis Date  . Anemia   . Edema   . Intention tremor   . Lymphedema   . Atrial fibrillation   . Chronic respiratory failure   . Respiratory failure   . Congestive heart failure   . Insomnia   . Adenomatous colon polyp   . MRSA (methicillin resistant staph aureus) culture positive   . Hypertension   . Proteinuria   . Hyperlipidemia   . Restless legs   . Idiopathic peripheral neuropathy   . Urinary tract infection   . COPD (chronic obstructive pulmonary disease)   . On home oxygen therapy     "2L at night" (08/10/2014)  . PNA (pneumonia) 05/2013; 05/2014  . OSA on CPAP   . Type II diabetes mellitus   . DDD (degenerative disc disease)   . Arthritis     "joints" (08/10/2014    Past Surgical History  Procedure Laterality Date  . Shoulder arthroscopy w/ rotator cuff repair Left   . Abdominal hysterectomy    . Laparoscopic cholecystectomy    . Knee arthroscopy Right   . Cataract extraction w/ intraocular lens  implant, bilateral      Family History  Problem Relation Age of Onset  . Diabetes Sister   . Hypertension Sister   . Cirrhosis Father   . Cancer - Colon Mother   . Cancer Brother     rectal cancer    Social History:  reports that she has never smoked. She  has never used smokeless tobacco. She reports that she does not drink alcohol or use illicit drugs.  Allergies  Allergen Reactions  . Codeine     nausea  . Darvon [Propoxyphene]     nausea    Medications:  Scheduled: . amiodarone  200 mg Oral Daily  . antiseptic oral rinse  7 mL Mouth Rinse QID  . aspirin  325 mg Oral Daily  . atorvastatin  20 mg Oral q1800  . budesonide  0.25 mg Nebulization BID  . carbidopa-levodopa  1 tablet Oral TID  . chlorhexidine  15 mL Mouth Rinse BID  . feeding supplement (VITAL HIGH PROTEIN)  1,000 mL Per Tube Q24H  . ferrous sulfate  325 mg Oral Q breakfast  . guaiFENesin  1,200 mg Oral BID  . insulin aspart  0-9 Units Subcutaneous 6 times per day  . ipratropium-albuterol  3 mL Nebulization QID  . levothyroxine  50 mcg Oral QAC breakfast  . magnesium oxide  200 mg Oral BID  . midazolam  5 mg Intravenous Once  . pantoprazole  40 mg Oral Daily  . piperacillin-tazobactam (ZOSYN)  IV  3.375 g Intravenous 3 times per day  . rocuronium  50 mg Intravenous Once  . sodium chloride  10-40 mL Intracatheter Q12H  . sodium chloride  3 mL Intravenous Q12H  . sodium chloride  3 mL Intravenous Q12H  . vancomycin  1,250 mg Intravenous Q24H    ROS: Unobtainable  Physical Examination: Blood pressure 135/14, pulse 57, temperature 95.9 F (35.5 C), temperature source Oral, resp. rate 18, height 5\' 8"  (1.727 m), weight 121.564 kg (268 lb), SpO2 93 %.   General -  Heart - Regular rate and rhythm - no murmer Lungs - Clear to auscultation Eyes - no scleral injection HEENT: ET tube in place.  Abdomen - Soft - non tender Extremities - Distal pulses intact - no edema Skin - Warm and dry    Neurologic Exam:  MENTAL STATUS: Intubated but not sedated. No purposeful movements. The patient follows no commands. CRANIAL NERVES: pupils equal and reactive to light, no facial asymmetry noted, Corneal reflex present, Dolls eyes absent.  MOTOR: The patient does not move  any extremities spontaneously, or to voice command. She has extension response in her UE bilaterally, but appears to withdraw her L > R lower extermities to noxious stimuli.  SENSORY: Unable to accurately test COORDINATION: Could not be tested REFLEXES: deep tendon reflexes diminished in all areas - no babinski GAIT/STATION: Deferred    Laboratory Studies:   Basic Metabolic Panel:  Recent Labs Lab 11/03/14 0419 11/04/14 0311 11/05/14 0232 11/06/14 0312 11/07/14 0352  NA 145 143 139 145 141  K 3.4* 3.8 3.9 3.9 4.1  CL 99* 96* 95* 97* 97*  CO2 35* 35* 34* 37* 34*  GLUCOSE 149* 157* 191* 225* 232*  BUN 34* 33* 37* 38* 46*  CREATININE 2.23* 2.24* 2.27* 2.17* 2.10*  CALCIUM 8.4* 8.4* 8.5* 9.0 9.0  MG 2.0  --   --   --   --   PHOS  --  3.8  --   --   --     Liver Function Tests:  Recent Labs Lab 11/04/14 0311  ALBUMIN 2.2*   No results for input(s): LIPASE, AMYLASE in the last 168 hours. No results for input(s): AMMONIA in the last 168 hours.  CBC:  Recent Labs Lab 11/01/14 0340 11/02/14 0408 11/05/14 0232 11/06/14 0312 11/07/14 0352  WBC 9.0 8.0 11.4* 11.0* 10.6*  HGB 8.2* 8.2* 8.1* 7.8* 7.4*  HCT 27.1* 27.8* 27.8* 26.9* 26.1*  MCV 90.6 90.8 91.7 91.8 91.6  PLT 167 171 222 210 229    Cardiac Enzymes: No results for input(s): CKTOTAL, CKMB, CKMBINDEX, TROPONINI in the last 168 hours.  BNP: Invalid input(s): POCBNP  CBG:  Recent Labs Lab 11/06/14 1642 11/06/14 2122 11/07/14 0647 11/07/14 0716 11/07/14 1143  GLUCAP 282* 236* 224* 278* 54*    Microbiology: Results for orders placed or performed during the hospital encounter of 11/12/2014  MRSA PCR Screening     Status: None   Collection Time: 11/15/2014  5:17 PM  Result Value Ref Range Status   MRSA by PCR NEGATIVE NEGATIVE Final    Comment:        The GeneXpert MRSA Assay (FDA approved for NASAL specimens only), is one component of a comprehensive MRSA colonization surveillance program. It is  not intended to diagnose MRSA infection nor to guide or monitor treatment for MRSA infections.   Urine culture     Status: None   Collection Time: 11/01/2014  7:02 PM  Result Value Ref Range Status   Specimen Description URINE, CATHETERIZED  Final   Special Requests NONE  Final   Colony Count   Final    60,000 COLONIES/ML Performed at [employer] [facility]  Final    KLEBSIELLA PNEUMONIAE Performed at [facility]    Report Status 11/03/2014 FINAL  Final   Organism ID, Bacteria KLEBSIELLA PNEUMONIAE  Final      Susceptibility   Klebsiella pneumoniae - MIC*    AMPICILLIN >=32 RESISTANT Resistant     CEFAZOLIN <=4 SENSITIVE Sensitive     CEFTRIAXONE <=1 SENSITIVE Sensitive     CIPROFLOXACIN <=0.25 SENSITIVE Sensitive     GENTAMICIN <=1 SENSITIVE Sensitive     LEVOFLOXACIN 1 SENSITIVE Sensitive     NITROFURANTOIN 64 INTERMEDIATE Intermediate     TOBRAMYCIN <=1 SENSITIVE Sensitive     TRIMETH/SULFA <=20 SENSITIVE Sensitive     PIP/TAZO 16 SENSITIVE Sensitive     * KLEBSIELLA PNEUMONIAE  Culture, blood (routine x 2)     Status: None (Preliminary result)   Collection Time: 11/05/14  4:52 PM  Result Value Ref Range Status   Specimen Description BLOOD RIGHT ANTECUBITAL  Final   Special Requests BOTTLES DRAWN AEROBIC AND ANAEROBIC 10CC  Final   Culture   Final           BLOOD CULTURE RECEIVED NO GROWTH TO DATE CULTURE WILL BE HELD FOR 5 DAYS BEFORE ISSUING A FINAL NEGATIVE REPORT Performed at [facility]    Report Status PENDING  Incomplete  Culture, blood (routine x 2)     Status: None (Preliminary result)   Collection Time: 11/05/14  5:15 PM  Result Value Ref Range Status   Specimen Description BLOOD BLOOD RIGHT ARM  Final   Special Requests BOTTLES DRAWN AEROBIC AND ANAEROBIC 10CC  Final   Culture   Final           BLOOD CULTURE RECEIVED NO GROWTH TO DATE CULTURE WILL BE HELD FOR 5 DAYS BEFORE ISSUING A FINAL NEGATIVE REPORT Performed at [facility]    Report Status PENDING  Incomplete    Coagulation Studies: No results for input(s): LABPROT, INR in the last 72 hours.  Urinalysis:  Recent Labs Lab 11/06/14 0608 11/07/14 0815  COLORURINE YELLOW YELLOW  LABSPEC 1.020 1.015  PHURINE 5.5 5.0  GLUCOSEU NEGATIVE NEGATIVE  HGBUR NEGATIVE NEGATIVE  BILIRUBINUR NEGATIVE NEGATIVE  KETONESUR NEGATIVE NEGATIVE  PROTEINUR NEGATIVE NEGATIVE  UROBILINOGEN 0.2 0.2  NITRITE NEGATIVE NEGATIVE  LEUKOCYTESUR MODERATE* MODERATE*    Lipid Panel:     Component Value Date/Time   CHOL 88 11/01/2014 0340   TRIG 127 11/01/2014 0340   HDL 33* 11/01/2014 0340   CHOLHDL 2.7 11/01/2014 0340   VLDL 25 11/01/2014 0340   LDLCALC 30 11/01/2014 0340    HgbA1C:  Lab Results  Component Value Date   HGBA1C 6.3* 11/09/2014    Urine Drug Screen:  No results found for: LABOPIA, COCAINSCRNUR, LABBENZ, AMPHETMU, THCU, LABBARB  Alcohol Level: No results for input(s): ETH in the last 168 hours.    Other results: EKG: Sinus rhythm rate 73 with right bundle branch block and PVC. Please refer to the formal cardiology reading for full details.   Imaging:  Ct Head Wo Contrast 11/07/2014    1. Gray-white differentiation appears mildly compromised, suggesting anoxic injury in this patient status post cardiac arrest.  2. No evidence of acute hemorrhage.  3. Atrophy.      Dg Chest Port 1 View 11/07/2014    1. Right subclavian central line placement without complicating feature.  2. Patchy bilateral airspace disease, right greater than left, possibly due to edema.     Pneumonia is not excluded.  Dg Abd Portable 1v 11/07/2014    Orogastric tube tip is in the antrum of the stomach. Bowel gas pattern is nonobstructive.          Mikey Bussing PA-C [facility] Pager [phone] 11/07/2014, 12:37 PM  I have seen and evaluated the patient. I have reviewed the above note and made appropriate changes.    Assessment/Plan: 74 yo F with likely anoxic injury following acardiac arrest. I suspect anoxic injury, though the extent is still slightly unclear. She has generalized myoclonus which based on the EEG earlier appears to be subcortical. With sharp waves on EEG as well as myoclonus I would favor starting an AED.   1) Keppra 1gm BID 2) will continue to follow.    Roland Rack, MD [facility] [phone]  If 7pm- 7am, please page neurology on call as listed in [place].

## 2014-11-07 NOTE — Anesthesia Procedure Notes (Signed)
Procedure Name: Intubation Date/Time: 11/07/2014 7:30 AM Performed by: Verdie Drown Pre-anesthesia Checklist: Emergency Drugs available, Patient identified, Suction available and Patient being monitored Patient Re-evaluated:Patient Re-evaluated prior to inductionOxygen Delivery Method: Ambu bag Preoxygenation: Pre-oxygenation with 100% oxygen Ventilation: Mask ventilation without difficulty Laryngoscope Size: Mac and 3 Grade View: Grade I Tube type: Subglottic suction tube Tube size: 7.5 mm Number of attempts: 1 Airway Equipment and Method: Stylet and Video-laryngoscopy Placement Confirmation: ETT inserted through vocal cords under direct vision,  positive ETCO2,  CO2 detector and breath sounds checked- equal and bilateral Secured at: 21 cm Tube secured with: Tape Dental Injury: Teeth and Oropharynx as per pre-operative assessment

## 2014-11-07 NOTE — Consult Note (Signed)
PULMONARY / CRITICAL CARE MEDICINE   Name: Meagan Campbell MRN: [id number] DOB: 1941-04-21    ADMISSION DATE:  11/16/2014 CONSULTATION DATE:  11/07/2014  REFERRING MD :  TRH  CHIEF COMPLAINT:  Cardiac arrest and VDRF  INITIAL PRESENTATION: 74 year old chronically ill female with CHF and pulmonary edema who had a cardiac arrest on 6/13 AM that was evidently respiratory in nature as large amount of blood clots from her airway upon intubation so it is presumed that the arrest was pulmonary driven rather than a cardiac event.  Patient was transferred to the ICU and PCCM was consulted.  SIGNIFICANT EVENTS: 6/13 cardiac arrest   HISTORY OF PRESENT ILLNESS:  74 year old chronically ill female with CHF and pulmonary edema who had a cardiac arrest on 6/13 AM that was evidently respiratory in nature as large amount of blood clots from her airway upon intubation so it is presumed that the arrest was pulmonary driven rather than a cardiac event.  Patient was transferred to the ICU and PCCM was consulted.  PAST MEDICAL HISTORY :   has a past medical history of Anemia; Edema; Intention tremor; Lymphedema; Atrial fibrillation; Chronic respiratory failure; Respiratory failure; Congestive heart failure; Insomnia; Adenomatous colon polyp; MRSA (methicillin resistant staph aureus) culture positive; Hypertension; Proteinuria; Hyperlipidemia; Restless legs; Idiopathic peripheral neuropathy; Urinary tract infection; COPD (chronic obstructive pulmonary disease); On home oxygen therapy; PNA (pneumonia) (05/2013; 05/2014); OSA on CPAP; Type II diabetes mellitus; DDD (degenerative disc disease); and Arthritis.  has past surgical history that includes Shoulder arthroscopy w/ rotator cuff repair (Left); Abdominal hysterectomy; Laparoscopic cholecystectomy; Knee arthroscopy (Right); and Cataract extraction w/ intraocular lens  implant, bilateral. Prior to Admission medications   Medication Sig Start Date End Date Taking?  Authorizing Provider  ACCU-CHEK AVIVA PLUS test strip Use as directed   Yes Historical Provider, MD  ALPRAZolam (XANAX) 0.25 MG tablet Take 1 tablet (0.25 mg total) by mouth 3 (three) times daily as needed for anxiety. 08/19/14  Yes Daniel J Angiulli, PA-C  amiodarone (PACERONE) 200 MG tablet Take 1 tablet (200 mg total) by mouth daily. 08/19/14  Yes Daniel J Angiulli, PA-C  ammonium lactate (LAC-HYDRIN) 5 % LOTN lotion Apply 1 application topically 2 (two) times daily. 08/19/14  Yes Daniel J Angiulli, PA-C  aspirin 325 MG tablet Take 1 tablet (325 mg total) by mouth daily. 08/19/14  Yes Daniel J Angiulli, PA-C  Blood Glucose Calibration (ACCU-CHEK AVIVA) SOLN Use as directed   Yes Historical Provider, MD  Blood Glucose Monitoring Suppl (ACCU-CHEK AVIVA PLUS) W/DEVICE KIT Use as directed   Yes Historical Provider, MD  budesonide (PULMICORT) 0.25 MG/2ML nebulizer solution Take 2 mLs (0.25 mg total) by nebulization every 6 (six) hours. 08/12/14  Yes Florencia Reasons, MD  carbidopa-levodopa (SINEMET IR) 25-250 MG per tablet Take 1 tablet by mouth 3 (three) times daily. 08/19/14  Yes Daniel J Angiulli, PA-C  carvedilol (COREG) 6.25 MG tablet Take 6.25 mg by mouth 2 (two) times daily. 09/12/14  Yes Historical Provider, MD  ferrous sulfate 325 (65 FE) MG tablet Take 1 tablet (325 mg total) by mouth daily with breakfast. 08/19/14  Yes Daniel J Angiulli, PA-C  furosemide (LASIX) 80 MG tablet Take 1 tablet (80 mg total) by mouth 2 (two) times daily. 08/19/14  Yes Daniel J Angiulli, PA-C  hydrALAZINE (APRESOLINE) 50 MG tablet Take 50 mg by mouth 3 (three) times daily.   Yes Historical Provider, MD  insulin aspart (NOVOLOG) 100 UNIT/ML injection Inject 4 Units into the  skin 3 (three) times daily with meals. Patient taking differently: Inject 60 Units into the skin 2 (two) times daily with a meal.  08/19/14  Yes Daniel J Angiulli, PA-C  ipratropium-albuterol (DUONEB) 0.5-2.5 (3) MG/3ML SOLN Take 3 mLs by nebulization every 6 (six)  hours. 08/12/14  Yes Florencia Reasons, MD  isosorbide mononitrate (IMDUR) 30 MG 24 hr tablet Take 1 tablet (30 mg total) by mouth daily. 08/19/14  Yes Daniel J Angiulli, PA-C  levothyroxine (SYNTHROID, LEVOTHROID) 50 MCG tablet Take 50 mcg by mouth daily. 09/12/14  Yes Historical Provider, MD  meclizine (ANTIVERT) 25 MG tablet Take 25 mg by mouth 3 (three) times daily as needed for dizziness.   Yes Historical Provider, MD  Melatonin 5 MG TABS Take 1 tablet by mouth at bedtime.    Yes Historical Provider, MD  nystatin (MYCOSTATIN/NYSTOP) 100000 UNIT/GM POWD Apply topically as needed.  05/05/14  Yes Historical Provider, MD  pantoprazole (PROTONIX) 40 MG tablet Take 1 tablet (40 mg total) by mouth daily. 08/19/14  Yes Daniel J Angiulli, PA-C  rOPINIRole (REQUIP) 4 MG tablet 2 tabs at bed time   Yes Historical Provider, MD  simvastatin (ZOCOR) 40 MG tablet Take 1 tablet (40 mg total) by mouth daily. 08/19/14  Yes Daniel J Angiulli, PA-C  ULTILET CLASSIC LANCETS MISC by Does not apply route.   Yes Historical Provider, MD  ipratropium-albuterol (DUONEB) 0.5-2.5 (3) MG/3ML SOLN Take 3 mLs by nebulization 2 (two) times daily.     Historical Provider, MD  lidocaine (LIDODERM) 5 % Place 1 patch onto the skin daily. Remove & Discard patch within 12 hours or as directed by MD Patient not taking: Reported on 11/15/2014 08/19/14   Lavon Paganini Angiulli, PA-C  Magnesium Oxide 400 (240 MG) MG TABS Take 1 tablet by mouth 2 (two) times daily. 06/21/14   Historical Provider, MD  Omega-3 Fatty Acids (FISH OIL CONCENTRATE PO) Take 2 capsules by mouth 2 (two) times daily.     Historical Provider, MD   Allergies  Allergen Reactions  . Codeine     nausea  . Darvon [Propoxyphene]     nausea    FAMILY HISTORY:  has no family status information on file.  SOCIAL HISTORY:  reports that she has never smoked. She has never used smokeless tobacco. She reports that she does not drink alcohol or use illicit drugs.  REVIEW OF SYSTEMS:   Unattainable, patient is intubated and unresponsive post code.  SUBJECTIVE: None  VITAL SIGNS: Temp:  [97.6 F (36.4 C)-97.8 F (36.6 C)] 97.6 F (36.4 C) (06/13 0535) Pulse Rate:  [68-72] 69 (06/13 0535) Resp:  [18-22] 22 (06/13 0535) BP: (122-166)/(31-47) 138/47 mmHg (06/13 0535) SpO2:  [92 %-98 %] 92 % (06/13 0535) FiO2 (%):  [100 %] 100 % (06/13 0753) Weight:  [121.564 kg (268 lb)] 121.564 kg (268 lb) (06/13 0535) HEMODYNAMICS:   VENTILATOR SETTINGS: Vent Mode:  [-] PRVC FiO2 (%):  [100 %] 100 % Set Rate:  [18 bmp] 18 bmp Vt Set:  [500 mL] 500 mL PEEP:  [5 cmH20] 5 cmH20 INTAKE / OUTPUT:  Intake/Output Summary (Last 24 hours) at 11/07/14 0815 Last data filed at 11/07/14 0058  Gross per 24 hour  Intake    243 ml  Output    400 ml  Net   -157 ml    PHYSICAL EXAMINATION: General:  Chronically ill appearing morbidly obese female, unresponsive on the ventilator. Neuro:  Unresponsive, withdraws to pain, tongue with evidence of myoclonus. HEENT:  [place]/AT, PERRL, EOM-spontaneous and MMM Cardiovascular:  RRR, Nl S1/S2, -M/R/G. Lungs:  Crackles diffusely. Abdomen:  Soft, NT, ND and +BS. Musculoskeletal:  2+ edema bilaterally and -tenderness. Skin:  Intact.  LABS:  CBC  Recent Labs Lab 11/05/14 0232 11/06/14 0312 11/07/14 0352  WBC 11.4* 11.0* 10.6*  HGB 8.1* 7.8* 7.4*  HCT 27.8* 26.9* 26.1*  PLT 222 210 229   Coag's No results for input(s): APTT, INR in the last 168 hours. BMET  Recent Labs Lab 11/05/14 0232 11/06/14 0312 11/07/14 0352  NA 139 145 141  K 3.9 3.9 4.1  CL 95* 97* 97*  CO2 34* 37* 34*  BUN 37* 38* 46*  CREATININE 2.27* 2.17* 2.10*  GLUCOSE 191* 225* 232*   Electrolytes  Recent Labs Lab 11/03/14 0419 11/04/14 0311 11/05/14 0232 11/06/14 0312 11/07/14 0352  CALCIUM 8.4* 8.4* 8.5* 9.0 9.0  MG 2.0  --   --   --   --   PHOS  --  3.8  --   --   --    Sepsis Markers No results for input(s): LATICACIDVEN, PROCALCITON, O2SATVEN in  the last 168 hours. ABG No results for input(s): PHART, PCO2ART, PO2ART in the last 168 hours. Liver Enzymes  Recent Labs Lab 11/04/14 0311  ALBUMIN 2.2*   Cardiac Enzymes No results for input(s): TROPONINI, PROBNP in the last 168 hours. Glucose  Recent Labs Lab 11/06/14 0543 11/06/14 1114 11/06/14 1642 11/06/14 2122 11/07/14 0647 11/07/14 0716  GLUCAP 204* 247* 282* 236* 224* 278*    Imaging No results found.   ASSESSMENT / PLAN:  PULMONARY OETT 6/13>>> A:  VDRF - secondary to obstruction / blood clots in airway.   COPD on home O2  OSA on CPAP P:   - Now intubation. - MV support, 8 cc/k/g - ABG now. - Adjust vent for ABG. - F/U CXR for ETT / line confirmation  - PRN albuterol   CARDIOVASCULAR CVL [place] TLC 6/13>>> A:  Cardiac arrest, likely respiratory driven given blood clots. Hypotension - in setting of arrest  Atrial Fibrillation  Acute on Chronic dCHF  HLD P:  - Cycle troponin - ICU monitoring  - Assess CVP Q4 - Levophed for MAP > 65 - Hold lasix, hydralazine, imdur for now  - DNR  - Continue amiodarone  - Lipitor  - ASA   RENAL A:   Acute on Chronic Renal Failure P:   - Trend BMP / UOP  - Replace electrolytes as indicated - Repeat labs now  GASTROINTESTINAL A:   Morbid Obesity  Vent Associated Dysphagia  P:   - NPO - Place OGT - Begin nutrition this evening   HEMATOLOGIC A:   Anemia  P:  - Heparin for DVT prophylaxis - Trend CBC  INFECTIOUS A:   Fever  Klebsiella UTI  RLE Cellulitis  P:   BCx2 6/11 >> UC 6/6 >> klebsiella >> res Amp, interm nitrofurantion, otherwise sens  Sputum 6/13 >>   Vanco, start date 6/11, day 3/x  Zosyn, start date 6/11, day 3/x  ENDOCRINE A:   DM II    P:   SSI   NEUROLOGIC A:   Acute Encephalopathy - in setting of cardiac arrest, approximate downtime 20 minutes P:   - RASS goal: 0 - Minimize sedation as able - STAT CT of the head - EEG   FAMILY  - Updates:  Family  updated at bedside.    The patient is critically ill with multiple organ systems  failure and requires high complexity decision making for assessment and support, frequent evaluation and titration of therapies, application of advanced monitoring technologies and extensive interpretation of multiple databases.   Critical Care Time devoted to patient care services described in this note is  35  Minutes. This time reflects time of care of this signee Dr Jennet Maduro. This critical care time does not reflect procedure time, or teaching time or supervisory time of PA/NP/Med student/Med Resident etc but could involve care discussion time.  Rush Farmer, M.D. [facility] Pulmonary/Critical Care Medicine. Pager: [phone]. After hours pager: [phone].  11/07/2014, 8:15 AM

## 2014-11-07 NOTE — Progress Notes (Signed)
RT called to room due to pt SpO2 in the 80s while on CPAP with 3L bled in. Pt had been placed on [place] by RN per pt request. At this time pt on 4.5L SpO2 94% and no distress. Pt does not wish to go back on CPAP at this time. RN aware.

## 2014-11-07 NOTE — Progress Notes (Signed)
Initial Nutrition Assessment  DOCUMENTATION CODES:  Morbid obesity  INTERVENTION:  Will hold off on TF due to low MAP.  Once MAP is > 60 Initiate Vital High Protein @ 25 ml/hr via OG tube and increase by 10 ml every 4 hours to goal rate of 55 ml/hr.   Tube feeding regimen provides 1620 kcal (13 kcal/kg of actual body weight), 160 grams of protein, and 1103 ml of H2O.    NUTRITION DIAGNOSIS:  Inadequate oral intake related to inability to eat as evidenced by NPO status.   GOAL:  Provide needs based on ASPEN/SCCM guidelines   MONITOR:  Skin, TF tolerance, Labs, Vent status, I & O's  REASON FOR ASSESSMENT:  Consult Enteral/tube feeding initiation and management  ASSESSMENT:  Pt with hx of COPD, CHF admitted with SOB. Pt was in hospital for 7 days and then had cardiac arrest 6/13 which was pulmonary driven. Pt transferred to ICU and intubated. Hypothermia was not started.   Family at bedside, they report pt always has swelling and eats what she wants at home.   MAP has been 49-54 Pt with skin breakdown.   Patient is currently intubated on ventilator support MV: 8.5 L/min Temp (24hrs), Avg:96.2 F (35.7 C), Min:95.9 F (35.5 C), Max:97.8 F (36.6 C)  Spoke with RN, per RN they do not have a MAP goal but are using pressors to keep systolic above 027.   Height:  Ht Readings from Last 1 Encounters:  11/07/14 5\' 8"  (1.727 m)    Weight:  Wt Readings from Last 1 Encounters:  11/07/14 268 lb (121.564 kg)    Ideal Body Weight:  63.6 kg  Wt Readings from Last 10 Encounters:  11/07/14 268 lb (121.564 kg)  09/21/14 293 lb (132.904 kg)  08/19/14 307 lb 8.7 oz (139.5 kg)  08/11/14 280 lb (127.007 kg)  06/28/14 288 lb (130.636 kg)  11/02/13 295 lb 9.6 oz (134.083 kg)  08/10/13 298 lb (135.172 kg)    BMI:  Body mass index is 40.76 kg/(m^2).  Estimated Nutritional Needs:  Kcal:  2536-6440  Protein:  159  Fluid:  >/= 1.5 L/day  Skin:  Wound (see  comment) (cellulitis, stage II buttocks, Deep Tissue Injury buttocks)  Diet Order:    NPO  EDUCATION NEEDS:  No education needs identified at this time   Intake/Output Summary (Last 24 hours) at 11/07/14 1410 Last data filed at 11/07/14 0753  Gross per 24 hour  Intake      3 ml  Output    450 ml  Net   -447 ml    Last BM:  6/10  [place], [place], [place] Pager [phone] After Hours Pager

## 2014-11-07 NOTE — Procedures (Signed)
Central Venous Catheter Insertion Procedure Note Meagan Campbell [id number] 1941/03/28  Procedure: Insertion of Central Venous Catheter Indications: Assessment of intravascular volume, Drug and/or fluid administration and Frequent blood sampling  Procedure Details Consent: Unable to obtain consent because of emergent medical necessity. Time Out: Verified patient identification, verified procedure, site/side was marked, verified correct patient position, special equipment/implants available, medications/allergies/relevent history reviewed, required imaging and test results available.  Performed  Maximum sterile technique was used including antiseptics, cap, gloves, gown, hand hygiene, mask and sheet. Skin prep: Chlorhexidine; local anesthetic administered A antimicrobial bonded/coated triple lumen catheter was placed in the right subclavian vein using the Seldinger technique.  Evaluation Blood flow good Complications: No apparent complications Patient did tolerate procedure well. Chest X-ray ordered to verify placement.  CXR: normal.  U/S used in placement.  Meagan Campbell 11/07/2014, 9:24 AM

## 2014-11-07 NOTE — Progress Notes (Signed)
Pts. Spouse, Shayle Donahoo, informed of patient change in status and to come to hospital as soon as possible.

## 2014-11-07 NOTE — Progress Notes (Signed)
Per MD titrate pressors for a SBP >100

## 2014-11-07 NOTE — Progress Notes (Signed)
EEG completed; results pending.    

## 2014-11-07 NOTE — Procedures (Signed)
ELECTROENCEPHALOGRAM REPORT  Patient: Ladaysha Soutar       Room #: 75-6433 EEG No. ID: [id number] Age: 74 y.o.        Sex: female Referring Physician: Lajean Manes Report Date:  11/07/2014        Interpreting Physician: Anthony Sar  History: Saharra Santo is an 74 y.o. female admitted on 11/18/2014 with progressive shortness of breath associated with CHF, who experienced a cardiac arrest earlier today. She was intubated and placed on mechanical ventilation. She has remained unresponsive and has shown intermittent myoclonic-like jerks.  Indications for study:  Assess severity of encephalopathy; rule out seizure activity.  Technique: This is an 18 channel routine scalp EEG performed at the bedside with bipolar and monopolar montages arranged in accordance to the international 10/20 system of electrode placement.   Description: This EEG was recorded for duration of 93 minutes. The initial 80% of this recording could not be reported due to diffuse muscle contraction and movement artifact. Following administration of 5 mg of Versed, muscle artifact cease and a pattern of severe slowing with burst suppression was demonstrated. Occasional phase reversing sharp waves were recorded from the mid-temporal region. There was no clinical correlation with the occurrences of temporal sharp wave activity. Photic stimulation was not performed  Interpretation: This EEG is abnormal with a pattern of generalized severe slowing of cerebral activity often seen with diffuse hypoxic encephalopathy. Sharp wave discharges recorded from the right temporal region consistent with  probable epileptogenic focus in that region. There was no clinical correlation with focal right temporal sharp wave discharges, however.   Rush Farmer M.D. [facility] [phone]

## 2014-11-07 NOTE — Progress Notes (Signed)
Pt came to unit on Dopamine gtt. Per MD wanted to switch to Levophed. Titrated of the dopamine by 1035 and added levophed gtt. See flowsheets for titrations of levophed. Will continue to assess and monitor pt closely.

## 2014-11-08 ENCOUNTER — Inpatient Hospital Stay (HOSPITAL_COMMUNITY): Payer: Medicare Other

## 2014-11-08 ENCOUNTER — Encounter (HOSPITAL_COMMUNITY): Payer: Self-pay

## 2014-11-08 DIAGNOSIS — I469 Cardiac arrest, cause unspecified: Secondary | ICD-10-CM

## 2014-11-08 DIAGNOSIS — Z515 Encounter for palliative care: Secondary | ICD-10-CM | POA: Insufficient documentation

## 2014-11-08 DIAGNOSIS — I509 Heart failure, unspecified: Secondary | ICD-10-CM

## 2014-11-08 DIAGNOSIS — G931 Anoxic brain damage, not elsewhere classified: Secondary | ICD-10-CM | POA: Insufficient documentation

## 2014-11-08 LAB — BASIC METABOLIC PANEL
Anion gap: 11 (ref 5–15)
BUN: 58 mg/dL — ABNORMAL HIGH (ref 6–20)
CALCIUM: 8.7 mg/dL — AB (ref 8.9–10.3)
CO2: 33 mmol/L — ABNORMAL HIGH (ref 22–32)
Chloride: 100 mmol/L — ABNORMAL LOW (ref 101–111)
Creatinine, Ser: 2.65 mg/dL — ABNORMAL HIGH (ref 0.44–1.00)
GFR calc Af Amer: 19 mL/min — ABNORMAL LOW (ref >60)
GFR calc non Af Amer: 17 mL/min — ABNORMAL LOW (ref >60)
GLUCOSE: 172 mg/dL — AB (ref 65–99)
Potassium: 3.8 mmol/L (ref 3.5–5.1)
Sodium: 144 mmol/L (ref 135–145)

## 2014-11-08 LAB — GLUCOSE, CAPILLARY
Glucose-Capillary: 147 mg/dL — ABNORMAL HIGH (ref 65–99)
Glucose-Capillary: 149 mg/dL — ABNORMAL HIGH (ref 65–99)
Glucose-Capillary: 153 mg/dL — ABNORMAL HIGH (ref 65–99)
Glucose-Capillary: 159 mg/dL — ABNORMAL HIGH (ref 65–99)

## 2014-11-08 LAB — CBC
HEMATOCRIT: 22 % — AB (ref 36.0–46.0)
HEMOGLOBIN: 6.9 g/dL — AB (ref 12.0–15.0)
MCH: 27.5 pg (ref 26.0–34.0)
MCHC: 31.4 g/dL (ref 30.0–36.0)
MCV: 87.6 fL (ref 78.0–100.0)
Platelets: 208 10*3/uL (ref 150–400)
RBC: 2.51 MIL/uL — ABNORMAL LOW (ref 3.87–5.11)
RDW: 17.8 % — ABNORMAL HIGH (ref 11.5–15.5)
WBC: 7.9 10*3/uL (ref 4.0–10.5)

## 2014-11-08 LAB — VANCOMYCIN, TROUGH: Vancomycin Tr: 35 ug/mL (ref 10.0–20.0)

## 2014-11-08 MED ORDER — VALPROATE SODIUM 500 MG/5ML IV SOLN
750.0000 mg | Freq: Three times a day (TID) | INTRAVENOUS | Status: DC
  Administered 2014-11-08: 750 mg via INTRAVENOUS
  Filled 2014-11-08 (×3): qty 7.5

## 2014-11-08 MED ORDER — VITAL HIGH PROTEIN PO LIQD
1000.0000 mL | ORAL | Status: DC
  Administered 2014-11-08: 1000 mL
  Filled 2014-11-08 (×3): qty 1000

## 2014-11-08 MED ORDER — SODIUM CHLORIDE 0.9 % IV SOLN
1.0000 mg/h | INTRAVENOUS | Status: DC
  Administered 2014-11-08: 2 mg/h via INTRAVENOUS
  Filled 2014-11-08: qty 10

## 2014-11-08 MED ORDER — MORPHINE BOLUS VIA INFUSION
5.0000 mg | INTRAVENOUS | Status: DC | PRN
  Filled 2014-11-08: qty 20

## 2014-11-08 MED ORDER — VALPROATE SODIUM 500 MG/5ML IV SOLN
2.0000 g | Freq: Once | INTRAVENOUS | Status: AC
  Administered 2014-11-08: 2000 mg via INTRAVENOUS
  Filled 2014-11-08: qty 20

## 2014-11-09 LAB — CULTURE, RESPIRATORY

## 2014-11-09 LAB — CULTURE, RESPIRATORY W GRAM STAIN

## 2014-11-11 LAB — CULTURE, BLOOD (ROUTINE X 2)
Culture: NO GROWTH
Culture: NO GROWTH

## 2014-11-15 ENCOUNTER — Telehealth: Payer: Self-pay

## 2014-11-15 NOTE — Telephone Encounter (Signed)
On 11/15/2014 I received a death certificate from [facility]. The death certificate is for burial. The patient is a patient of Doctor Nelda Marseille. The death certificate will be taken to [facility] (ICU) unit tomorrow am for Doctor Nelda Marseille to sign the death certificate.  On 11-23-14 I received the death certificate back from Doctor Nelda Marseille. I got the death certificate ready for pick up and called the funeral home to let them know it was ready for pickup. I am putting the death certificate in the mail to the [facility] Department per their request.

## 2014-11-22 ENCOUNTER — Ambulatory Visit: Payer: Medicare Other | Admitting: Critical Care Medicine

## 2014-11-25 NOTE — Progress Notes (Signed)
MD made aware of hemoglobin of 6.9 and decreased urine output.

## 2014-11-25 NOTE — Progress Notes (Signed)
Patient sat dropped to the 50's RT suctioned patient and retrieved a small blood clot. Sat turned back up to 100 to help patient build up some reserves. RT will observe later today to turn FIO2 back down.

## 2014-11-25 NOTE — Procedures (Signed)
History: 74 yo F s/p cardiac arrest with myoclonic activity  Sedation: depakote, keppra  Technique: This is a 19 channel routine scalp EEG performed at the bedside with bipolar and monopolar montages arranged in accordance to the international 10/20 system of electrode placement. One channel was dedicated to EKG recording.    Background: The background is markedly abnormal consisting of bursts of generalied sharp activity interspersed with brief periods of generalized attenuation. This pattern is persistent througout the recording.   Photic stimulation: Physiologic driving is not perofrmed  EEG Abnormalities: 1) Status epilepticus  Clinical Interpretation: This EEG is consistent with status epilepticus. Combined with the clinical history of myoclonus this is consistent with post-hypoxic myoclonic status epilepticus and is associated with a dismal prognosis in this clinical setting.   Roland Rack, MD [facility] [phone]  If 7pm- 7am, please page neurology on call as listed in [place].

## 2014-11-25 NOTE — Procedures (Signed)
Extubation Procedure Note  Patient Details:   Name: Meagan Campbell DOB: 1940/12/14 MRN: [id number]   Airway Documentation:  Airway 7.5 mm (Active)  Secured at (cm) 22 cm November 22, 2014  4:55 PM  Measured From Lips November 22, 2014  4:55 PM  [place] 2014/11/22  4:55 PM  Secured By [employer] November 22, 2014  4:55 PM  Tube Holder Repositioned Yes 11/22/14 11:11 AM  Site Condition Dry 2014-11-22  4:55 PM    Evaluation  O2 sats: stable throughout Complications: No apparent complications Patient did tolerate procedure well. Bilateral Breath Sounds: Diminished Suctioning: Oral, Airway No   Patient was terminally extubated to a 2 lpm nasal cannula. Family and RN at bedside.  Tiburcio Bash Nov 22, 2014, 5:14 PM

## 2014-11-25 NOTE — Progress Notes (Signed)
Patient was transferred to [facility] after a cardiac arrest.  Original d/c plan was for placement at [facility]- however, new CSW indicates need for comfort care. CSW had updated admissions at [facility] yesterday of change in condition. CSW will support patient/ family as indicated for comfort care.  Lorie Phenix. Pauline Good, [place]

## 2014-11-25 NOTE — Progress Notes (Signed)
EEG completed; results pending.    

## 2014-11-25 NOTE — Progress Notes (Signed)
Pt passed away at 1824 with family and RN at bedside.

## 2014-11-25 NOTE — Progress Notes (Signed)
Nutrition Follow-up  DOCUMENTATION CODES:  Morbid obesity  INTERVENTION:  Initiate Vital High Protein @ 25 ml/hr via OG tube and increase by 10 ml every 4 hours to goal rate of 55 ml/hr.   Tube feeding regimen provides 1620 kcal (13 kcal/kg of actual body weight), 160 grams of protein, and 1103 ml of H2O.   NUTRITION DIAGNOSIS:  Inadequate oral intake related to inability to eat as evidenced by NPO status.  ongoing  GOAL:  Provide needs based on ASPEN/SCCM guidelines  Not yet met  MONITOR:  Skin, TF tolerance, Labs, Vent status, I & O's  REASON FOR ASSESSMENT:  Consult Enteral/tube feeding initiation and management  ASSESSMENT:  Pt with hx of COPD, CHF admitted with SOB. Pt was in hospital for 7 days and then had cardiac arrest 6/13 which was pulmonary driven. Pt transferred to ICU and intubated. Hypothermia was not started.  Patient is currently intubated on ventilator support MV: 8.5 L/min Temp (24hrs), Avg:98 F (36.7 C), Min:96.8 F (36 C), Max:99.5 F (37.5 C)  Spoke with MD who is ok with starting TF with lower MAP.  Medications reviewed and include: ferrous sulfate and mag ox   Height:  Ht Readings from Last 1 Encounters:  11/07/14 $RemoveB'5\' 8"'QkPDoDIo$  (1.727 m)    Weight:  Wt Readings from Last 1 Encounters:  12-07-14 276 lb (125.193 kg)    Ideal Body Weight:  63.6 kg  Wt Readings from Last 10 Encounters:  2014/12/07 276 lb (125.193 kg)  09/21/14 293 lb (132.904 kg)  08/19/14 307 lb 8.7 oz (139.5 kg)  08/11/14 280 lb (127.007 kg)  06/28/14 288 lb (130.636 kg)  11/02/13 295 lb 9.6 oz (134.083 kg)  08/10/13 298 lb (135.172 kg)    BMI:  Body mass index is 41.98 kg/(m^2).  Estimated Nutritional Needs:  Kcal:  1336-1701  Protein:  159 grams  Fluid:  >/= 1.5 L/day  Skin:   (cellulitis, stage II buttocks, Deep Tissue Injury buttocks)  Diet Order:    NPO  EDUCATION NEEDS:  No education needs identified at this time   Intake/Output Summary (Last  24 hours) at 12/07/14 1600 Last data filed at 2014/12/07 1500  Gross per 24 hour  Intake 1300.74 ml  Output    535 ml  Net 765.74 ml    Last BM:  6/10  [place], [place], [place] Pager [phone] After Hours Pager

## 2014-11-25 NOTE — Progress Notes (Signed)
Inpatient Diabetes Program Recommendations  AACE/ADA: New Consensus Statement on Inpatient Glycemic Control (2013)  Target Ranges:  Prepandial:   less than 140 mg/dL      Peak postprandial:   less than 180 mg/dL (1-2 hours)      Critically ill patients:  140 - 180 mg/dL   Results for Meagan Campbell, Meagan Campbell (MRN [id number]) as of Dec 01, 2014 10:28  Ref. Range 11/07/2014 06:47 11/07/2014 07:16 11/07/2014 11:43 11/07/2014 15:32 11/07/2014 19:37 12/01/14 00:13 12/01/14 04:04 Dec 01, 2014 07:51  Glucose-Capillary Latest Ref Range: 65-99 mg/dL 224 (H) 278 (H) 328 (H) 268 (H) 182 (H) 159 (H) 149 (H) 147 (H)   Current orders for Inpatient glycemic control: Novolog 0-9 units Q4H  Inpatient Diabetes Program Recommendations Insulin - Basal: Glucose ranged from 147-328 mg/dl over the past 24 hours and patient has received a total of Novolog 22 units for correction. Also noted that patient was just started on tube feedings this morning which is likely to further elevated glucose levels.  Please consider ordering low dose basal insulin; recommend starting with Lantus 7 units daily starting now. Insulin - Meal Coverage: May want to consider ordering tube feeding coverage if CBGs become consistently elevated greater than 180 mg/dl with addition of tube feeds.   Thanks, Barnie Alderman, RN, MSN, CCRN, CDE Diabetes Coordinator Inpatient Diabetes Program [phone] (Team Pager from [place] to [place]) [phone] ([place] office) [phone] [facility] office) [phone] [facility] office)

## 2014-11-25 NOTE — Progress Notes (Signed)
200 mL of Fentanyl wasted with another RN Alphia Kava, RN

## 2014-11-25 NOTE — Progress Notes (Signed)
Patient tolerated CT trip well. Sat's stable throughout the trip.

## 2014-11-25 NOTE — Progress Notes (Signed)
Spoke with patient's family extensively.  Discussed the entire case but mainly the EEG results and neuro recommendations.  The patient has extensive brain damage per neuro's assessment.  I conveyed this information to the patient's family.  They informed me that they have been expecting this for 3 years as she has been very ill.  After discussion and answering multiple question decision was made to proceed with withdrawal now.  Withdrawal order set in place.  Morphine will be started and patient will be extubated shortly there after.  Family wishes to remain in the room post extubation.  Discussed with RN for plan of care.  The patient is critically ill with multiple organ systems failure and requires high complexity decision making for assessment and support, frequent evaluation and titration of therapies, application of advanced monitoring technologies and extensive interpretation of multiple databases.   Critical Care Time devoted to patient care services described in this note is  60  Minutes. This time reflects time of care of this signee Dr Jennet Maduro. This critical care time does not reflect procedure time, or teaching time or supervisory time of PA/NP/Med student/Med Resident etc but could involve care discussion time.  Rush Farmer, M.D. [facility] Pulmonary/Critical Care Medicine. Pager: [phone]. After hours pager: [phone].

## 2014-11-25 NOTE — Progress Notes (Addendum)
ANTIBIOTIC CONSULT NOTE - FOLLOW UP  Pharmacy Consult for Vancomycin and Zosyn Indication: Klebsiella uti, RLE cellulitis, fever  Allergies  Allergen Reactions  . Codeine     nausea  . Darvon [Propoxyphene]     nausea    Patient Measurements: Height: 5\' 8"  (172.7 cm) Weight: 276 lb (125.193 kg) IBW/kg (Calculated) : 63.9 Actual weight:  275 lbs (122.5 kg) on admit 6/6.  Vital Signs: Temp: 97.2 F (36.2 C) (06/14 1500) Temp Source: Core (Comment) (06/14 1200) BP: 150/29 mmHg (06/14 0847) Pulse Rate: 58 (06/14 1500) Intake/Output from previous day: 06/13 0701 - 06/14 0700 In: 1298.2 [I.V.:388.2; IV Piggyback:910] Out: 400 [Urine:400]  Labs:  Recent Labs  11/06/14 0312 11/07/14 0352 11/07/14 0914 11/12/2014 0400  WBC 11.0* 10.6*  --  7.9  HGB 7.8* 7.4*  --  6.9*  PLT 210 229  --  208  CREATININE 2.17* 2.10* 2.47* 2.65*   Microbiology: Recent Results (from the past 720 hour(s))  MRSA PCR Screening     Status: None   Collection Time: 11/23/2014  5:17 PM  Result Value Ref Range Status   MRSA by PCR NEGATIVE NEGATIVE Final    Comment:        The GeneXpert MRSA Assay (FDA approved for NASAL specimens only), is one component of a comprehensive MRSA colonization surveillance program. It is not intended to diagnose MRSA infection nor to guide or monitor treatment for MRSA infections.   Urine culture     Status: None   Collection Time: 10/30/2014  7:02 PM  Result Value Ref Range Status   Specimen Description URINE, CATHETERIZED  Final   Special Requests NONE  Final   Colony Count   Final    60,000 COLONIES/ML Performed at [facility]    Culture   Final    KLEBSIELLA PNEUMONIAE Performed at [facility]    Report Status 11/03/2014 FINAL  Final   Organism ID, Bacteria KLEBSIELLA PNEUMONIAE  Final      Susceptibility   Klebsiella pneumoniae - MIC*    AMPICILLIN >=32 RESISTANT Resistant     CEFAZOLIN <=4 SENSITIVE Sensitive     CEFTRIAXONE <=1  SENSITIVE Sensitive     CIPROFLOXACIN <=0.25 SENSITIVE Sensitive     GENTAMICIN <=1 SENSITIVE Sensitive     LEVOFLOXACIN 1 SENSITIVE Sensitive     NITROFURANTOIN 64 INTERMEDIATE Intermediate     TOBRAMYCIN <=1 SENSITIVE Sensitive     TRIMETH/SULFA <=20 SENSITIVE Sensitive     PIP/TAZO 16 SENSITIVE Sensitive     * KLEBSIELLA PNEUMONIAE  Culture, blood (routine x 2)     Status: None (Preliminary result)   Collection Time: 11/05/14  4:52 PM  Result Value Ref Range Status   Specimen Description BLOOD RIGHT ANTECUBITAL  Final   Special Requests BOTTLES DRAWN AEROBIC AND ANAEROBIC 10CC  Final   Culture   Final           BLOOD CULTURE RECEIVED NO GROWTH TO DATE CULTURE WILL BE HELD FOR 5 DAYS BEFORE ISSUING A FINAL NEGATIVE REPORT Performed at [facility]    Report Status PENDING  Incomplete  Culture, blood (routine x 2)     Status: None (Preliminary result)   Collection Time: 11/05/14  5:15 PM  Result Value Ref Range Status   Specimen Description BLOOD BLOOD RIGHT ARM  Final   Special Requests BOTTLES DRAWN AEROBIC AND ANAEROBIC 10CC  Final   Culture   Final  BLOOD CULTURE RECEIVED NO GROWTH TO DATE CULTURE WILL BE HELD FOR 5 DAYS BEFORE ISSUING A FINAL NEGATIVE REPORT Performed at [facility]    Report Status PENDING  Incomplete  Culture, respiratory (NON-Expectorated)     Status: None (Preliminary result)   Collection Time: 11/07/14 10:32 AM  Result Value Ref Range Status   Specimen Description TRACHEAL ASPIRATE  Final   Special Requests NONE  Final   Gram Stain   Final    MODERATE WBC PRESENT,BOTH PMN AND MONONUCLEAR NO SQUAMOUS EPITHELIAL CELLS SEEN NO ORGANISMS SEEN Performed at [facility]    Culture   Final    Non-Pathogenic Oropharyngeal-type Flora Isolated. Performed at [facility]    Report Status PENDING  Incomplete  MRSA PCR Screening     Status: None   Collection Time: 11/07/14 10:38 AM  Result Value Ref Range Status    MRSA by PCR NEGATIVE NEGATIVE Final    Comment:        The GeneXpert MRSA Assay (FDA approved for NASAL specimens only), is one component of a comprehensive MRSA colonization surveillance program. It is not intended to diagnose MRSA infection nor to guide or monitor treatment for MRSA infections.    Assessment:  Infectious Disease: was on Vanc/Zosyn for LLE cellulitis (due to edema and chronic venous stasis)and possibly UTI. UCx grew Klebsiella and chg'd to CTX. Vanc/Zosyn resumed 6/11 with fevers. Currently afeb, WBC 7.9. Scr trending up 2.1>2.4>2.65 today. UOP down 0.55ml/kg/hr.  With increasing scr will check VT prior to next vancomycin dose this afternoon.   Vanc 6/6>>6/9; resume 6/11>> Zosyn 6/6>>6/9; resume 6/11>> CTX 6/9>>6/11  6/6 UCx: 60 K/ml KLEBSIELLA PNEUMONIAE S Ancef 6/11 Blood x 2 -ngtd 6/13 trach aspirate - ngtd   Goal of Therapy:  Vancomycin trough level 15-20 mcg/ml appropriate Zosyn dose for renal function and infection  Plan:   Continue Vancomycin 1250 mg IV q24hrs but checking level this afternoon  Continue Zosyn 3.375 gm IV q8hrs (each over 4 hours) for now  Follow renal function, culture data, progress.  Erin Hearing PharmD., BCPS Clinical Pharmacist Pager [phone] Nov 22, 2014 3:42 PM  Addendum:  Vancomycin level is 35. Will cancel today's dose and follow up patient's progress in am.  11-22-2014 3:43 PM

## 2014-11-25 NOTE — Progress Notes (Signed)
Subjective: Has had more frequent myclonus.   Exam: Filed Vitals:   2014-11-09 0847  BP: 150/29  Pulse: 73  Temp:   Resp: 20   Gen: In bed, NAD MS: eyes partially open YI:FOYDX, blinking too frequently due to myoclonus for reliable corneal relfex Motor: extension bilateral arms, flexion bilateral legs to nox stim.  Sensory:as above.   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 I feel that in her situation this is a poor prognostic indicator.   Recommendations: 1) Repeat CT, EEG 2) If EEG shows status epilepticus, I would not favor proceeding to generalized anesthesias(e.g. With propofol or pentobarbital) as this is a dismal prognostic indicator.  3) Further care to the extent of family wishes based on above testing.   Roland Rack, MD [facility] [phone]  If 7pm- 7am, please page neurology on call as listed in [place].

## 2014-11-25 NOTE — Progress Notes (Signed)
Daughter- Meagan Campbell took pts earrings at this time.

## 2014-11-25 NOTE — Consult Note (Signed)
PULMONARY / CRITICAL CARE MEDICINE   Name: Meagan Campbell MRN: [id number] DOB: 03-30-1941    ADMISSION DATE:  11/12/2014 CONSULTATION DATE:  11/07/2014  REFERRING MD :  TRH  CHIEF COMPLAINT:  Cardiac arrest and VDRF  INITIAL PRESENTATION: 74 year old chronically ill female with CHF and pulmonary edema who had a cardiac arrest on 6/13 AM that was evidently respiratory in nature as large amount of blood clots from her airway upon intubation so it is presumed that the arrest was pulmonary driven rather than a cardiac event.  Patient was transferred to the ICU and PCCM was consulted.  SIGNIFICANT EVENTS: 6/13 cardiac arrest   HISTORY OF PRESENT ILLNESS:  74 year old chronically ill female with CHF and pulmonary edema who had a cardiac arrest on 6/13 AM that was evidently respiratory in nature as large amount of blood clots from her airway upon intubation so it is presumed that the arrest was pulmonary driven rather than a cardiac event.  Patient was transferred to the ICU and PCCM was consulted.  SUBJECTIVE: No events overnight, EEG with status  VITAL SIGNS: Temp:  [95.9 F (35.5 C)-99.5 F (37.5 C)] 99.1 F (37.3 C) (06/14 1100) Pulse Rate:  [57-73] 59 (06/14 1111) Resp:  [13-33] 18 (06/14 1111) BP: (126-150)/(29-30) 150/29 mmHg (06/14 0847) SpO2:  [89 %-98 %] 98 % (06/14 1111) FiO2 (%):  [50 %-100 %] 100 % (06/14 1111) Weight:  [125.193 kg (276 lb)] 125.193 kg (276 lb) (06/14 0407)   HEMODYNAMICS: CVP:  [4 mmHg-19 mmHg] 7 mmHg   VENTILATOR SETTINGS: Vent Mode:  [-] PRVC FiO2 (%):  [50 %-100 %] 100 % Set Rate:  [18 bmp] 18 bmp Vt Set:  [500 mL] 500 mL PEEP:  [5 cmH20] 5 cmH20 Plateau Pressure:  [27 PYP95-09 cmH20] 28 cmH20   INTAKE / OUTPUT:  Intake/Output Summary (Last 24 hours) at 19-Nov-2014 1216 Last data filed at Nov 19, 2014 1151  Gross per 24 hour  Intake 1628.24 ml  Output    535 ml  Net 1093.24 ml   PHYSICAL EXAMINATION: General:  Chronically ill appearing morbidly  obese female, unresponsive on the ventilator. Neuro:  Unresponsive, does not withdraw to pain at this point. HEENT:  [place]/AT, PERRL, EOM-spontaneous and MMM. Cardiovascular:  RRR, Nl S1/S2, -M/R/G. Lungs:  Crackles diffusely. Abdomen:  Soft, NT, ND and +BS. Musculoskeletal:  2+ edema bilaterally and -tenderness. Skin:  Intact.  LABS:  CBC  Recent Labs Lab 11/06/14 0312 11/07/14 0352 11-19-14 0400  WBC 11.0* 10.6* 7.9  HGB 7.8* 7.4* 6.9*  HCT 26.9* 26.1* 22.0*  PLT 210 229 208   Coag's No results for input(s): APTT, INR in the last 168 hours. BMET  Recent Labs Lab 11/07/14 0352 11/07/14 0914 2014/11/19 0400  NA 141 140 144  K 4.1 4.4 3.8  CL 97* 98* 100*  CO2 34* 29 33*  BUN 46* 51* 58*  CREATININE 2.10* 2.47* 2.65*  GLUCOSE 232* 350* 172*   Electrolytes  Recent Labs Lab 11/03/14 0419 11/04/14 0311  11/07/14 0352 11/07/14 0914 11-19-14 0400  CALCIUM 8.4* 8.4*  < > 9.0 8.6* 8.7*  MG 2.0  --   --   --   --   --   PHOS  --  3.8  --   --   --   --   < > = values in this interval not displayed. Sepsis Markers No results for input(s): LATICACIDVEN, PROCALCITON, O2SATVEN in the last 168 hours. ABG  Recent Labs Lab 11/07/14  1050  PHART 7.391  PCO2ART 46.6*  PO2ART 58.4*   Liver Enzymes  Recent Labs Lab 11/04/14 0311  ALBUMIN 2.2*   Cardiac Enzymes  Recent Labs Lab 11/07/14 0914 11/07/14 1516 11/07/14 2214  TROPONINI 0.12* 0.15* 0.14*   Glucose  Recent Labs Lab 11/07/14 1532 11/07/14 1937 11-20-14 0013 11/20/2014 0404 11/20/2014 0751 11/20/2014 1154  GLUCAP 268* 182* 159* 149* 147* 153*    Imaging Ct Head Wo Contrast  20-Nov-2014   CLINICAL DATA:  74 year old hypertensive female with atrial fibrillation post cardiac arrest. Spasmodic jerking. Subsequent encounter.  EXAM: CT HEAD WITHOUT CONTRAST  TECHNIQUE: Contiguous axial images were obtained from the base of the skull through the vertex without intravenous contrast.  COMPARISON:  11/07/2014  and 01/02/2014 head CT. No comparison brain MR.  FINDINGS: No intracranial hemorrhage.  No CT evidence of large acute infarct.  Gray-white differentiation and deep gray matter structures still well visualized without definitive evidence of diffuse anoxia.  No hydrocephalus.  No intracranial mass lesion noted on this unenhanced exam.  Patient is intubated.  Minimal mucosal thickening right maxillary sinus and ethmoid sinus air cells.  IMPRESSION: No intracranial hemorrhage.  No CT evidence of large acute infarct.  Gray-white differentiation and deep gray matter structures still well visualized without definitive evidence of diffuse anoxia.   Electronically Signed   By: Genia Del M.D.   On: November 20, 2014 11:23   Dg Abd Portable 1v  11/07/2014   CLINICAL DATA:  Orogastric tube placement  EXAM: PORTABLE ABDOMEN - 1 VIEW  COMPARISON:  11/07/2014  FINDINGS: Orogastric tube tip is in the stomach antrum.  Various wires project over the left upper quadrant.  Prominence of gas and stool noted in the colon. Clips in the right upper quadrant, probably from cholecystectomy.  Suspected airspace opacity at both lung bases, right greater than left.  IMPRESSION: 1. Orogastric tube: Stomach antrum. 2.  Prominent stool throughout the colon favors constipation. 3. Suspected mild bibasilar airspace opacities in the lungs.   Electronically Signed   By: Van Clines M.D.   On: 11/07/2014 14:03     ASSESSMENT / PLAN:  PULMONARY OETT 6/13>>> A:  VDRF - secondary to obstruction / blood clots in airway.   COPD on home O2  OSA on CPAP P:   - MV support, 8 cc/k/g - ABG now. - Adjust vent for ABG. - F/U CXR for ETT / line confirmation  - PRN albuterol  - No extubation, need to discuss plan of care with family.  CARDIOVASCULAR CVL R [place] TLC 6/13>>> A:  Cardiac arrest, likely respiratory driven given blood clots. Hypotension - in setting of arrest  Atrial Fibrillation  Acute on Chronic dCHF  HLD P:  - ICU  monitoring  - Assess CVP Q4 - Levophed for MAP > 65 - Hold lasix, hydralazine, imdur for now  - DNR  - Continue amiodarone  - Lipitor  - ASA   RENAL A:   Acute on Chronic Renal Failure P:   - Trend BMP / UOP  - Replace electrolytes as indicated - Repeat labs now  GASTROINTESTINAL A:   Morbid Obesity  Vent Associated Dysphagia  P:   - PPI - TF per nutrition.  HEMATOLOGIC A:   Anemia  P:  - Heparin for DVT prophylaxis - Trend CBC  INFECTIOUS A:   Fever  Klebsiella UTI  RLE Cellulitis  P:   BCx2 6/11 >> UC 6/6 >> klebsiella >> res Amp, interm nitrofurantion, otherwise sens  Sputum  6/13 >>   Vanco, start date 6/11, day 4/x  Zosyn, start date 6/11, day 4/x  ENDOCRINE A:   DM II    P:   SSI   NEUROLOGIC A:   Acute Encephalopathy - in setting of cardiac arrest, approximate downtime 20 minutes P:   - RASS goal: 0 - Minimize sedation as able - CT of the head without a bleed - EEG with status - Prognosis is very poor, will meet with family and recommend withdrawal.  FAMILY  - Updates:  No family bedside.  The patient is critically ill with multiple organ systems failure and requires high complexity decision making for assessment and support, frequent evaluation and titration of therapies, application of advanced monitoring technologies and extensive interpretation of multiple databases.   Critical Care Time devoted to patient care services described in this note is  35  Minutes. This time reflects time of care of this signee Dr Jennet Maduro. This critical care time does not reflect procedure time, or teaching time or supervisory time of PA/NP/Med student/Med Resident etc but could involve care discussion time.  Rush Farmer, M.D. [facility] Pulmonary/Critical Care Medicine. Pager: [phone]. After hours pager: [phone].  12-07-14, 12:16 PM

## 2014-11-25 NOTE — Progress Notes (Signed)
PT Cancellation Note  Patient Details Name: Meagan Campbell MRN: [id number] DOB: [birth year]   Cancelled Treatment:    Reason Eval/Treat Not Completed: Medical issues which prohibited therapy (pt with medical decline and intubation. Will sign off and await new order)   Melford Aase 10-Nov-2014, 7:06 AM Elwyn Reach, [place]

## 2014-11-25 DEATH — deceased

## 2014-12-06 NOTE — Discharge Summary (Signed)
NAMEJAYLA, MACKIE NO.:  [id number]  MEDICAL RECORD NO.:  [id number]  LOCATION:  0G84A                        FACILITY:  [facility]  PHYSICIAN:  Providence Lanius, MD  DATE OF BIRTH:  21-Nov-1940  DATE OF ADMISSION:  11/04/2014 DATE OF DISCHARGE:  November 25, 2014                              DISCHARGE SUMMARY   DEATH SUMMARY.  PRIMARY DIAGNOSIS/CAUSE OF DEATH:  Anoxic brain injury.  SECONDARY DIAGNOSES:  Cardiac arrest; ventilator-dependent respiratory failure; anoxic encephalopathy; chronic obstructive pulmonary disease, oxygen dependent; cardiogenic shock; atrial fibrillation; acute-on- chronic renal failure; Klebsiella urinary tract infection; right lower extremity cellulitis; type 2 diabetes.  The patient is a 74 year old female with extensive past medical history, who presented to [facility] on October 31, 2014, with congestive heart failure exacerbation, had a cardiac arrest event since November 07, 2014, that was evidently respiratory in nature, likely driven by her pulmonary edema.  The patient had a prolonged resuscitation, during which, she suffered anoxic brain injury.  The patient was transferred to the intensive care unit and treated accordingly.  On 2014-11-25; however, EEG findings confirmed anoxic encephalopathy, CT was also confirmed that.  I spoke with Neurology, who felt the prognosis is very poor.  I have met with the family and informed that the patient has been very ill.  They have been expecting her to die for the last 3 years since her illness had been deteriorating.  After discussion with family and relaying information From Neurology, informed that the patient would not want that level of care.  She would never want to be alive with such debilitated state, at which point, morphine was started.  The ventilator was withdrawn for the patient to expire comfortably with the family at bedside.     Providence Lanius, MD     WJY/MEDQ  D:   12/05/2014  T:  12/06/2014  Job:  [id number]

## 2017-01-18 IMAGING — CR DG CHEST 1V PORT
1 series · 1 of 1 positions shown · non-contrast
Comparison: Multiple prior plain film, most recent 08/05/2014,
08/05/2014, 08/04/2014

CLINICAL DATA: 74-year-old female, history of endotracheal tube
placement

EXAM:
PORTABLE CHEST - 1 VIEW

[AP]
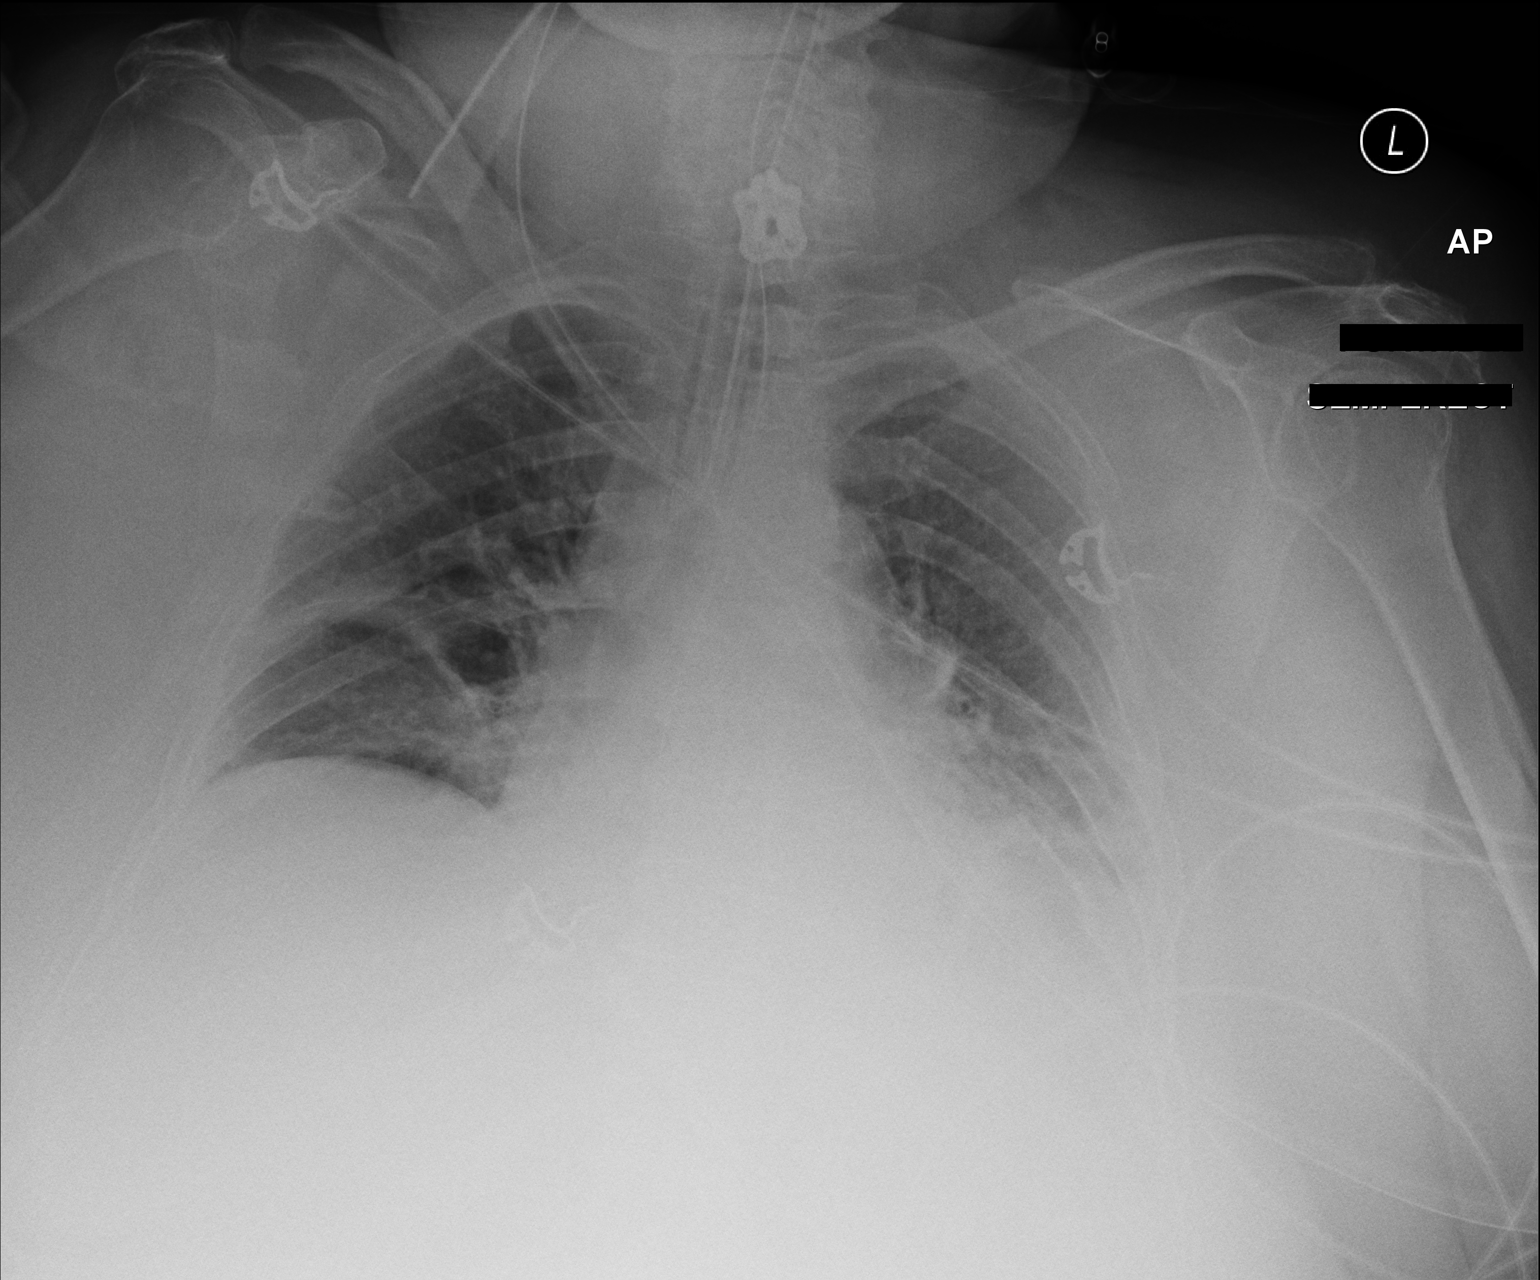

[1 of 1 positions shown; findings below may reference images not displayed]

FINDINGS: Cardiomediastinal silhouette likely unchanged 0 partially obscured
by overlying lung and pleural disease.

Lung volumes are low accentuating the interstitium the central
vasculature.

Fluid/ atelectasis along the minor fissure. Retrocardiac opacity
with blunting of left costophrenic angle on obscuration left
hemidiaphragm.

No evidence of pneumothorax.

Unchanged position of endotracheal tube terminating 2.5 cm above the
carina.

Unchanged position of enteric tube.

Surgical changes of prior cervical fixation again noted.
IMPRESSION: Unchanged appearance of the chest x-ray, with low lung volumes.
Persistent left greater than right airspace disease may reflect
consolidation, atelectasis, and/or pleural effusion.

Unchanged endotracheal tube, terminating 2.5 cm above the carina.
Unchanged gastric tube.
